# Patient Record
Sex: Female | Born: 1955 | Race: White | Hispanic: No | Marital: Single | Smoking: Former smoker
Health system: Southern US, Community
[De-identification: ages and names within clinical notes are randomized; demographics above are authoritative.]

## PROBLEM LIST (undated history)

## (undated) DIAGNOSIS — M1990 Unspecified osteoarthritis, unspecified site: Secondary | ICD-10-CM

## (undated) DIAGNOSIS — E559 Vitamin D deficiency, unspecified: Secondary | ICD-10-CM

## (undated) DIAGNOSIS — I10 Essential (primary) hypertension: Secondary | ICD-10-CM

## (undated) DIAGNOSIS — E785 Hyperlipidemia, unspecified: Secondary | ICD-10-CM

## (undated) DIAGNOSIS — R7303 Prediabetes: Secondary | ICD-10-CM

## (undated) DIAGNOSIS — K219 Gastro-esophageal reflux disease without esophagitis: Secondary | ICD-10-CM

## (undated) HISTORY — PX: APPENDECTOMY: SHX54

## (undated) HISTORY — DX: Unspecified osteoarthritis, unspecified site: M19.90

## (undated) HISTORY — DX: Hyperlipidemia, unspecified: E78.5

## (undated) HISTORY — DX: Prediabetes: R73.03

## (undated) HISTORY — DX: Essential (primary) hypertension: I10

## (undated) HISTORY — DX: Gastro-esophageal reflux disease without esophagitis: K21.9

## (undated) HISTORY — DX: Vitamin D deficiency, unspecified: E55.9

---

## 1997-08-14 ENCOUNTER — Other Ambulatory Visit: Admission: RE | Admit: 1997-08-14 | Discharge: 1997-08-14 | Payer: Self-pay | Admitting: Obstetrics and Gynecology

## 1997-09-05 ENCOUNTER — Other Ambulatory Visit: Admission: RE | Admit: 1997-09-05 | Discharge: 1997-09-05 | Payer: Self-pay | Admitting: Obstetrics and Gynecology

## 1999-02-06 ENCOUNTER — Emergency Department (HOSPITAL_COMMUNITY): Admission: EM | Admit: 1999-02-06 | Discharge: 1999-02-06 | Payer: Self-pay | Admitting: Emergency Medicine

## 1999-05-11 ENCOUNTER — Emergency Department (HOSPITAL_COMMUNITY): Admission: EM | Admit: 1999-05-11 | Discharge: 1999-05-11 | Payer: Self-pay | Admitting: *Deleted

## 2002-07-18 ENCOUNTER — Encounter: Payer: Self-pay | Admitting: Internal Medicine

## 2002-07-18 ENCOUNTER — Encounter: Admission: RE | Admit: 2002-07-18 | Discharge: 2002-07-18 | Payer: Self-pay | Admitting: Internal Medicine

## 2007-12-25 ENCOUNTER — Ambulatory Visit: Payer: Self-pay | Admitting: Pulmonary Disease

## 2007-12-25 DIAGNOSIS — J90 Pleural effusion, not elsewhere classified: Secondary | ICD-10-CM | POA: Insufficient documentation

## 2007-12-25 DIAGNOSIS — J309 Allergic rhinitis, unspecified: Secondary | ICD-10-CM | POA: Insufficient documentation

## 2008-12-10 ENCOUNTER — Other Ambulatory Visit: Admission: RE | Admit: 2008-12-10 | Discharge: 2008-12-10 | Payer: Self-pay | Admitting: Internal Medicine

## 2009-01-01 ENCOUNTER — Ambulatory Visit: Payer: Self-pay | Admitting: Gastroenterology

## 2009-02-05 LAB — HM COLONOSCOPY

## 2009-03-13 ENCOUNTER — Encounter: Admission: RE | Admit: 2009-03-13 | Discharge: 2009-03-13 | Payer: Self-pay | Admitting: Internal Medicine

## 2009-03-13 LAB — HM DEXA SCAN

## 2009-03-13 LAB — HM MAMMOGRAPHY: HM Mammogram: NORMAL

## 2010-08-18 IMAGING — CR DG CHEST 2V
2 series · 2 of 2 positions shown · non-contrast
Comparison: None

CLINICAL DATA: Cough, smoking history

CHEST - 2 VIEW

[view not recorded (1 of 2)]
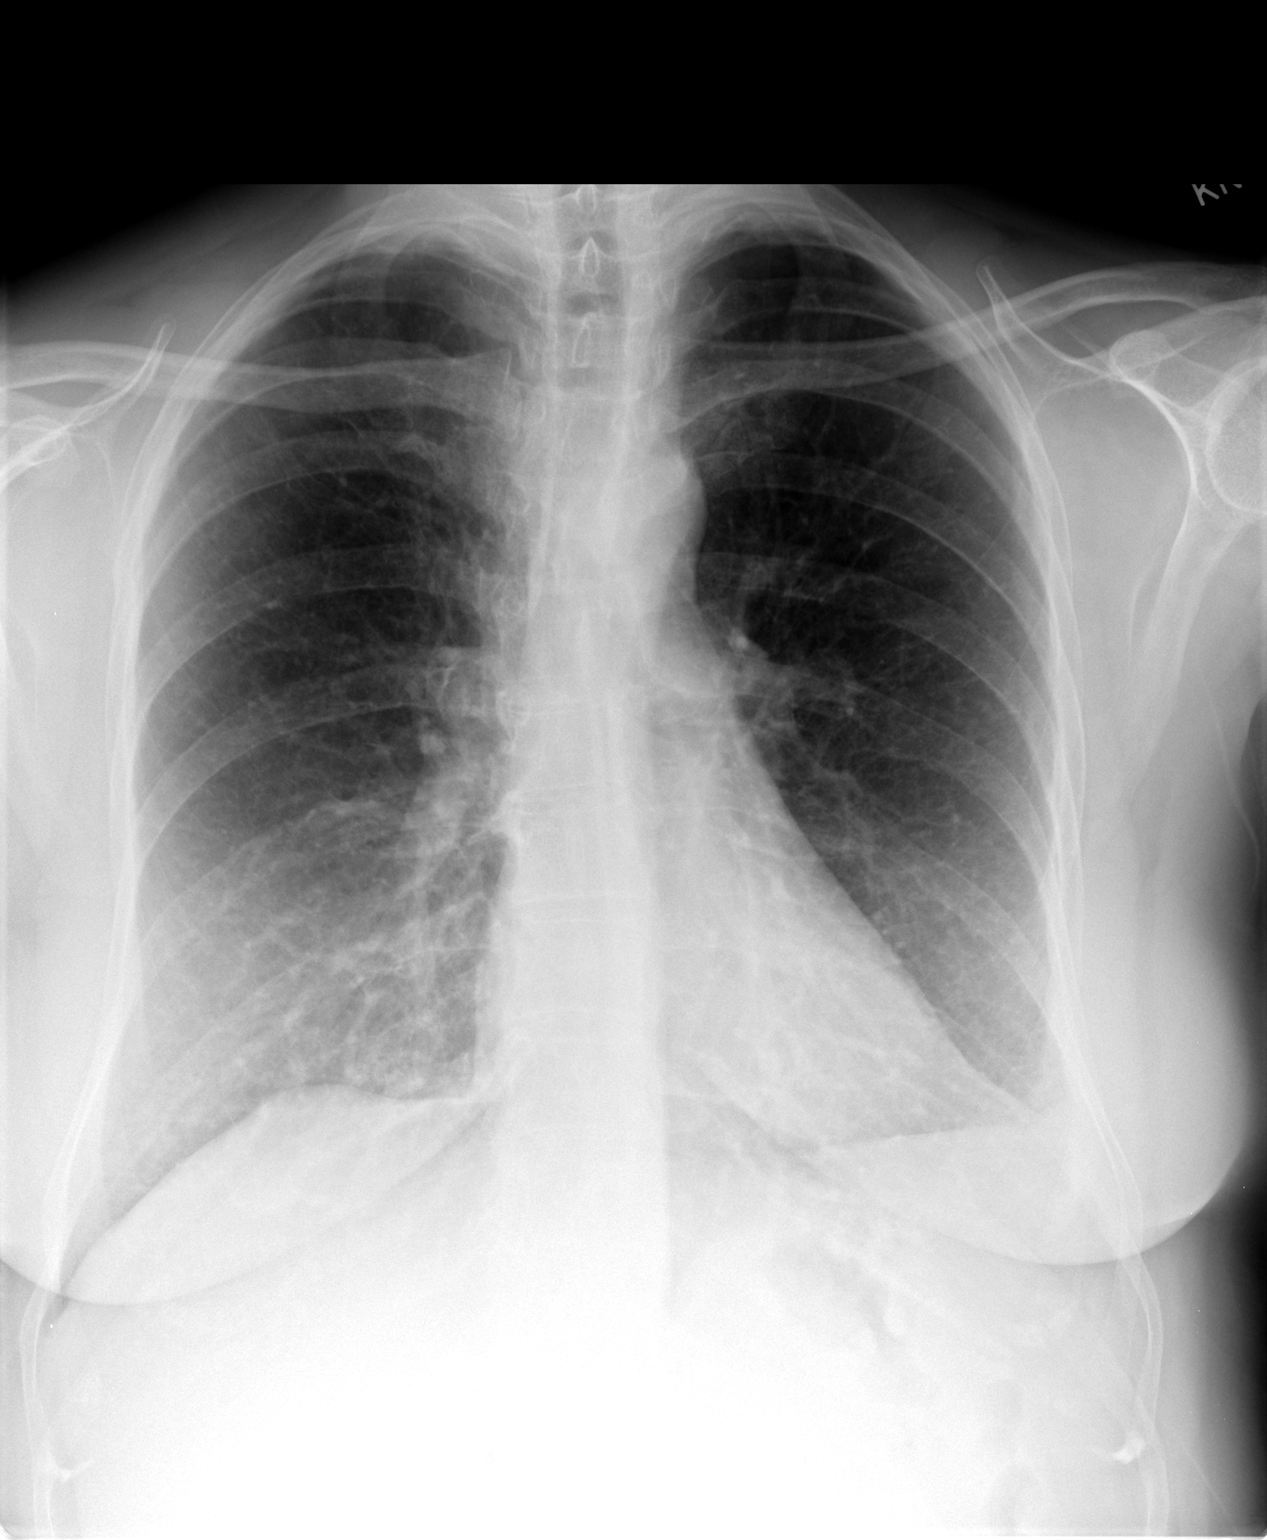

[view not recorded (2 of 2)]
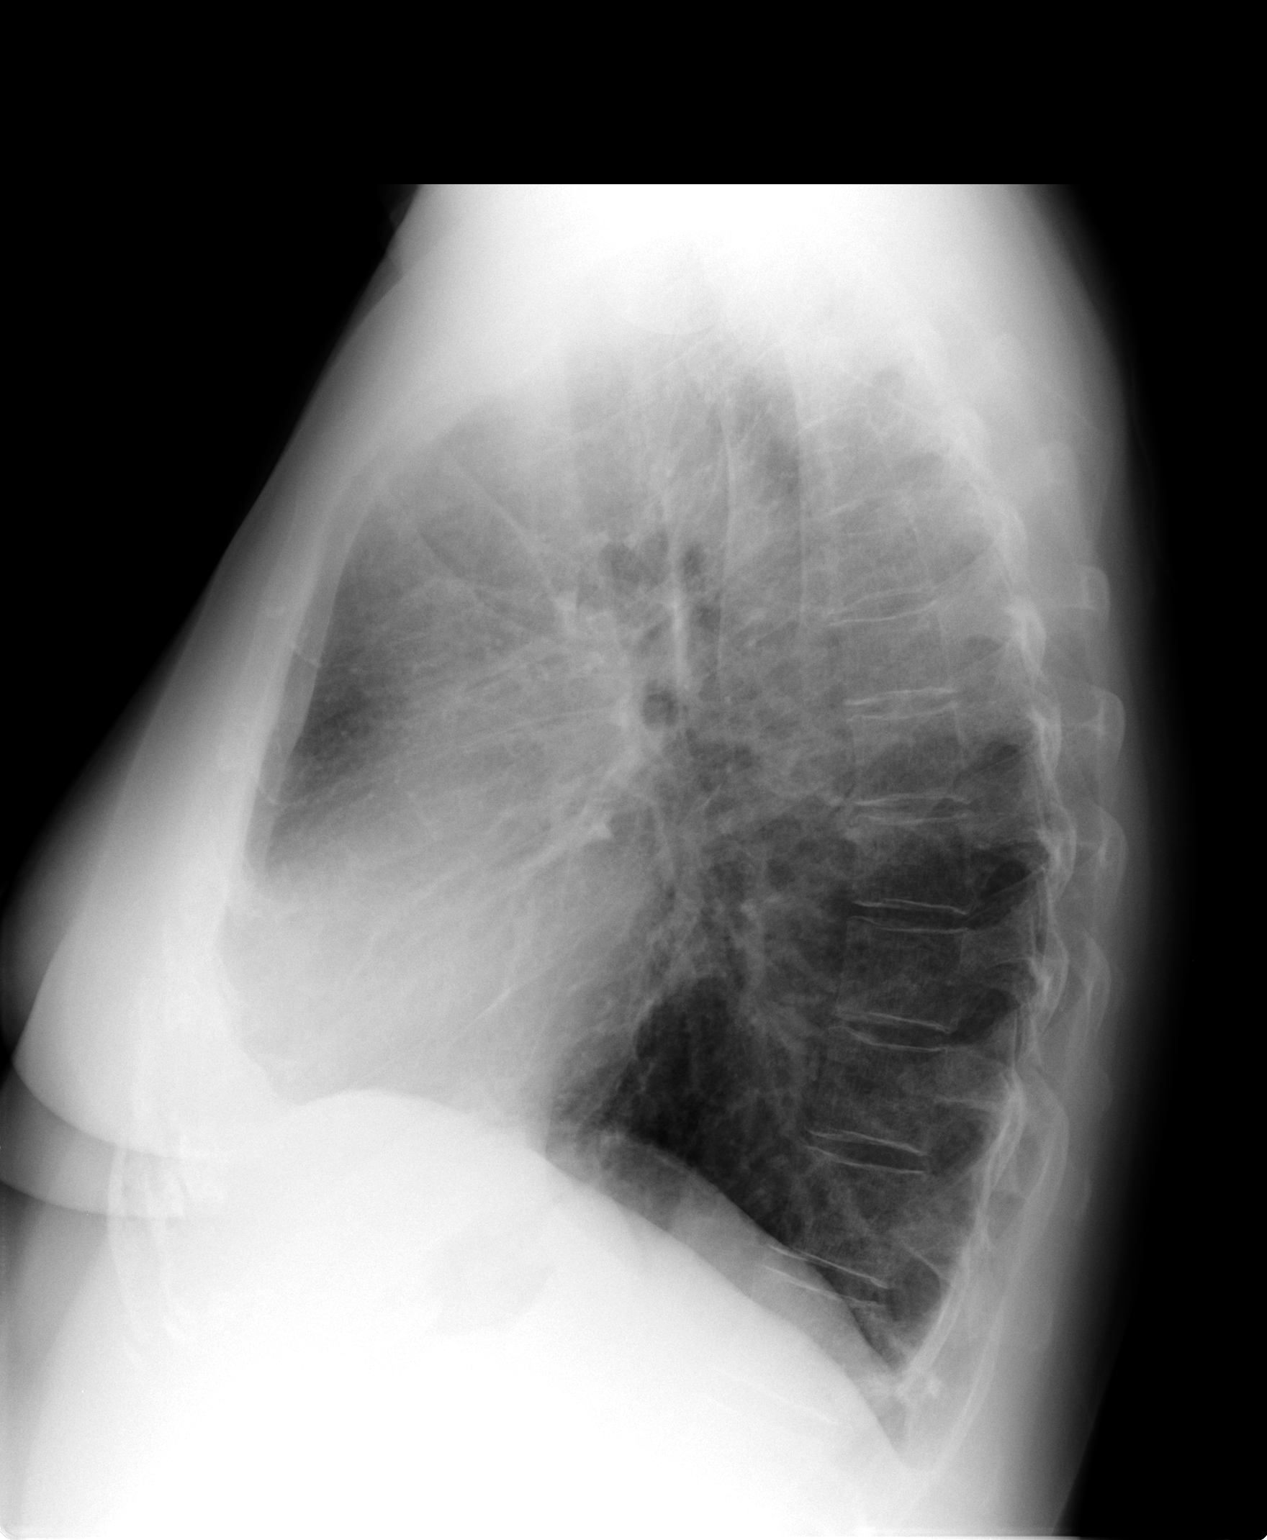

[2 of 2 positions shown; findings below may reference images not displayed]

FINDINGS: Two views of the chest show the lungs to be clear and
slightly hyperaerated.  The heart is within normal limits in size.
No bony abnormality is seen.
IMPRESSION: Slight hyperaeration.  No active lung disease.

## 2013-05-06 DIAGNOSIS — R7303 Prediabetes: Secondary | ICD-10-CM | POA: Insufficient documentation

## 2013-05-06 DIAGNOSIS — E559 Vitamin D deficiency, unspecified: Secondary | ICD-10-CM | POA: Insufficient documentation

## 2013-05-06 DIAGNOSIS — I10 Essential (primary) hypertension: Secondary | ICD-10-CM | POA: Insufficient documentation

## 2013-05-06 DIAGNOSIS — K219 Gastro-esophageal reflux disease without esophagitis: Secondary | ICD-10-CM | POA: Insufficient documentation

## 2013-05-06 DIAGNOSIS — E785 Hyperlipidemia, unspecified: Secondary | ICD-10-CM | POA: Insufficient documentation

## 2013-05-09 ENCOUNTER — Encounter: Payer: Self-pay | Admitting: Internal Medicine

## 2016-02-18 ENCOUNTER — Other Ambulatory Visit: Payer: Self-pay | Admitting: Family

## 2016-02-18 DIAGNOSIS — Z1231 Encounter for screening mammogram for malignant neoplasm of breast: Secondary | ICD-10-CM

## 2016-07-15 DIAGNOSIS — Z8719 Personal history of other diseases of the digestive system: Secondary | ICD-10-CM | POA: Insufficient documentation

## 2016-07-15 DIAGNOSIS — R5383 Other fatigue: Secondary | ICD-10-CM | POA: Insufficient documentation

## 2016-07-15 DIAGNOSIS — Z79899 Other long term (current) drug therapy: Secondary | ICD-10-CM | POA: Insufficient documentation

## 2016-07-15 DIAGNOSIS — M0579 Rheumatoid arthritis with rheumatoid factor of multiple sites without organ or systems involvement: Secondary | ICD-10-CM | POA: Insufficient documentation

## 2016-07-15 DIAGNOSIS — F5101 Primary insomnia: Secondary | ICD-10-CM | POA: Insufficient documentation

## 2016-07-15 DIAGNOSIS — R768 Other specified abnormal immunological findings in serum: Secondary | ICD-10-CM | POA: Insufficient documentation

## 2016-07-15 NOTE — Progress Notes (Signed)
Office Visit Note  Patient: Emma Warner             Date of Birth: 06-Mar-1956           MRN: [id number]             PCP: Lizbeth Bark, NP Referring: Smothers, Cathleen Corti, NP Visit Date: 07/16/2016 Occupation: @GUAROCC @    Subjective:  Pain in multiple joints   History of Present Illness: Emma Warner is a 61 y.o. female seen in consultation per request of her PCP. According to patient in July 2017 while she was walking her dog she did had a pole on her right shoulder. She was seen by orthopedic surgeons who suspected partial rotator cuff tear. She was given a cortisone injection and the symptoms gradually settle down but did not completely resolve. In October 2017 she started experiencing increased joint pain and swelling in her right wrist joint. She was given prednisone on and off for that. In November 2017 while she was driving to [place] she has increased joint pain and discomfort in her hands and her feet and had developed swelling on her bilateral lower extremities for which she had to get compression stockings. By this time she was also experiencing some neck pain. When she came back to [place] she saw her PCP and was given a prednisone Dosepak. She also had an appointment with Dr. Kellie Simmering who after evaluation started her on prednisone and gave her shingles vaccine. Methotrexate was added in February 2017 she was initially on 6 tablets per week and increase it to 8 tablets per week about 6 weeks ago. She has noticed some improvement in her symptoms. She still has discomfort in her hands and feet. She also complains of fatigue. There is a strong family history of rheumatoid arthritis. She is currently recovering from an upper respiratory tract infection  Activities of Daily Living:  Patient reports morning stiffness for 1 hour.   Patient Denies nocturnal pain.  Difficulty dressing/grooming: Denies Difficulty climbing stairs: Denies Difficulty getting out of chair:  Denies Difficulty using hands for taps, buttons, cutlery, and/or writing: Denies   Review of Systems  Constitutional: Positive for fatigue. Negative for night sweats, weight gain, weight loss and weakness.  HENT: Negative for mouth sores, trouble swallowing, trouble swallowing, mouth dryness and nose dryness.   Eyes: Negative for pain, redness, visual disturbance and dryness.  Respiratory: Negative for cough, shortness of breath and difficulty breathing.   Cardiovascular: Negative for chest pain, palpitations, hypertension, irregular heartbeat and swelling in legs/feet.  Gastrointestinal: Negative for blood in stool, constipation and diarrhea.  Endocrine: Negative for increased urination.  Genitourinary: Negative for vaginal dryness.  Musculoskeletal: Positive for arthralgias, joint pain, joint swelling and morning stiffness. Negative for myalgias, muscle weakness, muscle tenderness and myalgias.  Skin: Negative for color change, rash, hair loss, skin tightness, ulcers and sensitivity to sunlight.  Allergic/Immunologic: Negative for susceptible to infections.  Neurological: Negative for dizziness, memory loss and night sweats.  Hematological: Negative for swollen glands.  Psychiatric/Behavioral: Positive for sleep disturbance. Negative for depressed mood. The patient is not nervous/anxious.     PMFS History:  Patient Active Problem List   Diagnosis Date Noted  . Primary osteoarthritis of both hands 07/16/2016  . Rheumatoid arthritis involving multiple sites with positive rheumatoid factor (HCC) 07/15/2016  . ANA positive 07/15/2016  . History of gastroesophageal reflux (GERD) 07/15/2016  . Ds DNA antibody positive 07/15/2016  . Other fatigue 07/15/2016  .  High risk medication use 07/15/2016  . Primary insomnia 07/15/2016  . Hypertension   . Hyperlipidemia   . GERD (gastroesophageal reflux disease)   . Prediabetes   . Vitamin D deficiency   . ALLERGIC RHINITIS 12/25/2007  .  PLEURAL EFFUSION 12/25/2007    Past Medical History:  Diagnosis Date  . Arthritis   . GERD (gastroesophageal reflux disease)   . Hyperlipidemia   . Hypertension   . Prediabetes   . Vitamin D deficiency     Family History  Problem Relation Age of Onset  . Diabetes Mother   . Cancer Father     colon  . Cirrhosis Brother     alcoholic   Past Surgical History:  Procedure Laterality Date  . APPENDECTOMY     Social History   Social History Narrative  . No narrative on file     Objective: Vital Signs: BP 130/74 (BP Location: Right Arm)   Pulse 74   Resp 14   Ht 5\' 7"  (1.702 m)   Wt 170 lb (77.1 kg)   BMI 26.63 kg/m    Physical Exam  Constitutional: She is oriented to person, place, and time. She appears well-developed and well-nourished.  HENT:  Head: Normocephalic and atraumatic.  Eyes: Conjunctivae and EOM are normal.  Neck: Normal range of motion.  Cardiovascular: Normal rate, regular rhythm, normal heart sounds and intact distal pulses.   Pulmonary/Chest: Effort normal and breath sounds normal.  Abdominal: Soft. Bowel sounds are normal.  Lymphadenopathy:    She has no cervical adenopathy.  Neurological: She is alert and oriented to person, place, and time.  Skin: Skin is warm and dry. Capillary refill takes less than 2 seconds.  Psychiatric: She has a normal mood and affect. Her behavior is normal.  Nursing note and vitals reviewed.    Musculoskeletal Exam: C-spine and thoracic lumbar spine good range of motion. Shoulder joints although joints wrist joint MCPs PIPs DIPs are good range of motion with no synovitis. She is some prominence of bilateral CMC and DIP PIP joints consistent with osteoarthritis. Hip joints knee joints ankles MTPs PIPs DIPs with good range of motion with no synovitis. She is prominence of bilateral first MTP and some PIP/DIP joints in her feet consistent with osteoarthritis.  CDAI Exam: CDAI Homunculus Exam:   Joint Counts:  CDAI  Tender Joint count: 0 CDAI Swollen Joint count: 0  Global Assessments:  Patient Global Assessment: 3 Provider Global Assessment: 3  CDAI Calculated Score: 6    Investigation: Findings:  02/13/2016 lipid panel LDL 142 HDL 65 ,TSHnormal 1.610; Hep C Ab Non reactive; Uric Acid 5.1;Sed rate 30; CBC shows WBC 11.7; UA normal; positive ANA (no titer) ds DNA Ab positive 99 (otherwise ENA panel negative)  Patient got x-rays from Dr. Ines Bloomer office I do not have a date on it x-ray of the right foot shows right first MTP PIP/DIP narrowing no other MTP narrowing or erosive changes were noted. X-ray of bilateral hands from 02/23/2016 showed mild PIP/DIP narrowing no MCP changes noted erosive changes, no intercarpal joint space changes.    Imaging: No results found.  Speciality Comments: No specialty comments available.    Procedures:  No procedures performed Allergies: Patient has no known allergies.   Assessment / Plan:     Visit Diagnoses: Rheumatoid arthritis involving multiple sites with positive rheumatoid factor (HCC) -patient has been treated by Dr. Kellie Simmering and has been on methotrexate 8 tablets per week along with prednisone. She had no  synovitis on examination today. I would like to taper her prednisone. We had detailed discussion regarding that she is currently on 5 mg by mouth daily I've advised her to decrease her prednisone to 2.5 mg by mouth daily for a month and then 2.5 mg every other day and discontinue. If she has any flare of her symptoms she supposed to notify us. I discussed the option of subcutaneous methotrexate or adding Enbrel in case she has a flare. Her uncle is currently taking Enbrel and is quite pleased with that. Plan: Rheumatoid factor, Cyclic citrul peptide antibody, IgG  High risk medication use - Methotrexate 8 tabs po q wk, folic acid 1 mg po qd, prednisone5 mg po qd -I have advised to increase her dose of folic acid to 2 mg by mouth daily. I'll obtain some  baseline labs today. We will also get informed consent on methotrexate today. Plan: CBC with Differential/Platelet, COMPLETE METABOLIC PANEL WITH GFR, Quantiferon tb gold assay (blood), Serum protein electrophoresis with reflex, HIV antibody, Hepatitis B core antibody, IgM, Hepatitis B surface antigen, CBC with Differential/Platelet, COMPLETE METABOLIC PANEL WITH GFR  Primary osteoarthritis of both hands: She has mild changes which cause some discomfort.  Primary osteoarthritis of both feet: She is some discomfort in her feet as well.  ANA positive: She does not have any clinical features of lupus  Ds DNA antibody positive: She is significantly high titer of double-stranded DNA. At this time I do not see any other clinical features of lupus. - Plan: Lupus anticoagulant panel, Beta-2 glycoprotein antibodies, Cardiolipin antibodies, IgG, IgM, IgA  Other fatigue - Plan: Anti-DNA antibody, double-stranded, C3 and C4, CK, IgG, IgA, IgM  Primary insomnia [place] good sleep hygiene was discussed.  History of gastroesophageal reflux (GERD)  Vitamin D deficiency  History of hypertension  History of pleural effusion /2009 - related to pneumonia  Family history of rheumatoid arthritis - Mother, maternal uncle 2 second cousins    Orders: Orders Placed This Encounter  Procedures  . CBC with Differential/Platelet  . COMPLETE METABOLIC PANEL WITH GFR  . Rheumatoid factor  . Cyclic citrul peptide antibody, IgG  . Quantiferon tb gold assay (blood)  . Serum protein electrophoresis with reflex  . Anti-DNA antibody, double-stranded  . C3 and C4  . Lupus anticoagulant panel  . Beta-2 glycoprotein antibodies  . Cardiolipin antibodies, IgG, IgM, IgA  . CK  . IgG, IgA, IgM  . HIV antibody  . Hepatitis B core antibody, IgM  . Hepatitis B surface antigen  . CBC with Differential/Platelet  . COMPLETE METABOLIC PANEL WITH GFR   Meds ordered this encounter  Medications  . folic acid (FOLVITE) 1 MG  tablet    Sig: Take 2 tablets (2 mg total) by mouth daily.    Dispense:  180 tablet    Refill:  3  . predniSONE (DELTASONE) 5 MG tablet    Sig: Take 2.5 mg PO QD for one month, then 2.5 mg PO every other day for one month, then discontinue    Dispense:  45 tablet    Refill:  0    Face-to-face time spent with patient was 60 minutes. 50% of time was spent in counseling and coordination of care.  Follow-Up Instructions: Return for Rheumatoid arthritis.   Pollyann SavoyShaili Akon Reinoso, MD  Note - This record has been created using [employer] software.  Chart creation errors have been sought, but may not always  have been located. Such creation errors do not reflect on  the  standard of medical care. 

## 2016-07-16 ENCOUNTER — Encounter: Payer: Self-pay | Admitting: Rheumatology

## 2016-07-16 ENCOUNTER — Ambulatory Visit (INDEPENDENT_AMBULATORY_CARE_PROVIDER_SITE_OTHER): Payer: 59 | Admitting: Rheumatology

## 2016-07-16 VITALS — BP 130/74 | HR 74 | Resp 14 | Ht 67.0 in | Wt 170.0 lb

## 2016-07-16 DIAGNOSIS — F5101 Primary insomnia: Secondary | ICD-10-CM

## 2016-07-16 DIAGNOSIS — R5383 Other fatigue: Secondary | ICD-10-CM | POA: Diagnosis not present

## 2016-07-16 DIAGNOSIS — R768 Other specified abnormal immunological findings in serum: Secondary | ICD-10-CM | POA: Diagnosis not present

## 2016-07-16 DIAGNOSIS — Z8719 Personal history of other diseases of the digestive system: Secondary | ICD-10-CM | POA: Diagnosis not present

## 2016-07-16 DIAGNOSIS — Z8679 Personal history of other diseases of the circulatory system: Secondary | ICD-10-CM

## 2016-07-16 DIAGNOSIS — E559 Vitamin D deficiency, unspecified: Secondary | ICD-10-CM

## 2016-07-16 DIAGNOSIS — Z8709 Personal history of other diseases of the respiratory system: Secondary | ICD-10-CM | POA: Diagnosis not present

## 2016-07-16 DIAGNOSIS — Z79899 Other long term (current) drug therapy: Secondary | ICD-10-CM | POA: Diagnosis not present

## 2016-07-16 DIAGNOSIS — M19072 Primary osteoarthritis, left ankle and foot: Secondary | ICD-10-CM

## 2016-07-16 DIAGNOSIS — M19071 Primary osteoarthritis, right ankle and foot: Secondary | ICD-10-CM | POA: Diagnosis not present

## 2016-07-16 DIAGNOSIS — M19042 Primary osteoarthritis, left hand: Secondary | ICD-10-CM

## 2016-07-16 DIAGNOSIS — M0579 Rheumatoid arthritis with rheumatoid factor of multiple sites without organ or systems involvement: Secondary | ICD-10-CM

## 2016-07-16 DIAGNOSIS — Z8261 Family history of arthritis: Secondary | ICD-10-CM | POA: Diagnosis not present

## 2016-07-16 DIAGNOSIS — M19041 Primary osteoarthritis, right hand: Secondary | ICD-10-CM | POA: Insufficient documentation

## 2016-07-16 LAB — CBC WITH DIFFERENTIAL/PLATELET
BASOS ABS: 0 {cells}/uL (ref 0–200)
Basophils Relative: 0 %
EOS ABS: 92 {cells}/uL (ref 15–500)
Eosinophils Relative: 1 %
HEMATOCRIT: 39.3 % (ref 35.0–45.0)
HEMOGLOBIN: 13.2 g/dL (ref 11.7–15.5)
Lymphocytes Relative: 15 %
Lymphs Abs: 1380 cells/uL (ref 850–3900)
MCH: 30.1 pg (ref 27.0–33.0)
MCHC: 33.6 g/dL (ref 32.0–36.0)
MCV: 89.5 fL (ref 80.0–100.0)
MPV: 10 fL (ref 7.5–12.5)
Monocytes Absolute: 460 cells/uL (ref 200–950)
Monocytes Relative: 5 %
Neutro Abs: 7268 cells/uL (ref 1500–7800)
Neutrophils Relative %: 79 %
Platelets: 377 10*3/uL (ref 140–400)
RBC: 4.39 MIL/uL (ref 3.80–5.10)
RDW: 15.7 % — ABNORMAL HIGH (ref 11.0–15.0)
WBC: 9.2 10*3/uL (ref 3.8–10.8)

## 2016-07-16 MED ORDER — FOLIC ACID 1 MG PO TABS: 2.0000 mg | ORAL_TABLET | Freq: Every day | ORAL | 3 refills | Status: DC

## 2016-07-16 MED ORDER — PREDNISONE 5 MG PO TABS: ORAL_TABLET | ORAL | 0 refills | Status: DC

## 2016-07-16 NOTE — Patient Instructions (Addendum)
Decrease prednisone dose to 2.5 mg (1/2 of a 5 mg tablet) daily for one month, then reduce to 2.5 mg every other day for a month, then stop.   Continue methotrexate 8 tablets (20 mg) weekly  Increase folic acid dose to 2 mg (2 tablets) daily  Standing Labs We placed an order today for your standing lab work.    Please come back and get your standing labs in August and every 3 months  We have open lab Monday through Friday from 8:30-11:30 AM and 1:30-4 PM at the office of Dr. Arbutus PedShaili Morad Tal/Naitik Panwala, PA.   The office is located at [address], [address], [place], [place] [address] No appointment is necessary.   Labs are drawn by [facility].  You may receive a bill from [facility] for your lab work.    Methotrexate tablets What is this medicine? METHOTREXATE (METH oh TREX ate) is a chemotherapy drug used to treat cancer including breast cancer, leukemia, and lymphoma. This medicine can also be used to treat psoriasis and certain kinds of arthritis. This medicine may be used for other purposes; ask your health care provider or pharmacist if you have questions. COMMON BRAND NAME(S): Rheumatrex, Trexall What should I tell my health care provider before I take this medicine? They need to know if you have any of these conditions: -fluid in the stomach area or lungs -if you often drink alcohol -infection or immune system problems -kidney disease or on hemodialysis -liver disease -low blood counts, like low white cell, platelet, or red cell counts -lung disease -radiation therapy -stomach ulcers -ulcerative colitis -an unusual or allergic reaction to methotrexate, other medicines, foods, dyes, or preservatives -pregnant or trying to get pregnant -breast-feeding How should I use this medicine? Take this medicine by mouth with a glass of water. Follow the directions on the prescription label. Take your medicine at regular intervals. Do not take it more often than directed. Do not stop  taking except on your doctor's advice. Make sure you know why you are taking this medicine and how often you should take it. If this medicine is used for a condition that is not cancer, like arthritis or psoriasis, it should be taken weekly, NOT daily. Taking this medicine more often than directed can cause serious side effects, even death. Talk to your healthcare provider about safe handling and disposal of this medicine. You may need to take special precautions. Talk to your pediatrician regarding the use of this medicine in children. While this drug may be prescribed for selected conditions, precautions do apply. Overdosage: If you think you have taken too much of this medicine contact a poison control center or emergency room at once. NOTE: This medicine is only for you. Do not share this medicine with others. What if I miss a dose? If you miss a dose, talk with your doctor or health care professional. Do not take double or extra doses. What may interact with this medicine? This medicine may interact with the following medication: -acitretin -aspirin and aspirin-like medicines including salicylates -azathioprine -certain antibiotics like penicillins, tetracycline, and chloramphenicol -cyclosporine -gold -hydroxychloroquine -live virus vaccines -NSAIDs, medicines for pain and inflammation, like ibuprofen or naproxen -other cytotoxic agents -penicillamine -phenylbutazone -phenytoin -probenecid -retinoids such as isotretinoin and tretinoin -steroid medicines like prednisone or cortisone -sulfonamides like sulfasalazine and trimethoprim/sulfamethoxazole -theophylline This list may not describe all possible interactions. Give your health care provider a list of all the medicines, herbs, non-prescription drugs, or dietary supplements you use. Also tell  them if you smoke, drink alcohol, or use illegal drugs. Some items may interact with your medicine. What should I watch for while using  this medicine? Avoid alcoholic drinks. This medicine can make you more sensitive to the sun. Keep out of the sun. If you cannot avoid being in the sun, wear protective clothing and use sunscreen. Do not use sun lamps or tanning beds/booths. You may need blood work done while you are taking this medicine. Call your doctor or health care professional for advice if you get a fever, chills or sore throat, or other symptoms of a cold or flu. Do not treat yourself. This drug decreases your body's ability to fight infections. Try to avoid being around people who are sick. This medicine may increase your risk to bruise or bleed. Call your doctor or health care professional if you notice any unusual bleeding. Check with your doctor or health care professional if you get an attack of severe diarrhea, nausea and vomiting, or if you sweat a lot. The loss of too much body fluid can make it dangerous for you to take this medicine. Talk to your doctor about your risk of cancer. You may be more at risk for certain types of cancers if you take this medicine. Both men and women must use effective birth control with this medicine. Do not become pregnant while taking this medicine or until at least 1 normal menstrual cycle has occurred after stopping it. Women should inform their doctor if they wish to become pregnant or think they might be pregnant. Men should not father a child while taking this medicine and for 3 months after stopping it. There is a potential for serious side effects to an unborn child. Talk to your health care professional or pharmacist for more information. Do not breast-feed an infant while taking this medicine. What side effects may I notice from receiving this medicine? Side effects that you should report to your doctor or health care professional as soon as possible: -allergic reactions like skin rash, itching or hives, swelling of the face, lips, or tongue -breathing problems or shortness of  breath -diarrhea -dry, nonproductive cough -low blood counts - this medicine may decrease the number of white blood cells, red blood cells and platelets. You may be at increased risk for infections and bleeding. -mouth sores -redness, blistering, peeling or loosening of the skin, including inside the mouth -signs of infection - fever or chills, cough, sore throat, pain or trouble passing urine -signs and symptoms of bleeding such as bloody or black, tarry stools; red or dark-brown urine; spitting up blood or brown material that looks like coffee grounds; red spots on the skin; unusual bruising or bleeding from the eye, gums, or nose -signs and symptoms of kidney injury like trouble passing urine or change in the amount of urine -signs and symptoms of liver injury like dark yellow or brown urine; general ill feeling or flu-like symptoms; light-colored stools; loss of appetite; nausea; right upper belly pain; unusually weak or tired; yellowing of the eyes or skin Side effects that usually do not require medical attention (report to your doctor or health care professional if they continue or are bothersome): -dizziness -hair loss -tiredness -upset stomach -vomiting This list may not describe all possible side effects. Call your doctor for medical advice about side effects. You may report side effects to FDA at 1-800-FDA-1088. Where should I keep my medicine? Keep out of the reach of children. Store at room temperature between 20  and 25 degrees C (68 and 77 degrees F). Protect from light. Throw away any unused medicine after the expiration date. NOTE: This sheet is a summary. It may not cover all possible information. If you have questions about this medicine, talk to your doctor, pharmacist, or health care provider.  2018 Elsevier/Gold Standard (2014-11-04 05:39:22)    Prednisone tablets What is this medicine? PREDNISONE (PRED ni sone) is a corticosteroid. It is commonly used to treat  inflammation of the skin, joints, lungs, and other organs. Common conditions treated include asthma, allergies, and arthritis. It is also used for other conditions, such as blood disorders and diseases of the adrenal glands. This medicine may be used for other purposes; ask your health care provider or pharmacist if you have questions. COMMON BRAND NAME(S): Deltasone, Predone, Sterapred, Sterapred DS What should I tell my health care provider before I take this medicine? They need to know if you have any of these conditions: -Cushing's syndrome -diabetes -glaucoma -heart disease -high blood pressure -infection (especially a virus infection such as chickenpox, cold sores, or herpes) -kidney disease -liver disease -mental illness -myasthenia gravis -osteoporosis -seizures -stomach or intestine problems -thyroid disease -an unusual or allergic reaction to lactose, prednisone, other medicines, foods, dyes, or preservatives -pregnant or trying to get pregnant -breast-feeding How should I use this medicine? Take this medicine by mouth with a glass of water. Follow the directions on the prescription label. Take this medicine with food. If you are taking this medicine once a day, take it in the morning. Do not take more medicine than you are told to take. Do not suddenly stop taking your medicine because you may develop a severe reaction. Your doctor will tell you how much medicine to take. If your doctor wants you to stop the medicine, the dose may be slowly lowered over time to avoid any side effects. Talk to your pediatrician regarding the use of this medicine in children. Special care may be needed. Overdosage: If you think you have taken too much of this medicine contact a poison control center or emergency room at once. NOTE: This medicine is only for you. Do not share this medicine with others. What if I miss a dose? If you miss a dose, take it as soon as you can. If it is almost time for  your next dose, talk to your doctor or health care professional. You may need to miss a dose or take an extra dose. Do not take double or extra doses without advice. What may interact with this medicine? Do not take this medicine with any of the following medications: -metyrapone -mifepristone This medicine may also interact with the following medications: -aminoglutethimide -amphotericin B -aspirin and aspirin-like medicines -barbiturates -certain medicines for diabetes, like glipizide or glyburide -cholestyramine -cholinesterase inhibitors -cyclosporine -digoxin -diuretics -ephedrine -female hormones, like estrogens and birth control pills -isoniazid -ketoconazole -NSAIDS, medicines for pain and inflammation, like ibuprofen or naproxen -phenytoin -rifampin -toxoids -vaccines -warfarin This list may not describe all possible interactions. Give your health care provider a list of all the medicines, herbs, non-prescription drugs, or dietary supplements you use. Also tell them if you smoke, drink alcohol, or use illegal drugs. Some items may interact with your medicine. What should I watch for while using this medicine? Visit your doctor or health care professional for regular checks on your progress. If you are taking this medicine over a prolonged period, carry an identification card with your name and address, the type and dose of your  medicine, and your doctor's name and address. This medicine may increase your risk of getting an infection. Tell your doctor or health care professional if you are around anyone with measles or chickenpox, or if you develop sores or blisters that do not heal properly. If you are going to have surgery, tell your doctor or health care professional that you have taken this medicine within the last twelve months. Ask your doctor or health care professional about your diet. You may need to lower the amount of salt you eat. This medicine may affect blood  sugar levels. If you have diabetes, check with your doctor or health care professional before you change your diet or the dose of your diabetic medicine. What side effects may I notice from receiving this medicine? Side effects that you should report to your doctor or health care professional as soon as possible: -allergic reactions like skin rash, itching or hives, swelling of the face, lips, or tongue -changes in emotions or moods -changes in vision -depressed mood -eye pain -fever or chills, cough, sore throat, pain or difficulty passing urine -increased thirst -swelling of ankles, feet Side effects that usually do not require medical attention (report to your doctor or health care professional if they continue or are bothersome): -confusion, excitement, restlessness -headache -nausea, vomiting -skin problems, acne, thin and shiny skin -trouble sleeping -weight gain This list may not describe all possible side effects. Call your doctor for medical advice about side effects. You may report side effects to FDA at 1-800-FDA-1088. Where should I keep my medicine? Keep out of the reach of children. Store at room temperature between 15 and 30 degrees C (59 and 86 degrees F). Protect from light. Keep container tightly closed. Throw away any unused medicine after the expiration date. NOTE: This sheet is a summary. It may not cover all possible information. If you have questions about this medicine, talk to your doctor, pharmacist, or health care provider.  2018 Elsevier/Gold Standard (2010-10-15 10:57:14)

## 2016-07-16 NOTE — Progress Notes (Signed)
Pharmacy Note  Subjective: Patient presents today to the [facility] to see Dr. Corliss Skainseveshwar.  Patient is currently taking methotrexate 8 tablets weekly and folic acid 1 mg daily.  Patient seen by the pharmacist for counseling on methotrexate.    Objective: CBC, CMP ordered today TB Gold: ordered today Hepatitis panel: ordered today HIV: ordered today  Chest-xray:  12/25/2007 "IMPRESSION: Slight hyperaeration. No active lung disease."    Contraception: post-menopausal   Alcohol use: rarely  Assessment/Plan:  Patient was counseled on the purpose, proper use, and adverse effects of methotrexate including nausea, infection, and signs and symptoms of pneumonitis.  Reviewed instructions with patient to take methotrexate weekly along with folic acid 2 mg daily.  Discussed the importance of frequent monitoring of kidney and liver function and blood counts, and provided patient with standing lab instructions.  Counseled patient to avoid NSAIDs and alcohol while on methotrexate.  Provided patient with educational materials on methotrexate and answered all questions.  Advised patient to get annual influenza vaccine and to get a pneumococcal vaccine if patient has not already had one.  Patient confirms she has had a pneumonia vaccine in the past and had the shingles vaccine before she initiated methotrexate.  Patient consented to methotrexate use.  Will upload into chart.    Lilla Shookachel Cathryn Gallery, Pharm.D., BCPS Clinical Pharmacist Pager: [phone] Phone: [phone] 07/16/2016 9:52 AM

## 2016-07-17 LAB — COMPLETE METABOLIC PANEL WITH GFR
ALBUMIN: 4.5 g/dL (ref 3.6–5.1)
ALK PHOS: 68 U/L (ref 33–130)
ALT: 22 U/L (ref 6–29)
AST: 16 U/L (ref 10–35)
BILIRUBIN TOTAL: 0.3 mg/dL (ref 0.2–1.2)
BUN: 15 mg/dL (ref 7–25)
CO2: 22 mmol/L (ref 20–31)
Calcium: 9.8 mg/dL (ref 8.6–10.4)
Chloride: 103 mmol/L (ref 98–110)
Creat: 0.81 mg/dL (ref 0.50–0.99)
GFR, EST NON AFRICAN AMERICAN: 79 mL/min (ref >=60)
GLUCOSE: 107 mg/dL — AB (ref 65–99)
Potassium: 4.4 mmol/L (ref 3.5–5.3)
SODIUM: 142 mmol/L (ref 135–146)
TOTAL PROTEIN: 6.9 g/dL (ref 6.1–8.1)

## 2016-07-17 LAB — HEPATITIS B SURFACE ANTIGEN: Hepatitis B Surface Ag: NEGATIVE

## 2016-07-17 LAB — HIV ANTIBODY (ROUTINE TESTING W REFLEX): HIV: NONREACTIVE

## 2016-07-17 LAB — CK: Total CK: 106 U/L (ref 7–177)

## 2016-07-17 LAB — HEPATITIS B CORE ANTIBODY, IGM: HEP B C IGM: NONREACTIVE

## 2016-07-19 LAB — C3 AND C4
C3 COMPLEMENT: 181 mg/dL (ref 83–193)
C4 COMPLEMENT: 42 mg/dL (ref 15–57)

## 2016-07-19 LAB — BETA-2 GLYCOPROTEIN ANTIBODIES
Beta-2 Glyco I IgG: 9 SGU (ref <=20)
Beta-2-Glycoprotein I IgA: <9 SAU (ref <=20)
Beta-2-Glycoprotein I IgM: <9 SMU (ref <=20)

## 2016-07-19 LAB — IGG, IGA, IGM
IGA: 141 mg/dL (ref 81–463)
IGG (IMMUNOGLOBIN G), SERUM: 768 mg/dL (ref 694–1618)
IGM, SERUM: 83 mg/dL (ref 48–271)

## 2016-07-19 LAB — RHEUMATOID FACTOR: Rhuematoid fact SerPl-aCnc: 15 IU/mL — ABNORMAL HIGH (ref <14)

## 2016-07-19 LAB — CARDIOLIPIN ANTIBODIES, IGG, IGM, IGA
Anticardiolipin IgA: <11 [APL'U]
Anticardiolipin IgG: <14 [GPL'U]
Anticardiolipin IgM: <12 [MPL'U]

## 2016-07-19 LAB — CYCLIC CITRUL PEPTIDE ANTIBODY, IGG: Cyclic Citrullin Peptide Ab: >250 Units — ABNORMAL HIGH

## 2016-07-20 LAB — QUANTIFERON TB GOLD ASSAY (BLOOD)
Mitogen-Nil: 0.25 IU/mL
Quantiferon Nil Value: 0.04 IU/mL
Quantiferon Tb Ag Minus Nil Value: 0 IU/mL

## 2016-07-20 LAB — RFX PTT-LA W/RFX TO HEX PHASE CONF: PTT-LA Screen: 31 s (ref <=40)

## 2016-07-20 LAB — RFX DRVVT SCR W/RFLX CONF 1:1 MIX: dRVVT Screen: 29 s (ref <=45)

## 2016-07-20 LAB — LUPUS ANTICOAGULANT PANEL

## 2016-07-20 LAB — ANTI-DNA ANTIBODY, DOUBLE-STRANDED: DS DNA AB: 78 [IU]/mL — AB

## 2016-07-21 LAB — PROTEIN ELECTROPHORESIS, SERUM, WITH REFLEX
ALBUMIN ELP: 4.3 g/dL (ref 3.8–4.8)
ALPHA-1-GLOBULIN: 0.3 g/dL (ref 0.2–0.3)
Alpha-2-Globulin: 0.8 g/dL (ref 0.5–0.9)
BETA 2: 0.3 g/dL (ref 0.2–0.5)
Beta Globulin: 0.4 g/dL (ref 0.4–0.6)
Gamma Globulin: 0.7 g/dL — ABNORMAL LOW (ref 0.8–1.7)
Total Protein, Serum Electrophoresis: 6.9 g/dL (ref 6.1–8.1)

## 2016-07-21 NOTE — Progress Notes (Signed)
Will need repeat TB gold

## 2016-07-22 ENCOUNTER — Other Ambulatory Visit: Payer: Self-pay | Admitting: *Deleted

## 2016-07-22 ENCOUNTER — Other Ambulatory Visit: Payer: Self-pay

## 2016-07-22 DIAGNOSIS — Z9225 Personal history of immunosupression therapy: Secondary | ICD-10-CM

## 2016-07-27 LAB — QUANTIFERON TB GOLD ASSAY (BLOOD)
Interferon Gamma Release Assay: NEGATIVE
MITOGEN-NIL SO: 3.69 [IU]/mL
QUANTIFERON TB AG MINUS NIL: 0.01 [IU]/mL
Quantiferon Nil Value: 0.03 IU/mL

## 2016-07-27 NOTE — Progress Notes (Signed)
Tb gold

## 2016-08-19 ENCOUNTER — Ambulatory Visit: Payer: Self-pay | Admitting: Rheumatology

## 2016-08-26 ENCOUNTER — Telehealth: Payer: Self-pay

## 2016-08-26 NOTE — Telephone Encounter (Signed)
I called patient to discuss injectable methotrexate.  She reports she is currently taking methotrexate 20 mg weekly (splitting dose).  She is tapering prednisone as directed and reports her symptoms are returning.  Dr. Corliss Skainseveshwar discussed injectable methotrexate for improved efficacy at last visit but patient was not ready to switch at the time.  She reports she is ready to consider injectable methotrexate now.    Discussed options of methotrexate vial and syringe versus pre-filled pens (Otrexup or Rasuvo).  Reviewed her insurance.  I do not believe either Otrexup or Rasuvo are on patient's formulary; however, patient wants to see if her insurance will approve a prefilled pen since she is nervous about using vial and syringe.  I advised we will apply for pre-filled pen and if approved will bring her by the office to educate her on how to use it.  If denied, we will bring her to office to teach her how to use vial and syringe.    Can you submit prior authorization for Rasuvo 20 mg pens?  Patient would use weekly injections.

## 2016-08-26 NOTE — Telephone Encounter (Signed)
Patient would like a call to discuss trying to take MTX Injection.  CB# is [phone].

## 2016-08-27 ENCOUNTER — Telehealth: Payer: Self-pay

## 2016-08-27 NOTE — Telephone Encounter (Signed)
A prior authorization for Rasuvo was submitted via cover my meds Will update once we receive a response.  Keeley Sussman, [place], CPhT 2:13 PM

## 2016-08-30 MED ORDER — METHOTREXATE (PF) 20 MG/0.4ML ~~LOC~~ SOAJ: 20.0000 mg | SUBCUTANEOUS | 2 refills | Status: DC

## 2016-08-30 NOTE — Telephone Encounter (Signed)
Patient called back. She would like her Rasuvo Rx to be sent to [facility]. She plans to pick up the medication tomorrow (08/31/16) and come in for a nursing visit to have the medication administered. Can we make a nursing visit for her tomorrow evening? Thanks  [place], [place], CPhT

## 2016-08-30 NOTE — Addendum Note (Signed)
Addended by: Chesley MiresHENDERSON, Lanier Millon L on: 08/30/2016 10:51 AM   Modules accepted: Orders

## 2016-08-30 NOTE — Telephone Encounter (Signed)
Prescription was sent to [facility].  I called patient to discuss.  Discussed patient coming by clinic for education on how to use the Rasuvo pen.  She wants to administer first dose in clinic.  Patient confirms last methotrexate dose was Wednesday, 08/25/16.  Nurse visit scheduled for Wednesday, 09/01/16 at 9:00 AM for Rasuvo injection.    Lilla Shookachel Walden Statz, Pharm.D., BCPS, CPP Clinical Pharmacist Pager: [phone] Phone: [phone] 08/30/2016 10:50 AM

## 2016-08-30 NOTE — Telephone Encounter (Signed)
Received a fax from [employer] regarding a prior authorization approval for Rasuvo from 08/27/16 to 07/27/17.   Reference number:[id number]  Will send document to scan center.   Spoke to patient to update her. Gave her a co-pay coupon over the phone (BIN: [id number], ID: [id number], Group: [id number]) She will need to come by the clinic to be shown how to use the pen. Patient will call [employer] to see if she has to have her Rx filled through them. Patient voices understanding and plans to call us back to set that up.   Starlette Thurow, [place], CPhT  9:31 AM

## 2016-08-31 ENCOUNTER — Telehealth: Payer: Self-pay | Admitting: Pharmacist

## 2016-08-31 MED FILL — RASUVO 20 MG/0.4 ML AUTOINJ: 20 | 28 days supply | Qty: 2 | Fill #0

## 2016-08-31 NOTE — Telephone Encounter (Signed)
Received a call from patient who states the pharmacy was not able to fill her Rasuvo prescription because it was sent in for Otrexup, but Rasuvo was approved by her insurance.  I called the pharmacy and spoke to [place].  I had the prescription changed to Rasuvo 20 mg pens.  He confirms claim went through.  Pharmacy will have to order the medication and it will not come in until after patient comes in for her nurse visit tomorrow.  I informed her that we can still bring her in for nurse visit in the morning and provide her with Rasuvo sample pen.  She can pick up prescription after her visit. Patient voiced understanding and denies any questions.   Emma Warner, Pharm.D., BCPS, CPP Clinical Pharmacist Pager: [phone] Phone: [phone] 08/31/2016 1:49 PM

## 2016-09-01 ENCOUNTER — Ambulatory Visit (INDEPENDENT_AMBULATORY_CARE_PROVIDER_SITE_OTHER): Payer: Self-pay | Admitting: Pharmacist

## 2016-09-01 ENCOUNTER — Encounter (INDEPENDENT_AMBULATORY_CARE_PROVIDER_SITE_OTHER): Payer: Self-pay

## 2016-09-01 VITALS — BP 133/85 | HR 94

## 2016-09-01 DIAGNOSIS — Z79899 Other long term (current) drug therapy: Secondary | ICD-10-CM

## 2016-09-01 DIAGNOSIS — M0579 Rheumatoid arthritis with rheumatoid factor of multiple sites without organ or systems involvement: Secondary | ICD-10-CM

## 2016-09-01 NOTE — Progress Notes (Signed)
Pharmacy Note  Subjective:   Patient presents for education on Rasuvo injections.  Patient was previously counseled extensively on methotrexate on 07/16/16 and consented to initiation of methotrexate at that time.  She has been taking methotrexate 8 tablets by mouth weekly and folic acid 2 mg daily.  Patient presents to clinic today to receive the first dose of Rasuvo.  She confirms her last dose of methotrexate tablets was one week ago.  She is aware that Rasuvo injections will replace her methotrexate tablets.    Objective: CMP     Component Value Date/Time   NA 142 07/16/2016 0939   K 4.4 07/16/2016 0939   CL 103 07/16/2016 0939   CO2 22 07/16/2016 0939   GLUCOSE 107 (H) 07/16/2016 0939   BUN 15 07/16/2016 0939   CREATININE 0.81 07/16/2016 0939   CALCIUM 9.8 07/16/2016 0939   PROT 6.9 07/16/2016 0939   ALBUMIN 4.5 07/16/2016 0939   AST 16 07/16/2016 0939   ALT 22 07/16/2016 0939   ALKPHOS 68 07/16/2016 0939   BILITOT 0.3 07/16/2016 0939   GFRNONAA 79 07/16/2016 0939   GFRAA >89 07/16/2016 0939   CBC    Component Value Date/Time   WBC 9.2 07/16/2016 0939   RBC 4.39 07/16/2016 0939   HGB 13.2 07/16/2016 0939   HCT 39.3 07/16/2016 0939   PLT 377 07/16/2016 0939   MCV 89.5 07/16/2016 0939   MCH 30.1 07/16/2016 0939   MCHC 33.6 07/16/2016 0939   RDW 15.7 (H) 07/16/2016 0939   LYMPHSABS 1,380 07/16/2016 0939   MONOABS 460 07/16/2016 0939   EOSABS 92 07/16/2016 0939   BASOSABS 0 07/16/2016 0939   TB Gold: negative (07/22/16)  Assessment/Plan:  Patient was counseled on how to administer subcutaneous Rasuvo injection using a demonstration pen.  Patient administered the Rasuvo injection using a sample pen of Rasuvo 20 mg.  The medication was administered in the right side of her abdomen.  Lot: [id number] AA, Exp. 08/2017.  Patient tolerated injection well and confirms she feels comfortable injecting Rasuvo at home in future.  Patient will need standing lab orders in two weeks.   Provided patient with standing lab instructions and placed standing lab order.  Patient is scheduled for follow up visit on 09/17/16.    Lilla Shookachel Conroy Goracke, Pharm.D., BCPS Clinical Pharmacist Pager: [phone] Phone: [phone] 09/01/2016 9:13 AM

## 2016-09-01 NOTE — Patient Instructions (Signed)
Standing Labs We placed an order today for your standing lab work.    Please come back and get your standing labs in 2 weeks then every 3 months  We have open lab Monday through Friday from 8:30-11:30 AM and 1:30-4 PM at the office of Dr. Arbutus Ped, PA.   The office is located at [address], [place], [place] [address] No appointment is necessary.   Labs are drawn by [facility].  You may receive a bill from [facility] for your lab work. If you have any questions regarding directions or hours of operation,  please call [phone].     Methotrexate subcutaneous injection What is this medicine? METHOTREXATE (METH oh TREX ate) is a cytotoxic drug that also suppresses the immune system. It is used to treat psoriasis and rheumatoid arthritis. This medicine may be used for other purposes; ask your health care provider or pharmacist if you have questions. COMMON BRAND NAME(S): Otrexup, Rasuvo What should I tell my health care provider before I take this medicine? They need to know if you have any of these conditions: -fluid in the stomach area or lungs -if you often drink alcohol -infection or immune system problems -kidney disease -liver disease -low blood counts, like low white cell, platelet, or red cell counts -lung disease -radiation therapy -stomach ulcers -ulcerative colitis -an unusual or allergic reaction to methotrexate, other medicines, foods, dyes, or preservatives -pregnant or trying to get pregnant -breast-feeding How should I use this medicine? This medicine is for injection under the skin. You will be taught how to prepare and give this medicine. Refer to the Instructions for Use that come with your medication packaging. Use exactly as directed. Take your medicine at regular intervals. Do not take your medicine more often than directed. This medicine should be taken weekly, NOT daily. It is important that you put your used needles and  syringes in a special sharps container. Do not put them in a trash can. If you do not have a sharps container, call your pharmacist of healthcare provider to get one. Talk to your pediatrician regarding the use of this medicine in children. While this drug may be prescribed for children as young as 2 years for selected conditions, precautions do apply. Overdosage: If you think you have taken too much of this medicine contact a poison control center or emergency room at once. NOTE: This medicine is only for you. Do not share this medicine with others. What if I miss a dose? If you are not sure if this medicine was injected or if you have a hard time giving the injection, do not inject another dose. Talk with your doctor or health care professional. What may interact with this medicine? This medicine may interact with the following medications: -acitretin -aspirin or aspirin-like medicines including salicylates -azathioprine -certain antibiotics like chloramphenicol, penicillin, tetracycline -cyclosporine -gold -hydroxychloroquine -live virus vaccines -mercaptopurine -NSAIDs, medicines for pain and inflammation, like ibuprofen or naproxen -other cytotoxic agents -penicillamine -phenylbutazone -phenytoin -probenacid -retinoids such as isotretinoin and tretinoin -steroid medicines like prednisone or cortisone -sulfonamides like sulfasalazine and trimethoprim/sulfamethoxazole -theophylline This list may not describe all possible interactions. Give your health care provider a list of all the medicines, herbs, non-prescription drugs, or dietary supplements you use. Also tell them if you smoke, drink alcohol, or use illegal drugs. Some items may interact with your medicine. What should I watch for while using this medicine? Avoid alcoholic drinks. This medicine can make you more sensitive to the sun.  Keep out of the sun. If you cannot avoid being in the sun, wear protective clothing and use  sunscreen. Do not use sun lamps or tanning beds/booths. You may get drowsy or dizzy. Do not drive, use machinery, or do anything that needs mental alertness until you know how this medicine affects you. Do not stand or sit up quickly, especially if you are an older patient. This reduces the risk of dizzy or fainting spells. You may need blood work done while you are taking this medicine. Call your doctor or health care professional for advice if you get a fever, chills or sore throat, or other symptoms of a cold or flu. Do not treat yourself. This drug decreases your body's ability to fight infections. Try to avoid being around people who are sick. This medicine may increase your risk to bruise or bleed. Call your doctor or health care professional if you notice any unusual bleeding. Check with your doctor or health care professional if you get an attack of severe diarrhea, nausea and vomiting, or if you sweat a lot. The loss of too much body fluid can make it dangerous for you to take this medicine. Talk to your doctor about your risk of cancer. You may be more at risk for certain types of cancers if you take this medicine. Both men and women must use effective birth control with this medicine. Do not become pregnant while taking this medicine or until at least 1 normal menstrual cycle has occurred after stopping it. Women should inform their doctor if they wish to become pregnant or think they might be pregnant. Men should not father a child while taking this medicine and for 3 months after stopping it. There is a potential for serious side effects to an unborn child. Talk to your health care professional or pharmacist for more information. Do not breast-feed an infant while taking this medicine. What side effects may I notice from receiving this medicine? Side effects that you should report to your doctor or health care professional as soon as possible: -allergic reactions like skin rash, itching or  hives, swelling of the face, lips, or tongue -breathing problems or shortness of breath -diarrhea -dry, nonproductive cough -low blood counts - this medicine may decrease the number of white blood cells, red blood cells and platelets. You may be at increased risk of infections and bleeding -mouth sores -redness, blistering, peeling or loosening of the skin, including inside the mouth -signs of infection - fever or chills, cough, sore throat, pain or difficulty passing urine -signs and symptoms of bleeding such as bloody or black, tarry stools; red or dark-brown urine; spitting up blood or brown material that looks like coffee grounds; red spots on the skin; unusual bruising or bleeding from the eye, gums, or nose -signs and symptoms of kidney injury like trouble passing urine or change in the amount of urine -signs and symptoms of liver injury like dark yellow or brown urine; general ill feeling or flu-like symptoms; light-colored stools; loss of appetite; nausea; right upper belly pain; unusually weak or tired; yellowing of the eyes or skin Side effects that usually do not require medical attention (report to your doctor or health care professional if they continue or are bothersome): -dizziness -hair loss -headache -stomach pain -upset stomach -vomiting This list may not describe all possible side effects. Call your doctor for medical advice about side effects. You may report side effects to FDA at 1-800-FDA-1088. Where should I keep my medicine? Keep  out of the reach of children. You will be instructed on how to store this medicine. Throw away any unused medicine after the expiration date on the label. NOTE: This sheet is a summary. It may not cover all possible information. If you have questions about this medicine, talk to your doctor, pharmacist, or health care provider.  2018 Elsevier/Gold Standard (2015-04-03 11:50:46)

## 2016-09-10 ENCOUNTER — Ambulatory Visit: Payer: Self-pay | Admitting: Rheumatology

## 2016-09-13 NOTE — Progress Notes (Signed)
Office Visit Note  Patient: Emma Warner             Date of Birth: 05/10/1955           MRN: [id number]             PCP: Lizbeth Bark, NP Referring: Smothers, Cathleen Corti, NP Visit Date: 09/17/2016 Occupation: @GUAROCC @    Subjective:  Fatigue   History of Present Illness: GWENDELYN LANTING is a 61 y.o. female with history of rheumatoid arthritis and possible lupus overlap. She is on prednisone taper. She states that she has been decreased her prednisone down to 2.5 mg every other day she's been experiencing some pain and stiffness in her hands and feet. She has noticed some swelling in her feet. Her main concern is fatigue. She states she has fatigue on a regular basis.  Activities of Daily Living:  Patient reports morning stiffness for 10 minutes.   Patient Denies nocturnal pain.  Difficulty dressing/grooming: Denies Difficulty climbing stairs: Denies Difficulty getting out of chair: Denies Difficulty using hands for taps, buttons, cutlery, and/or writing: Denies   Review of Systems  Constitutional: Positive for fatigue. Negative for night sweats, weight gain, weight loss and weakness.  HENT: Negative for mouth sores, trouble swallowing, trouble swallowing, mouth dryness and nose dryness.   Eyes: Negative for pain, redness, visual disturbance and dryness.  Respiratory: Negative for cough, shortness of breath and difficulty breathing.   Cardiovascular: Negative for chest pain, palpitations, hypertension, irregular heartbeat and swelling in legs/feet.  Gastrointestinal: Negative for blood in stool, constipation and diarrhea.  Endocrine: Negative for increased urination.  Genitourinary: Negative for vaginal dryness.  Musculoskeletal: Positive for arthralgias, joint pain, joint swelling and morning stiffness. Negative for myalgias, muscle weakness, muscle tenderness and myalgias.  Skin: Negative for color change, rash, hair loss, skin tightness, ulcers and sensitivity to  sunlight.  Allergic/Immunologic: Negative for susceptible to infections.  Neurological: Negative for dizziness, memory loss and night sweats.  Hematological: Negative for swollen glands.  Psychiatric/Behavioral: Negative for depressed mood and sleep disturbance. The patient is not nervous/anxious.     PMFS History:  Patient Active Problem List   Diagnosis Date Noted  . Primary osteoarthritis of both hands 07/16/2016  . Rheumatoid arthritis involving multiple sites with positive rheumatoid factor (HCC) 07/15/2016  . ANA positive 07/15/2016  . History of gastroesophageal reflux (GERD) 07/15/2016  . Ds DNA antibody positive 07/15/2016  . Other fatigue 07/15/2016  . High risk medication use 07/15/2016  . Primary insomnia 07/15/2016  . Hypertension   . Hyperlipidemia   . GERD (gastroesophageal reflux disease)   . Prediabetes   . Vitamin D deficiency   . ALLERGIC RHINITIS 12/25/2007  . PLEURAL EFFUSION 12/25/2007    Past Medical History:  Diagnosis Date  . Arthritis   . GERD (gastroesophageal reflux disease)   . Hyperlipidemia   . Hypertension   . Prediabetes   . Vitamin D deficiency     Family History  Problem Relation Age of Onset  . Diabetes Mother   . Rheum arthritis Mother   . Cancer Father        colon  . Cirrhosis Brother        alcoholic  . Alcoholism Brother    Past Surgical History:  Procedure Laterality Date  . APPENDECTOMY     Social History   Social History Narrative  . No narrative on file     Objective: Vital Signs: BP 124/70 (BP Location: Left Arm,  Patient Position: Sitting, Cuff Size: Normal)   Pulse (!) 102   Resp 16   Ht 5\' 8"  (1.727 m)   Wt 183 lb (83 kg)   BMI 27.83 kg/m    Physical Exam  Constitutional: She is oriented to person, place, and time. She appears well-developed and well-nourished.  HENT:  Head: Normocephalic and atraumatic.  Eyes: Conjunctivae and EOM are normal.  Neck: Normal range of motion.  Cardiovascular: Normal  rate, regular rhythm, normal heart sounds and intact distal pulses.   Pulmonary/Chest: Effort normal and breath sounds normal.  Abdominal: Soft. Bowel sounds are normal.  Lymphadenopathy:    She has no cervical adenopathy.  Neurological: She is alert and oriented to person, place, and time.  Skin: Skin is warm and dry. Capillary refill takes less than 2 seconds.  Psychiatric: She has a normal mood and affect. Her behavior is normal.  Nursing note and vitals reviewed.    Musculoskeletal Exam: C-spine and thoracic lumbar spine good range of motion. Shoulder joints elbow joints wrist joints are good range of motion. No synovitis was noted over her MCP joints. She has some DIP PIP thickening in her hands consistent with osteoarthritis. Hip joints knee joints ankles MTPs PIPs with good range of motion with no synovitis.  CDAI Exam: CDAI Homunculus Exam:   Joint Counts:  CDAI Tender Joint count: 0 CDAI Swollen Joint count: 0  Global Assessments:  Patient Global Assessment: 3 Provider Global Assessment: 3  CDAI Calculated Score: 6    Investigation: No additional findings.  CBC Latest Ref Rng & Units 07/16/2016  WBC 3.8 - 10.8 K/uL 9.2  Hemoglobin 11.7 - 15.5 g/dL 69.6  Hematocrit 29.5 - 45.0 % 39.3  Platelets 140 - 400 K/uL 377    CMP Latest Ref Rng & Units 07/16/2016  Glucose 65 - 99 mg/dL 284(X)  BUN 7 - 25 mg/dL 15  Creatinine 3.24 - 4.01 mg/dL 0.27  Sodium 253 - 664 mmol/L 142  Potassium 3.5 - 5.3 mmol/L 4.4  Chloride 98 - 110 mmol/L 103  CO2 20 - 31 mmol/L 22  Calcium 8.6 - 10.4 mg/dL 9.8  Total Protein 6.1 - 8.1 g/dL 6.9  Total Bilirubin 0.2 - 1.2 mg/dL 0.3  Alkaline Phos 33 - 130 U/L 68  AST 10 - 35 U/L 16  ALT 6 - 29 U/L 22   07/16/2016 SPEP negative, immunoglobulins normal, hepatitis panel negative, HIV negative, TB gold negative, CK 106, RF 15, anti-CCP greater than 250, C3-C4 normal, dsDNA 78, anticardiolipin negative, beta 2 negative, lupus anticoagulant  negative Imaging: No results found.  Speciality Comments: No specialty comments available.    Procedures:  No procedures performed Allergies: Patient has no known allergies.   Assessment / Plan:     Visit Diagnoses: Rheumatoid arthritis involving multiple sites with positive rheumatoid factor (HCC)Patient is doing well on methotrexate. She started to swallow about 2 weeks ago. She is also on prednisone taper which she will completely come off. She has intermittent arthralgias and some joint swelling. We discussed adding Plaquenil to her methotrexate. She was in agreement. Indications side effects contraindications were discussed at length. Handout was given. She's been advised to get baseline eye examination. If call in prescription for Plaquenil 200 mg by mouth twice a day. Her labs are due next month.  ANA positive - DsDNA 78, C3-C4 normal,ACL-,b2-,LA-, consider [facility] lab in future. 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  High risk medication use - Rasuvo 20 mg subcutaneous every week, folic acid  2 mg by mouth daily, prednisone taper 2.5 mg every other day.  Primary osteoarthritis of both hands: She has some some stiffness due to osteoarthritis.  Primary insomnia: Good sleep hygiene discussed.  Other fatigue: Early morning exercise was discussed.  Vitamin D deficiency - Plan: VITAMIN D 25 Hydroxy (Vit-D Deficiency, Fractures)  History of hypertension: Blood pressure is better controlled  History of hyperlipidemia  History of prediabetes  History of gastroesophageal reflux (GERD)    Orders: Orders Placed This Encounter  Procedures  . VITAMIN D 25 Hydroxy (Vit-D Deficiency, Fractures)   Meds ordered this encounter  Medications  . hydroxychloroquine (PLAQUENIL) 200 MG tablet    Sig: Take 1 tablet (200 mg total) by mouth 2 (two) times daily.    Dispense:  60 tablet    Refill:  2    Face-to-face time spent with patient was 30 minutes. 50% of time was spent in counseling and  coordination of care.  Follow-Up Instructions: Return in about 5 months (around 02/17/2017) for Rheumatoid arthritis,+ds,+ANA.   Pollyann SavoyShaili Jiyah Torpey, MD  Note - This record has been created using [employer] software.  Chart creation errors have been sought, but may not always  have been located. Such creation errors do not reflect on  the standard of medical care.

## 2016-09-17 ENCOUNTER — Ambulatory Visit (INDEPENDENT_AMBULATORY_CARE_PROVIDER_SITE_OTHER): Payer: 59 | Admitting: Rheumatology

## 2016-09-17 ENCOUNTER — Encounter: Payer: Self-pay | Admitting: Rheumatology

## 2016-09-17 VITALS — BP 124/70 | HR 102 | Resp 16 | Ht 68.0 in | Wt 183.0 lb

## 2016-09-17 DIAGNOSIS — M0579 Rheumatoid arthritis with rheumatoid factor of multiple sites without organ or systems involvement: Secondary | ICD-10-CM

## 2016-09-17 DIAGNOSIS — Z8639 Personal history of other endocrine, nutritional and metabolic disease: Secondary | ICD-10-CM

## 2016-09-17 DIAGNOSIS — E559 Vitamin D deficiency, unspecified: Secondary | ICD-10-CM | POA: Diagnosis not present

## 2016-09-17 DIAGNOSIS — Z8719 Personal history of other diseases of the digestive system: Secondary | ICD-10-CM

## 2016-09-17 DIAGNOSIS — F5101 Primary insomnia: Secondary | ICD-10-CM

## 2016-09-17 DIAGNOSIS — Z79899 Other long term (current) drug therapy: Secondary | ICD-10-CM | POA: Diagnosis not present

## 2016-09-17 DIAGNOSIS — Z87898 Personal history of other specified conditions: Secondary | ICD-10-CM | POA: Diagnosis not present

## 2016-09-17 DIAGNOSIS — M19041 Primary osteoarthritis, right hand: Secondary | ICD-10-CM | POA: Diagnosis not present

## 2016-09-17 DIAGNOSIS — Z8679 Personal history of other diseases of the circulatory system: Secondary | ICD-10-CM | POA: Diagnosis not present

## 2016-09-17 DIAGNOSIS — R768 Other specified abnormal immunological findings in serum: Secondary | ICD-10-CM

## 2016-09-17 DIAGNOSIS — M19042 Primary osteoarthritis, left hand: Secondary | ICD-10-CM

## 2016-09-17 DIAGNOSIS — R5383 Other fatigue: Secondary | ICD-10-CM

## 2016-09-17 MED ORDER — HYDROXYCHLOROQUINE SULFATE 200 MG PO TABS: 200.0000 mg | ORAL_TABLET | Freq: Two times a day (BID) | ORAL | 2 refills | Status: DC

## 2016-09-17 NOTE — Patient Instructions (Signed)
Please discuss DEXA scan with your PCP if due   Hydroxychloroquine tablets What is this medicine? HYDROXYCHLOROQUINE (hye drox ee KLOR oh kwin) is used to treat rheumatoid arthritis and systemic lupus erythematosus. It is also used to treat malaria. This medicine may be used for other purposes; ask your health care provider or pharmacist if you have questions. COMMON BRAND NAME(S): Plaquenil, Quineprox What should I tell my health care provider before I take this medicine? They need to know if you have any of these conditions: -diabetes -eye disease, vision problems -G6PD deficiency -history of blood diseases -history of irregular heartbeat -if you often drink alcohol -kidney disease -liver disease -porphyria -psoriasis -seizures -an unusual or allergic reaction to chloroquine, hydroxychloroquine, other medicines, foods, dyes, or preservatives -pregnant or trying to get pregnant -breast-feeding How should I use this medicine? Take this medicine by mouth with a glass of water. Follow the directions on the prescription label. Avoid taking antacids within 4 hours of taking this medicine. It is best to separate these medicines by at least 4 hours. Do not cut, crush or chew this medicine. You can take it with or without food. If it upsets your stomach, take it with food. Take your medicine at regular intervals. Do not take your medicine more often than directed. Take all of your medicine as directed even if you think you are better. Do not skip doses or stop your medicine early. Talk to your pediatrician regarding the use of this medicine in children. While this drug may be prescribed for selected conditions, precautions do apply. Overdosage: If you think you have taken too much of this medicine contact a poison control center or emergency room at once. NOTE: This medicine is only for you. Do not share this medicine with others. What if I miss a dose? If you miss a dose, take it as soon as  you can. If it is almost time for your next dose, take only that dose. Do not take double or extra doses. What may interact with this medicine? Do not take this medicine with any of the following medications: -cisapride -dofetilide -dronedarone -live virus vaccines -penicillamine -pimozide -thioridazine -ziprasidone This medicine may also interact with the following medications: -ampicillin -antacids -cimetidine -cyclosporine -digoxin -medicines for diabetes, like insulin, glipizide, glyburide -medicines for seizures like carbamazepine, phenobarbital, phenytoin -mefloquine -methotrexate -other medicines that prolong the QT interval (cause an abnormal heart rhythm) -praziquantel This list may not describe all possible interactions. Give your health care provider a list of all the medicines, herbs, non-prescription drugs, or dietary supplements you use. Also tell them if you smoke, drink alcohol, or use illegal drugs. Some items may interact with your medicine. What should I watch for while using this medicine? Tell your doctor or healthcare professional if your symptoms do not start to get better or if they get worse. Avoid taking antacids within 4 hours of taking this medicine. It is best to separate these medicines by at least 4 hours. Tell your doctor or health care professional right away if you have any change in your eyesight. Your vision and blood may be tested before and during use of this medicine. This medicine can make you more sensitive to the sun. Keep out of the sun. If you cannot avoid being in the sun, wear protective clothing and use sunscreen. Do not use sun lamps or tanning beds/booths. What side effects may I notice from receiving this medicine? Side effects that you should report to your doctor or health  care professional as soon as possible: -allergic reactions like skin rash, itching or hives, swelling of the face, lips, or tongue -changes in vision -decreased  hearing or ringing of the ears -redness, blistering, peeling or loosening of the skin, including inside the mouth -seizures -sensitivity to light -signs and symptoms of a dangerous change in heartbeat or heart rhythm like chest pain; dizziness; fast or irregular heartbeat; palpitations; feeling faint or lightheaded, falls; breathing problems -signs and symptoms of liver injury like dark yellow or brown urine; general ill feeling or flu-like symptoms; light-colored stools; loss of appetite; nausea; right upper belly pain; unusually weak or tired; yellowing of the eyes or skin -signs and symptoms of low blood sugar such as feeling anxious; confusion; dizziness; increased hunger; unusually weak or tired; sweating; shakiness; cold; irritable; headache; blurred vision; fast heartbeat; loss of consciousness -uncontrollable head, mouth, neck, arm, or leg movements Side effects that usually do not require medical attention (report to your doctor or health care professional if they continue or are bothersome): -anxious -diarrhea -dizziness -hair loss -headache -irritable -loss of appetite -nausea, vomiting -stomach pain This list may not describe all possible side effects. Call your doctor for medical advice about side effects. You may report side effects to FDA at 1-800-FDA-1088. Where should I keep my medicine? Keep out of the reach of children. In children, this medicine can cause overdose with small doses. Store at room temperature between 15 and 30 degrees C (59 and 86 degrees F). Protect from moisture and light. Throw away any unused medicine after the expiration date. NOTE: This sheet is a summary. It may not cover all possible information. If you have questions about this medicine, talk to your doctor, pharmacist, or health care provider.  2018 Elsevier/Gold Standard (2015-10-15 14:16:15) Standing Labs We placed an order today for your standing lab work.    Please come back and get your  standing labs in August and every 3 months  We have open lab Monday through Friday from 8:30-11:30 AM and 1:30-4 PM at the office of Dr. Pollyann SavoyShaili Yahya Boldman.   The office is located at [address], [address], [place], [place] [address] No appointment is necessary.   Labs are drawn by [facility].  You may receive a bill from [facility] for your lab work. If you have any questions regarding directions or hours of operation,  please call [phone].

## 2016-10-01 ENCOUNTER — Telehealth: Payer: Self-pay

## 2016-10-01 NOTE — Telephone Encounter (Signed)
Emma [facility] with [facility] called concerning a Rx for Rasuvo for patient to be faxed to [phone].  Cb# is [phone].  Please advise. Thank You.

## 2016-10-01 NOTE — Telephone Encounter (Signed)
Patient is having prescription [facility]. Patient has not contacted the office requesting the [facility] to be [facility] to [facility] . Advised them would not authorize the switch unless the patient contacted the office requesting it.

## 2016-10-05 MED FILL — RASUVO 20 MG/0.4 ML AUTOINJ: 20 | 28 days supply | Qty: 2 | Fill #1

## 2016-10-12 ENCOUNTER — Telehealth: Payer: Self-pay | Admitting: Rheumatology

## 2016-10-12 NOTE — Telephone Encounter (Signed)
Patient has been scheduled for 10/14/16 at 10:15 am.

## 2016-10-12 NOTE — Telephone Encounter (Signed)
Patient calling due to Plaquenil. Patient feels like she is feeling worse since starting it. She is experiencing persistent nausea, headaches, diarrhea. Please call patient to advise.

## 2016-10-12 NOTE — Telephone Encounter (Signed)
Please try to schedule an earlier appointment for this patient she will come off Plaquenil. We may consider adding Orencia at follow-up visit as she has RA and lupus overlap.

## 2016-10-12 NOTE — Progress Notes (Signed)
Office Visit Note  Patient: Emma OremCynthia C Wendt             Date of Birth: May 28, 1955           MRN: [id number]             PCP: Lizbeth BarkSmothers, Deborah N, NP Referring: Glenetta BorgSmothers, Cathleen Cortieborah N, NP Visit Date: 10/14/2016 Occupation: @GUAROCC @    Subjective:  Fatigue.   History of Present Illness: Emma Warner is a 61 y.o. female with history of rheumatoid arthritis and possible lupus overlap. She continues to have increased fatigue. She tried Plaquenil for about 3 weeks she states first week she couldn't tolerate it fine with the second week she started experiencing nausea and diarrhea, headaches. She discontinued the medication. He is she does not have much joint discomfort. She does not have any joint swelling. She's been off prednisone since July.  Activities of Daily Living:  Patient reports morning stiffness for 0 minute.   Patient Denies nocturnal pain.  Difficulty dressing/grooming: Denies Difficulty climbing stairs: Denies Difficulty getting out of chair: Denies Difficulty using hands for taps, buttons, cutlery, and/or writing: Denies   Review of Systems  Constitutional: Positive for fatigue. Negative for night sweats, weight gain, weight loss and weakness.  HENT: Negative for mouth sores, trouble swallowing, trouble swallowing, mouth dryness and nose dryness.   Eyes: Negative for pain, redness, visual disturbance and dryness.  Respiratory: Negative for cough, shortness of breath and difficulty breathing.   Cardiovascular: Negative for chest pain, palpitations, hypertension, irregular heartbeat and swelling in legs/feet.  Gastrointestinal: Negative for blood in stool, constipation and diarrhea.  Endocrine: Negative for increased urination.  Genitourinary: Negative for vaginal dryness.  Musculoskeletal: Positive for arthralgias and joint pain. Negative for joint swelling, myalgias, muscle weakness, morning stiffness, muscle tenderness and myalgias.  Skin: Negative for color change,  rash, hair loss, skin tightness, ulcers and sensitivity to sunlight.  Allergic/Immunologic: Negative for susceptible to infections.  Neurological: Negative for dizziness, memory loss and night sweats.  Hematological: Negative for swollen glands.  Psychiatric/Behavioral: Negative for depressed mood and sleep disturbance. The patient is not nervous/anxious.     PMFS History:  Patient Active Problem List   Diagnosis Date Noted  . Primary osteoarthritis of both hands 07/16/2016  . Rheumatoid arthritis involving multiple sites with positive rheumatoid factor (HCC) 07/15/2016  . ANA positive 07/15/2016  . History of gastroesophageal reflux (GERD) 07/15/2016  . Ds DNA antibody positive 07/15/2016  . Other fatigue 07/15/2016  . High risk medication use 07/15/2016  . Primary insomnia 07/15/2016  . Hypertension   . Hyperlipidemia   . GERD (gastroesophageal reflux disease)   . Prediabetes   . Vitamin D deficiency   . ALLERGIC RHINITIS 12/25/2007  . PLEURAL EFFUSION 12/25/2007    Past Medical History:  Diagnosis Date  . Arthritis   . GERD (gastroesophageal reflux disease)   . Hyperlipidemia   . Hypertension   . Prediabetes   . Vitamin D deficiency     Family History  Problem Relation Age of Onset  . Diabetes Mother   . Rheum arthritis Mother   . Cancer Father        colon  . Cirrhosis Brother        alcoholic  . Alcoholism Brother    Past Surgical History:  Procedure Laterality Date  . APPENDECTOMY     Social History   Social History Narrative  . No narrative on file     Objective: Vital Signs: BP 126/71 (  BP Location: Left Arm, Patient Position: Sitting, Cuff Size: Normal)   Pulse (!) 102   Ht 5\' 8"  (1.727 m)   Wt 181 lb (82.1 kg)   BMI 27.52 kg/m    Physical Exam  Constitutional: She is oriented to person, place, and time. She appears well-developed and well-nourished.  HENT:  Head: Normocephalic and atraumatic.  Eyes: Conjunctivae and EOM are normal.  Neck:  Normal range of motion.  Cardiovascular: Normal rate, regular rhythm, normal heart sounds and intact distal pulses.   Pulmonary/Chest: Effort normal and breath sounds normal.  Abdominal: Soft. Bowel sounds are normal.  Lymphadenopathy:    She has no cervical adenopathy.  Neurological: She is alert and oriented to person, place, and time.  Skin: Skin is warm and dry. Capillary refill takes less than 2 seconds.  Psychiatric: She has a normal mood and affect. Her behavior is normal.  Nursing note and vitals reviewed.    Musculoskeletal Exam: C-spine and thoracic lumbar spine good range of motion. Shoulder joints although joints wrist joint MCPs PIPs DIPs with good range of motion with no synovitis. Hip joints knee joints ankles MTPs PIPs DIPs are good range of motion with no synovitis.  CDAI Exam: CDAI Homunculus Exam:   Joint Counts:  CDAI Tender Joint count: 0 CDAI Swollen Joint count: 0  Global Assessments:  Patient Global Assessment: 2 Provider Global Assessment: 2  CDAI Calculated Score: 4    Investigation: No additional findings. CBC Latest Ref Rng & Units 07/16/2016  WBC 3.8 - 10.8 K/uL 9.2  Hemoglobin 11.7 - 15.5 g/dL 16.1  Hematocrit 09.6 - 45.0 % 39.3  Platelets 140 - 400 K/uL 377   CMP     Component Value Date/Time   NA 142 07/16/2016 0939   K 4.4 07/16/2016 0939   CL 103 07/16/2016 0939   CO2 22 07/16/2016 0939   GLUCOSE 107 (H) 07/16/2016 0939   BUN 15 07/16/2016 0939   CREATININE 0.81 07/16/2016 0939   CALCIUM 9.8 07/16/2016 0939   PROT 6.9 07/16/2016 0939   ALBUMIN 4.5 07/16/2016 0939   AST 16 07/16/2016 0939   ALT 22 07/16/2016 0939   ALKPHOS 68 07/16/2016 0939   BILITOT 0.3 07/16/2016 0939   GFRNONAA 79 07/16/2016 0939   GFRAA >89 07/16/2016 0939   Imaging: No results found.  Speciality Comments: No specialty comments available.    Procedures:  No procedures performed Allergies: Patient has no known allergies.   Assessment / Plan:       Visit Diagnoses: Rheumatoid arthritis involving multiple sites with positive rheumatoid factor (HCC): She continues to have some intermittent joint puffiness I do not see any synovitis on examination today. She denies any morning stiffness. She tried Plaquenil but discontinued due to side effects of the medication. I discussed that in future we can try a lower dose maybe 1 tablet every other day and see how she tolerates that or the other option could be to add Orencia in future.  ANA positive - dsDNA 78, C3 and C4, aCL,b2, LA all negative.  High risk medication use - Methotrexate 20 mg subcutaneous every week, folic acid 2 mg by mouth daily - Plan: CBC with Differential/Platelet, COMPLETE METABOLIC PANEL WITH GFR  Primary osteoarthritis of both hands: Joint protection and muscle strengthening discussed.  Vitamin D deficiency - Plan: VITAMIN D 25 Hydroxy (Vit-D Deficiency, Fractures)  Other fatigue. Fatigue is a major concern today. I will check her vitamin D and TSH today. - Plan: TSH  Primary insomnia  Gastroesophageal reflux disease without esophagitis  Essential hypertension  Mixed hyperlipidemia  Prediabetes   Orders: Orders Placed This Encounter  Procedures  . TSH   No orders of the defined types were placed in this encounter.   Follow-Up Instructions: Return for Rheumatoid arthritis.   Pollyann SavoyShaili Vannary Greening, MD  Note - This record has been created using Animal nutritionistDragon software.  Chart creation errors have been sought, but may not always  have been located. Such creation errors do not reflect on  the standard of medical care.

## 2016-10-12 NOTE — Telephone Encounter (Signed)
I called patient to discuss Plaquenil.  She reports she has been on the medication for three weeks.  About a week after starting the medication she reports increased fatigue, occasional headache, persistent nausea, diarrhea, and aching in her bones.  She confirms she finished prednisone taper last week.  She wants to know if she should continue Plaquenil.  I advised her can do a trial off Plaquenil to see if symptoms improve.

## 2016-10-14 ENCOUNTER — Encounter: Payer: Self-pay | Admitting: Rheumatology

## 2016-10-14 ENCOUNTER — Ambulatory Visit (INDEPENDENT_AMBULATORY_CARE_PROVIDER_SITE_OTHER): Payer: 59 | Admitting: Rheumatology

## 2016-10-14 VITALS — BP 126/71 | HR 102 | Ht 68.0 in | Wt 181.0 lb

## 2016-10-14 DIAGNOSIS — M0579 Rheumatoid arthritis with rheumatoid factor of multiple sites without organ or systems involvement: Secondary | ICD-10-CM

## 2016-10-14 DIAGNOSIS — R5383 Other fatigue: Secondary | ICD-10-CM | POA: Diagnosis not present

## 2016-10-14 DIAGNOSIS — Z79899 Other long term (current) drug therapy: Secondary | ICD-10-CM | POA: Diagnosis not present

## 2016-10-14 DIAGNOSIS — R7303 Prediabetes: Secondary | ICD-10-CM | POA: Diagnosis not present

## 2016-10-14 DIAGNOSIS — M19041 Primary osteoarthritis, right hand: Secondary | ICD-10-CM | POA: Diagnosis not present

## 2016-10-14 DIAGNOSIS — K219 Gastro-esophageal reflux disease without esophagitis: Secondary | ICD-10-CM | POA: Diagnosis not present

## 2016-10-14 DIAGNOSIS — E559 Vitamin D deficiency, unspecified: Secondary | ICD-10-CM

## 2016-10-14 DIAGNOSIS — F5101 Primary insomnia: Secondary | ICD-10-CM | POA: Diagnosis not present

## 2016-10-14 DIAGNOSIS — R768 Other specified abnormal immunological findings in serum: Secondary | ICD-10-CM | POA: Diagnosis not present

## 2016-10-14 DIAGNOSIS — E782 Mixed hyperlipidemia: Secondary | ICD-10-CM

## 2016-10-14 DIAGNOSIS — M19042 Primary osteoarthritis, left hand: Secondary | ICD-10-CM

## 2016-10-14 DIAGNOSIS — I10 Essential (primary) hypertension: Secondary | ICD-10-CM | POA: Diagnosis not present

## 2016-10-14 LAB — COMPLETE METABOLIC PANEL WITH GFR
ALT: 19 U/L (ref 6–29)
AST: 15 U/L (ref 10–35)
Albumin: 4.4 g/dL (ref 3.6–5.1)
Alkaline Phosphatase: 74 U/L (ref 33–130)
BILIRUBIN TOTAL: 0.3 mg/dL (ref 0.2–1.2)
BUN: 12 mg/dL (ref 7–25)
CHLORIDE: 104 mmol/L (ref 98–110)
CO2: 22 mmol/L (ref 20–31)
Calcium: 9.8 mg/dL (ref 8.6–10.4)
Creat: 0.73 mg/dL (ref 0.50–0.99)
GFR, Est African American: >89 mL/min (ref >=60)
GLUCOSE: 95 mg/dL (ref 65–99)
Potassium: 4.4 mmol/L (ref 3.5–5.3)
SODIUM: 140 mmol/L (ref 135–146)
TOTAL PROTEIN: 6.9 g/dL (ref 6.1–8.1)

## 2016-10-14 LAB — CBC WITH DIFFERENTIAL/PLATELET
BASOS PCT: 0 %
Basophils Absolute: 0 cells/uL (ref 0–200)
EOS PCT: 1 %
Eosinophils Absolute: 72 cells/uL (ref 15–500)
HCT: 39.3 % (ref 35.0–45.0)
Hemoglobin: 12.9 g/dL (ref 11.7–15.5)
LYMPHS PCT: 26 %
Lymphs Abs: 1872 cells/uL (ref 850–3900)
MCH: 29.7 pg (ref 27.0–33.0)
MCHC: 32.8 g/dL (ref 32.0–36.0)
MCV: 90.6 fL (ref 80.0–100.0)
MONOS PCT: 9 %
MPV: 10.3 fL (ref 7.5–12.5)
Monocytes Absolute: 648 cells/uL (ref 200–950)
NEUTROS PCT: 64 %
Neutro Abs: 4608 cells/uL (ref 1500–7800)
PLATELETS: 354 10*3/uL (ref 140–400)
RBC: 4.34 MIL/uL (ref 3.80–5.10)
RDW: 14.2 % (ref 11.0–15.0)
WBC: 7.2 10*3/uL (ref 3.8–10.8)

## 2016-10-15 LAB — TSH: TSH: 0.72 mIU/L

## 2016-10-15 LAB — VITAMIN D 25 HYDROXY (VIT D DEFICIENCY, FRACTURES): VIT D 25 HYDROXY: 50 ng/mL (ref 30–100)

## 2016-10-15 NOTE — Progress Notes (Signed)
Within normal limits

## 2016-10-27 ENCOUNTER — Telehealth: Payer: Self-pay | Admitting: Radiology

## 2016-10-27 NOTE — Telephone Encounter (Signed)
[facility] labs show patient does not have lupus, sent to be scanned

## 2016-10-28 NOTE — Telephone Encounter (Signed)
I have called patient to advise.  

## 2016-11-02 MED FILL — RASUVO 20 MG/0.4 ML AUTOINJ: 20 | 28 days supply | Qty: 2 | Fill #2

## 2016-11-30 ENCOUNTER — Other Ambulatory Visit: Payer: Self-pay | Admitting: Rheumatology

## 2016-11-30 MED ORDER — METHOTREXATE (PF) 20 MG/0.4ML ~~LOC~~ SOAJ: 20.0000 mg | SUBCUTANEOUS | 2 refills | Status: DC

## 2016-11-30 MED FILL — RASUVO 20 MG/0.4 ML AUTOINJ: 20 | 28 days supply | Qty: 2 | Fill #0

## 2016-11-30 NOTE — Telephone Encounter (Signed)
Patient called requesting refill of Rasuvo to be sent to [facility] outpatient pharmacy.

## 2016-11-30 NOTE — Telephone Encounter (Signed)
Last Visit: 10/14/16 Next Visit: 03/17/17 Labs: 8/2/18WNL  Okay to refill per Dr. Corliss Skains

## 2016-12-24 ENCOUNTER — Ambulatory Visit: Payer: 59 | Admitting: Rheumatology

## 2016-12-28 ENCOUNTER — Telehealth: Payer: Self-pay

## 2016-12-28 NOTE — Telephone Encounter (Signed)
Patient called stating that she has new insurance and we will need to call her insurance to prove that patient has done step therapy and is now taking Rasuvo injections.  Her insurance is [employer], phone number is [phone].  CB# is [phone].  Please advise.  Thank you.

## 2016-12-30 ENCOUNTER — Telehealth: Payer: Self-pay

## 2016-12-30 NOTE — Telephone Encounter (Signed)
Submitted a prior authorization for Rasuvo with patients new insurance plan via cover my meds. Will update once we receive a response.   [employer] BIN: [id number] PCN: none ID: [id number] Group: [id number]  Abran DukeHopkins, Ermel Verne, CPhT 11:26 AM

## 2016-12-31 MED FILL — RASUVO 20 MG/0.4 ML AUTOINJ: 20 | 28 days supply | Qty: 2 | Fill #1

## 2016-12-31 NOTE — Telephone Encounter (Signed)
[facility] for Rasuvo 20mg /0.984ml from 12/30/2016 to 12/30/2017.   Reference number:[employer] Value-Value Plus [id number] Phone number:[phone]  Will send document to scan center.  Called patient to update. Patient will call pharmacy to refill her Rx. Patient voices understanding and denies any questions at this time.   Seneca Hoback, [place], CPhT 9:09 AM

## 2017-02-01 MED FILL — RASUVO 20 MG/0.4 ML AUTOINJ: 20 | 28 days supply | Qty: 2 | Fill #2

## 2017-02-24 ENCOUNTER — Telehealth: Payer: Self-pay

## 2017-02-24 NOTE — Telephone Encounter (Signed)
FMLA paperwork not in our office.

## 2017-02-24 NOTE — Telephone Encounter (Signed)
Patient brought FMLA papers by before Dr. Corliss Skainseveshwar went on vacation in November.  Could you check with Dr. Corliss Skainseveshwar to see if she has patient paperwork. CB# [phone].  Please advise.

## 2017-03-01 ENCOUNTER — Telehealth: Payer: Self-pay

## 2017-03-01 ENCOUNTER — Other Ambulatory Visit: Payer: Self-pay | Admitting: Rheumatology

## 2017-03-01 NOTE — Telephone Encounter (Signed)
Patient would like a call back at [phone].  Please advise.  Thank you.

## 2017-03-02 ENCOUNTER — Other Ambulatory Visit: Payer: Self-pay

## 2017-03-02 DIAGNOSIS — Z79899 Other long term (current) drug therapy: Secondary | ICD-10-CM | POA: Diagnosis not present

## 2017-03-02 LAB — COMPLETE METABOLIC PANEL WITH GFR
AG RATIO: 1.8 (calc) (ref 1.0–2.5)
ALKALINE PHOSPHATASE (APISO): 91 U/L (ref 33–130)
ALT: 20 U/L (ref 6–29)
AST: 16 U/L (ref 10–35)
Albumin: 4.3 g/dL (ref 3.6–5.1)
BUN: 13 mg/dL (ref 7–25)
CO2: 27 mmol/L (ref 20–32)
CREATININE: 0.75 mg/dL (ref 0.50–0.99)
Calcium: 9.4 mg/dL (ref 8.6–10.4)
Chloride: 105 mmol/L (ref 98–110)
GFR, Est African American: 100 mL/min/{1.73_m2} (ref >=60)
GFR, Est Non African American: 86 mL/min/{1.73_m2} (ref >=60)
GLOBULIN: 2.4 g/dL (ref 1.9–3.7)
Glucose, Bld: 111 mg/dL — ABNORMAL HIGH (ref 65–99)
POTASSIUM: 3.9 mmol/L (ref 3.5–5.3)
SODIUM: 142 mmol/L (ref 135–146)
Total Bilirubin: 0.3 mg/dL (ref 0.2–1.2)
Total Protein: 6.7 g/dL (ref 6.1–8.1)

## 2017-03-02 LAB — CBC WITH DIFFERENTIAL/PLATELET
BASOS PCT: 0.5 %
Basophils Absolute: 42 cells/uL (ref 0–200)
Eosinophils Absolute: 83 cells/uL (ref 15–500)
Eosinophils Relative: 1 %
HCT: 35.8 % (ref 35.0–45.0)
HEMOGLOBIN: 12.1 g/dL (ref 11.7–15.5)
Lymphs Abs: 1776 cells/uL (ref 850–3900)
MCH: 29.4 pg (ref 27.0–33.0)
MCHC: 33.8 g/dL (ref 32.0–36.0)
MCV: 86.9 fL (ref 80.0–100.0)
MONOS PCT: 7.2 %
MPV: 10.7 fL (ref 7.5–12.5)
NEUTROS ABS: 5802 {cells}/uL (ref 1500–7800)
Neutrophils Relative %: 69.9 %
PLATELETS: 299 10*3/uL (ref 140–400)
RBC: 4.12 10*6/uL (ref 3.80–5.10)
RDW: 13.4 % (ref 11.0–15.0)
TOTAL LYMPHOCYTE: 21.4 %
WBC mixed population: 598 cells/uL (ref 200–950)
WBC: 8.3 10*3/uL (ref 3.8–10.8)

## 2017-03-02 MED FILL — RASUVO 20 MG/0.4 ML AUTOINJ: 20 | 28 days supply | Qty: 2 | Fill #0

## 2017-03-02 NOTE — Telephone Encounter (Signed)
Paperwork sent to [employer]

## 2017-03-02 NOTE — Telephone Encounter (Signed)
I called patient. She states that due to the change in her insurance she has to find a new PCP. I agree to fill her FMLA until she finds a new PCP. She is requesting 2 days off a month. Okay to proceed with FMLA. She states that she has to take approximately 5 hours a week off for flares.

## 2017-03-02 NOTE — Telephone Encounter (Signed)
Patient came by the office and discussed having her FMLA paperwork filled out. Patient was advised that we do not have a copy of her FMLA paperwork here in our office. Patient dropped a new copy of the FMLA paperwork off. Patient is a previous Dr. Kellie Simmeringruslow patient and he was filling out her FMLA. Patient would like to know if we can take over her FMLA paperwork. Please advise.

## 2017-03-02 NOTE — Telephone Encounter (Signed)
Attempted to contact the patient and left message for patient to call the office.  

## 2017-03-02 NOTE — Telephone Encounter (Signed)
Last Visit: 10/14/16 Next Visit: 03/17/17 Labs: 10/14/16 WNL  Left message to advise patient she is due for labs.  Okay to refill 30 day supply per Dr. Corliss Skainseveshwar

## 2017-03-03 NOTE — Progress Notes (Signed)
Within normal limits

## 2017-03-03 NOTE — Progress Notes (Signed)
Office Visit Note  Patient: Emma Warner             Date of Birth: March 01, 1956           MRN: [id number]             PCP: Lizbeth Bark, NP Referring: Smothers, Cathleen Corti, NP Visit Date: 03/17/2017 Occupation: @GUAROCC @    Subjective:  Pain in both feet and left wrist    History of Present Illness: Emma Warner is a 61 y.o. female with a history of seropositive rheumatoid arthritis and osteoarthritis.  Patient states she continues to have significant fatigue, although it has improved over the past few months.  She states she continues to have constant pain in her bilateral feet.  She also notices swelling on the top of her feet.  She has pain in hands.  Her left wrist causes significant pain and swells.  Her wrist pain occasionally keeps her up at night.  She has significant stiffness in the morning as well.  She would like to discuss other medications.  She would like to discuss starting Enbrel.    Activities of Daily Living:  Patient reports morning stiffness for 1.5 hour.   Patient Reports nocturnal pain.  Difficulty dressing/grooming: Denies Difficulty climbing stairs: Denies Difficulty getting out of chair: Denies Difficulty using hands for taps, buttons, cutlery, and/or writing: Denies   Review of Systems  Constitutional: Negative for fatigue and weakness.  HENT: Positive for mouth sores. Negative for mouth dryness and nose dryness.   Eyes: Negative for redness and dryness.  Respiratory: Negative for cough, hemoptysis, shortness of breath and difficulty breathing.   Cardiovascular: Negative for chest pain, palpitations, hypertension, irregular heartbeat and swelling in legs/feet.  Gastrointestinal: Negative for blood in stool, constipation and diarrhea.  Endocrine: Negative for increased urination.  Genitourinary: Negative for painful urination.  Musculoskeletal: Positive for arthralgias, joint pain, joint swelling and morning stiffness. Negative for myalgias,  muscle weakness, muscle tenderness and myalgias.  Skin: Negative for color change, pallor, rash, hair loss, nodules/bumps, redness, skin tightness, ulcers and sensitivity to sunlight.  Neurological: Negative for dizziness, numbness and headaches.  Hematological: Negative for swollen glands.  Psychiatric/Behavioral: Negative for depressed mood and sleep disturbance. The patient is not nervous/anxious.     PMFS History:  Patient Active Problem List   Diagnosis Date Noted  . Primary osteoarthritis of both hands 07/16/2016  . Rheumatoid arthritis involving multiple sites with positive rheumatoid factor (HCC) 07/15/2016  . ANA positive 07/15/2016  . History of gastroesophageal reflux (GERD) 07/15/2016  . Ds DNA antibody positive/ [facility] labs show patient does not have lupus 10/2016 07/15/2016  . Other fatigue 07/15/2016  . High risk medication use 07/15/2016  . Primary insomnia 07/15/2016  . Hypertension   . Hyperlipidemia   . GERD (gastroesophageal reflux disease)   . Prediabetes   . Vitamin D deficiency   . ALLERGIC RHINITIS 12/25/2007  . PLEURAL EFFUSION 12/25/2007    Past Medical History:  Diagnosis Date  . Arthritis   . GERD (gastroesophageal reflux disease)   . Hyperlipidemia   . Hypertension   . Prediabetes   . Vitamin D deficiency     Family History  Problem Relation Age of Onset  . Diabetes Mother   . Rheum arthritis Mother   . Cancer Father        colon  . Cirrhosis Brother        alcoholic  . Alcoholism Brother    Past Surgical  History:  Procedure Laterality Date  . APPENDECTOMY     Social History   Social History Narrative  . Not on file     Objective: Vital Signs: BP 130/82 (BP Location: Left Arm, Patient Position: Sitting, Cuff Size: Normal)   Pulse 71   Resp 15   Ht 5\' 8"  (1.727 m)   Wt 181 lb (82.1 kg)   BMI 27.52 kg/m    Physical Exam  Constitutional: She is oriented to person, place, and time. She appears well-developed and well-nourished.    HENT:  Head: Normocephalic and atraumatic.  Eyes: Conjunctivae and EOM are normal.  Neck: Normal range of motion.  Cardiovascular: Normal rate, regular rhythm, normal heart sounds and intact distal pulses.  Pulmonary/Chest: Effort normal and breath sounds normal.  Abdominal: Soft. Bowel sounds are normal.  Lymphadenopathy:    She has no cervical adenopathy.  Neurological: She is alert and oriented to person, place, and time.  Skin: Skin is warm and dry. Capillary refill takes less than 2 seconds.  Psychiatric: She has a normal mood and affect. Her behavior is normal.  Nursing note and vitals reviewed.    Musculoskeletal Exam: C-spine, thoracic, and lumbar spine good ROM.  Shoulder joints, elbow joints, and wrist joints good ROM.  Synovitis of left wrist. MCPs, PIPs, and DIPs good ROM with no synovitis.  Hip joints, knee joints, and ankle joints good ROM with no synovitis. She has thickening of bilateral MTPs.  MTPs, DIPs, and PIPs good ROM with no synovitis.    CDAI Exam: CDAI Homunculus Exam:   Tenderness:  LUE: wrist  Swelling:  LUE: wrist  Joint Counts:  CDAI Tender Joint count: 1 CDAI Swollen Joint count: 1     Investigation: No additional findings. CBC Latest Ref Rng & Units 03/02/2017 10/14/2016 07/16/2016  WBC 3.8 - 10.8 Thousand/uL 8.3 7.2 9.2  Hemoglobin 11.7 - 15.5 g/dL 16.112.1 09.612.9 04.513.2  Hematocrit 35.0 - 45.0 % 35.8 39.3 39.3  Platelets 140 - 400 Thousand/uL 299 354 377   CMP Latest Ref Rng & Units 03/02/2017 10/14/2016 07/16/2016  Glucose 65 - 99 mg/dL 409(W111(H) 95 119(J107(H)  BUN 7 - 25 mg/dL 13 12 15   Creatinine 0.50 - 0.99 mg/dL 4.780.75 2.950.73 6.210.81  Sodium 135 - 146 mmol/L 142 140 142  Potassium 3.5 - 5.3 mmol/L 3.9 4.4 4.4  Chloride 98 - 110 mmol/L 105 104 103  CO2 20 - 32 mmol/L 27 22 22   Calcium 8.6 - 10.4 mg/dL 9.4 9.8 9.8  Total Protein 6.1 - 8.1 g/dL 6.7 6.9 6.9  Total Bilirubin 0.2 - 1.2 mg/dL 0.3 0.3 0.3  Alkaline Phos 33 - 130 U/L - 74 68  AST 10 - 35 U/L 16  15 16   ALT 6 - 29 U/L 20 19 22     Imaging: Xr Foot 2 Views Left  Result Date: 03/17/2017 1st MTP, all PIP, and all DIP joint space narrowing.  No erosive changes. No subtalar or intertarsal joint space narrowing Impression: Findings consistent with osteoarthritis.    Xr Foot 2 Views Right  Result Date: 03/17/2017 1st MTP, all PIP, and all DIP joint space narrowing.  No erosive changes. No subtalar or intertarsal joint space narrowing Impression: Findings consistent with osteoarthritis.    Xr Hand 2 View Left  Result Date: 03/17/2017 Juxta-articular osteopenia. 2nd and 3rd MCP joint space narrowing. All DIP and PIP joint space narrowing.  Intercarpal and radiocarpal joint space narrowing.  Possible erosion vs. Cyst in scaphoid. Impression: Findings consistent  with rheumatoid arthritis and osteoarthritis overlap.    Xr Hand 2 View Right  Result Date: 03/17/2017 Juxta-articular osteopenia. 2nd and 3rd MCP joint space narrowing. All DIP and PIP joint space narrowing.  Intercarpal and radiocarpal joint space narrowing.  Impression: Findings consistent with rheumatoid arthritis and osteoarthritis overlap.     Speciality Comments: No specialty comments available.    Procedures:  No procedures performed Allergies: Patient has no known allergies.   Assessment / Plan:    Visit Diagnoses: Rheumatoid arthritis involving multiple sites with positive rheumatoid factor (HCC): She has synovitis of her left wrist.  All MTPs have synovial thickening.  She continues to have constant pain in her bilateral feet and left wrist.  PLQ caused side effects so she discontinued.  She would like to start Enbrel in combination with MTX. She is going to continue on MTX subq and we are going to apply for Enbrel once weekly injection.  X-rays of her hands reveal findings consistent with rheumatoid arthritis and osteoarthritis with no erosive changes.  The x-rays of her feet reveal findings consistent with osteoarthritis.    Medication counseling:   TB Test: 07/22/16 negative Hepatitis panel: non-reactive 07/16/16 HIV: non-reactive 07/16/16 SPEP: gamma globulin 0.7 07/16/16 Immunoglobulins: WNL 07/16/16  Chest x-ray: 12/25/07 no active lung disease   Does patient have diagnosis of heart failure?  No  Counseled patient that Enbrel is a TNF blocking agent.  Reviewed Enbrel dose of 50 mg once weekly.  Counseled patient on purpose, proper use, and adverse effects of Enbrel.  Reviewed the most common adverse effects including infections, headache, and injection site reactions. Discussed that there is the possibility of an increased risk of malignancy but it is not well understood if this increased risk is due to the medication or the disease state.  Advised patient to get yearly dermatology exams due to risk of skin cancer.  Reviewed the importance of regular labs while on Enbrel therapy.  Advised patient to get standing labs one month after starting Enbrel then every 2 months.  Provided patient with standing lab orders.  Counseled patient that Enbrel should be held prior to scheduled surgery.  Counseled patient to avoid live vaccines while on Enbrel.  Advised patient to get annual influenza vaccine and the pneumococcal vaccine as needed.  Provided patient with medication education material and answered all questions.  Patient voiced understanding.  Patient consented to Enbrel.  Will upload consent into the media tab.  Reviewed storage instructions for Enbrel.  Advised initial injection must be administered in office.  Patient voiced understanding.   Pain in both hands - XR findings consistent with osteoarthritis and rheumatoid arthritis. Plan: XR Hand 2 View Left, XR Hand 2 View Right  Pain in both feet - XR findings are consistent with osteoarthritis. Plan: XR Foot 2 Views Left, XR Foot 2 Views Right  ANA positive - dsDNA 78, C3 and C4, aCL,b2, LA all negative.  High risk medication use - Methotrexate 20 mg subcutaneous every  week, folic acid 2 mg by mouth daily.  CBC and CMP were WNL on 03/02/17.  She will require repeat labs 1 month after starting Enbrel and every 3 months following.    Primary osteoarthritis of both hands: She has PIP and DIP synovial thickening.  She continues to have discomfort in her hands.  XRs of hands today are consistent with osteoarthritis and rheumatoid arthritis overlap.    Other fatigue: Chronic, improved slightly   Primary insomnia: Improved   Other medical  conditions are listed as follows:  History of gastroesophageal reflux (GERD)  Prediabetes  History of vitamin D deficiency  History of hypertension  History of hyperlipidemia     Orders: Orders Placed This Encounter  Procedures  . XR Foot 2 Views Left  . XR Foot 2 Views Right  . XR Hand 2 View Left  . XR Hand 2 View Right   No orders of the defined types were placed in this encounter.   Face-to-face time spent with patient was 30 minutes. Greater than 50% of time was spent in counseling and coordination of care.  Follow-Up Instructions: Return in about 2 months (around 05/15/2017) for Rheumatoid arthritis, Osteoarthritis.  Pollyann SavoyShaili Lylee Corrow, MD  Note - This record has been created using Animal nutritionistDragon software.  Chart creation errors have been sought, but may not always  have been located. Such creation errors do not reflect on  the standard of medical care.

## 2017-03-16 NOTE — Telephone Encounter (Signed)
FMLA paperwork taken care of by [employer]

## 2017-03-17 ENCOUNTER — Ambulatory Visit (INDEPENDENT_AMBULATORY_CARE_PROVIDER_SITE_OTHER): Payer: Self-pay

## 2017-03-17 ENCOUNTER — Telehealth: Payer: Self-pay

## 2017-03-17 ENCOUNTER — Encounter: Payer: Self-pay | Admitting: Rheumatology

## 2017-03-17 ENCOUNTER — Ambulatory Visit: Payer: 59 | Admitting: Rheumatology

## 2017-03-17 VITALS — BP 130/82 | HR 71 | Resp 15 | Ht 68.0 in | Wt 181.0 lb

## 2017-03-17 DIAGNOSIS — Z8639 Personal history of other endocrine, nutritional and metabolic disease: Secondary | ICD-10-CM

## 2017-03-17 DIAGNOSIS — M19041 Primary osteoarthritis, right hand: Secondary | ICD-10-CM

## 2017-03-17 DIAGNOSIS — M79641 Pain in right hand: Secondary | ICD-10-CM | POA: Diagnosis not present

## 2017-03-17 DIAGNOSIS — Z79899 Other long term (current) drug therapy: Secondary | ICD-10-CM | POA: Diagnosis not present

## 2017-03-17 DIAGNOSIS — M79672 Pain in left foot: Secondary | ICD-10-CM | POA: Diagnosis not present

## 2017-03-17 DIAGNOSIS — R768 Other specified abnormal immunological findings in serum: Secondary | ICD-10-CM

## 2017-03-17 DIAGNOSIS — M79642 Pain in left hand: Secondary | ICD-10-CM

## 2017-03-17 DIAGNOSIS — Z8719 Personal history of other diseases of the digestive system: Secondary | ICD-10-CM

## 2017-03-17 DIAGNOSIS — R7303 Prediabetes: Secondary | ICD-10-CM

## 2017-03-17 DIAGNOSIS — R7689 Other specified abnormal immunological findings in serum: Secondary | ICD-10-CM

## 2017-03-17 DIAGNOSIS — M0579 Rheumatoid arthritis with rheumatoid factor of multiple sites without organ or systems involvement: Secondary | ICD-10-CM | POA: Diagnosis not present

## 2017-03-17 DIAGNOSIS — M79671 Pain in right foot: Secondary | ICD-10-CM | POA: Diagnosis not present

## 2017-03-17 DIAGNOSIS — R5383 Other fatigue: Secondary | ICD-10-CM

## 2017-03-17 DIAGNOSIS — R69 Illness, unspecified: Secondary | ICD-10-CM | POA: Diagnosis not present

## 2017-03-17 DIAGNOSIS — F5101 Primary insomnia: Secondary | ICD-10-CM

## 2017-03-17 DIAGNOSIS — M19042 Primary osteoarthritis, left hand: Secondary | ICD-10-CM

## 2017-03-17 DIAGNOSIS — Z8679 Personal history of other diseases of the circulatory system: Secondary | ICD-10-CM

## 2017-03-17 NOTE — Telephone Encounter (Signed)
I was asked to complete a prior authorization request for Enbrel. Authorization was submitted to pts insurance via cover my meds. Will update once we receive a response.   Jaxyn Rout, [place], CPhT 2:03 PM

## 2017-03-17 NOTE — Patient Instructions (Addendum)
Standing Labs We placed an order today for your standing lab work.    Please come back and get your standing labs in February and every 3 months  We have open lab Monday through Friday from 8:30-11:30 AM and 1:30-4 PM at the office of Dr. Shaili Deveshwar.   The office is located at [address], [address], [place], [place] [address] No appointment is necessary.   Labs are drawn by [facility].  You may receive a bill from [facility] for your lab work. If you have any questions regarding directions or hours of operation,  please call [phone].    Etanercept injection What is this medicine? ETANERCEPT (et a NER sept) is used for the treatment of rheumatoid arthritis in adults and children. The medicine is also used to treat psoriatic arthritis, ankylosing spondylitis, and psoriasis. This medicine may be used for other purposes; ask your health care provider or pharmacist if you have questions. COMMON BRAND NAME(S): Enbrel What should I tell my health care provider before I take this medicine? They need to know if you have any of these conditions: -blood disorders -cancer -congestive heart failure -diabetes -exposure to chickenpox -immune system problems -infection -multiple sclerosis -seizure disorder -tuberculosis, a positive skin test for tuberculosis or have recently been in close contact with someone who has tuberculosis -Wegener's granulomatosis -an unusual or allergic reaction to etanercept, latex, other medicines, foods, dyes, or preservatives -pregnant or trying to get pregnant -breast-feeding How should I use this medicine? The medicine is given by injection under the skin. You will be taught how to prepare and give this medicine. Use exactly as directed. Take your medicine at regular intervals. Do not take your medicine more often than directed. It is important that you put your used needles and syringes in a special sharps container. Do not put them in a trash can. If you  do not have a sharps container, call your pharmacist or healthcare provider to get one. A special MedGuide will be given to you by the pharmacist with each prescription and refill. Be sure to read this information carefully each time. Talk to your pediatrician regarding the use of this medicine in children. While this drug may be prescribed for children as young as 4 years of age for selected conditions, precautions do apply. Overdosage: If you think you have taken too much of this medicine contact a poison control center or emergency room at once. NOTE: This medicine is only for you. Do not share this medicine with others. What if I miss a dose? If you miss a dose, contact your health care professional to find out when you should take your next dose. Do not take double or extra doses without advice. What may interact with this medicine? Do not take this medicine with any of the following medications: -anakinra This medicine may also interact with the following medications: -cyclophosphamide -sulfasalazine -vaccines This list may not describe all possible interactions. Give your health care provider a list of all the medicines, herbs, non-prescription drugs, or dietary supplements you use. Also tell them if you smoke, drink alcohol, or use illegal drugs. Some items may interact with your medicine. What should I watch for while using this medicine? Tell your doctor or healthcare professional if your symptoms do not start to get better or if they get worse. You will be tested for tuberculosis (TB) before you start this medicine. If your doctor prescribes any medicine for TB, you should start taking the TB medicine before starting this   medicine. Make sure to finish the full course of TB medicine. Call your doctor or health care professional for advice if you get a fever, chills or sore throat, or other symptoms of a cold or flu. Do not treat yourself. This drug decreases your body's ability to fight  infections. Try to avoid being around people who are sick. What side effects may I notice from receiving this medicine? Side effects that you should report to your doctor or health care professional as soon as possible: -allergic reactions like skin rash, itching or hives, swelling of the face, lips, or tongue -changes in vision -fever, chills or any other sign of infection -numbness or tingling in legs or other parts of the body -red, scaly patches or raised bumps on the skin -shortness of breath or difficulty breathing -swollen lymph nodes in the neck, underarm, or groin areas -unexplained weight loss -unusual bleeding or bruising -unusual swelling or fluid retention in the legs -unusually weak or tired Side effects that usually do not require medical attention (report to your doctor or health care professional if they continue or are bothersome): -dizziness -headache -nausea -redness, itching, or swelling at the injection site -vomiting This list may not describe all possible side effects. Call your doctor for medical advice about side effects. You may report side effects to FDA at 1-800-FDA-1088. Where should I keep my medicine? Keep out of the reach of children. Store between 2 and 8 degrees C (36 and 46 degrees F). Do not freeze or shake. Protect from light. Throw away any unused medicine after the expiration date. You will be instructed on how to store this medicine. NOTE: This sheet is a summary. It may not cover all possible information. If you have questions about this medicine, talk to your doctor, pharmacist, or health care provider.  2018 Elsevier/Gold Standard (2011-09-06 15:33:36)  

## 2017-03-18 NOTE — Telephone Encounter (Signed)
[facility] prior [facility] DEN[facility] for ENBREL. Medication is covered when member has a documented negative TB test (which can include a tuberculosis skin test (PPD), an interferon-release assay (IGRA), or a chest X-Ray) within 6 months of initiating therapy for persons who are naive to biologics and repeated yearly for members with risk factors.   Reference number: [id number] Phone number:[phone]  Will send document to scan center.  Please contact patient and schedule a TB test and submit an appeal with pts name, member ID ([id number]), group name([facility]) and comments, documents, records and other information you want them to consider by mail to:  [facility] [address] [address] [place], [place] [phone]  Or submit an expedited appeal to fax: [phone]  Abran DukeHopkins, Levia Waltermire, CPhT 8:54 AM

## 2017-03-25 ENCOUNTER — Telehealth: Payer: Self-pay | Admitting: Rheumatology

## 2017-03-25 NOTE — Telephone Encounter (Signed)
Patient left a voicemail stating that her insurance company needs additional information for her Enbrel injections to be covered. CB [phone]

## 2017-03-28 ENCOUNTER — Other Ambulatory Visit: Payer: Self-pay | Admitting: *Deleted

## 2017-03-28 DIAGNOSIS — Z9225 Personal history of immunosupression therapy: Secondary | ICD-10-CM | POA: Diagnosis not present

## 2017-03-30 ENCOUNTER — Other Ambulatory Visit: Payer: Self-pay | Admitting: Rheumatology

## 2017-03-30 LAB — QUANTIFERON-TB GOLD PLUS
Mitogen-NIL: >10 IU/mL
NIL: 0.02 [IU]/mL
QUANTIFERON-TB GOLD PLUS: NEGATIVE
TB1-NIL: 0.01 [IU]/mL
TB2-NIL: 0.02 [IU]/mL

## 2017-03-30 MED FILL — RASUVO 20 MG/0.4 ML AUTOINJ: 20 | 28 days supply | Qty: 2 | Fill #0

## 2017-03-30 NOTE — Progress Notes (Signed)
TB gold neg

## 2017-03-30 NOTE — Telephone Encounter (Signed)
Last Visit: 03/17/17 Next visit due March 2019 Labs: 03/02/17 WNL  Okay to refill per Dr. Corliss Skainseveshwar

## 2017-04-06 ENCOUNTER — Encounter: Payer: Self-pay | Admitting: *Deleted

## 2017-04-06 NOTE — Telephone Encounter (Signed)
Appeal letter written

## 2017-04-08 ENCOUNTER — Telehealth: Payer: Self-pay | Admitting: Rheumatology

## 2017-04-08 MED ORDER — ETANERCEPT 50 MG/ML ~~LOC~~ SOCT: 50.0000 mg | SUBCUTANEOUS | 0 refills | Status: DC

## 2017-04-08 NOTE — Telephone Encounter (Signed)
Prescription has been sent to the pharmacy and will schedule nurse visit once prescription has been received in office.

## 2017-04-08 NOTE — Telephone Encounter (Signed)
Emma Warner

## 2017-04-08 NOTE — Telephone Encounter (Signed)
[employer]. Spoke with Inda MerlinZykia who was able to process the prior auth over the phone with pts TBGold  Results. Enbrel has been approved from 04/08/2017 thorough 10/06/2017.  Reference number: [id number] Phone: [phone]  Called pt to update. [facility]. The first refill will come to the clinic. Once received, we will contact pt to schedule nursing visit. Gave pt information to get Enbrel copay assistance. Patient voices understanding and denies any questions at this time.  [facility] [phone]. Thanks!  Toshia Larkin, [place], CPhT 12:57 PM

## 2017-04-08 NOTE — Telephone Encounter (Signed)
Patient called stating that [employer] Pre-cert team wanted Sue Lushndrea to return their call at #[phone] regarding the Enbrel injections.  Patient did not understand why [employer] didn't call  Sue Lushndrea directly but was told that the call needed to be returned today 04/08/17.

## 2017-04-15 ENCOUNTER — Telehealth: Payer: Self-pay | Admitting: Rheumatology

## 2017-04-15 NOTE — Telephone Encounter (Signed)
Patient called checking to see if the Enbrel injection was received.  [facility] said they mailed it out last week and she just wanted to follow-up with it.

## 2017-04-15 NOTE — Telephone Encounter (Signed)
Patient advised that we have not received the Enbrel here in the office. Patient advised we have not been contacted by the pharmacy to schedule delivery. Patient is going to reach out to pharmacy to see where things stand and will contact the office if we need to do anything.

## 2017-04-21 ENCOUNTER — Telehealth: Payer: Self-pay | Admitting: Rheumatology

## 2017-04-21 NOTE — Telephone Encounter (Signed)
Returned patient's call and informed patient that the medication is in the office and she is now scheduled for a nurse visit on 05/03/2017 at 9am.

## 2017-04-21 NOTE — Telephone Encounter (Signed)
Patient left a voicemail checking to see if [facility] injection to our office.

## 2017-05-03 ENCOUNTER — Ambulatory Visit: Payer: 59 | Admitting: *Deleted

## 2017-05-03 VITALS — BP 134/77 | HR 105

## 2017-05-03 DIAGNOSIS — M0579 Rheumatoid arthritis with rheumatoid factor of multiple sites without organ or systems involvement: Secondary | ICD-10-CM

## 2017-05-03 MED ORDER — ETANERCEPT 50 MG/ML ~~LOC~~ SOCT
50.0000 mg | Freq: Once | SUBCUTANEOUS | Status: AC
  Administered 2017-05-03: 50 mg via SUBCUTANEOUS

## 2017-05-03 MED FILL — RASUVO 20 MG/0.4 ML AUTOINJ: 20 | 28 days supply | Qty: 2 | Fill #1

## 2017-05-03 NOTE — Patient Instructions (Signed)
Standing Labs We placed an order today for your standing lab work.    Please come back and get your standing labs in 1 month and every 3 months.   We have open lab Monday through Friday from 8:30-11:30 AM and 1:30-4 PM at the office of Dr. Shaili Deveshwar.   The office is located at [address], [address], [place], [place] [address] No appointment is necessary.   Labs are drawn by [facility].  You may receive a bill from [facility] for your lab work. If you have any questions regarding directions or hours of operation,  please call [phone].     

## 2017-05-03 NOTE — Progress Notes (Signed)
Patient in office for a new start to Enbrel Mini. Patient was given a demonstration to how to use device to administer the medication. Patient able to demonstrate proper technique in administration. Patient gave injection in her right thigh and tolerated injection well. Patient was monitored in office for 30 minutes for adverse reactions and no adverse reactions noted.   Administrations This Visit    Etanercept SOCT 50 mg    Admin Date 05/03/2017 Action Given Dose 50 mg Route Subcutaneous Administered By Henriette CombsHatton, Trenice Mesa L, LPN

## 2017-05-31 MED FILL — RASUVO 20 MG/0.4 ML AUTOINJ: 20 | 28 days supply | Qty: 2 | Fill #2

## 2017-06-01 ENCOUNTER — Telehealth: Payer: Self-pay | Admitting: Rheumatology

## 2017-06-01 ENCOUNTER — Other Ambulatory Visit: Payer: Self-pay

## 2017-06-01 ENCOUNTER — Other Ambulatory Visit: Payer: Self-pay | Admitting: *Deleted

## 2017-06-01 DIAGNOSIS — Z79899 Other long term (current) drug therapy: Secondary | ICD-10-CM

## 2017-06-01 NOTE — Telephone Encounter (Signed)
Patient advised she is due for labs now as she started Enbrel mini 1 month ago. Patient verbalized understanding.

## 2017-06-01 NOTE — Telephone Encounter (Signed)
Patient called requesting a return call to let her know when she is due for her labwork.   

## 2017-06-02 LAB — CBC WITH DIFFERENTIAL/PLATELET
BASOS ABS: 31 {cells}/uL (ref 0–200)
Basophils Relative: 0.4 %
EOS ABS: 131 {cells}/uL (ref 15–500)
EOS PCT: 1.7 %
HEMATOCRIT: 35.3 % (ref 35.0–45.0)
HEMOGLOBIN: 12.2 g/dL (ref 11.7–15.5)
Lymphs Abs: 2587 cells/uL (ref 850–3900)
MCH: 29.5 pg (ref 27.0–33.0)
MCHC: 34.6 g/dL (ref 32.0–36.0)
MCV: 85.5 fL (ref 80.0–100.0)
MONOS PCT: 10.7 %
MPV: 10.5 fL (ref 7.5–12.5)
NEUTROS ABS: 4127 {cells}/uL (ref 1500–7800)
NEUTROS PCT: 53.6 %
Platelets: 296 10*3/uL (ref 140–400)
RBC: 4.13 10*6/uL (ref 3.80–5.10)
RDW: 14 % (ref 11.0–15.0)
Total Lymphocyte: 33.6 %
WBC mixed population: 824 cells/uL (ref 200–950)
WBC: 7.7 10*3/uL (ref 3.8–10.8)

## 2017-06-02 LAB — COMPLETE METABOLIC PANEL WITHOUT GFR
AG Ratio: 1.8 (calc) (ref 1.0–2.5)
ALT: 29 U/L (ref 6–29)
AST: 19 U/L (ref 10–35)
Albumin: 4.4 g/dL (ref 3.6–5.1)
Alkaline phosphatase (APISO): 73 U/L (ref 33–130)
BUN: 17 mg/dL (ref 7–25)
CO2: 29 mmol/L (ref 20–32)
Calcium: 9.3 mg/dL (ref 8.6–10.4)
Chloride: 106 mmol/L (ref 98–110)
Creat: 0.95 mg/dL (ref 0.50–0.99)
GFR, Est African American: 75 mL/min/1.73m2 (ref >=60)
GFR, Est Non African American: 65 mL/min/1.73m2 (ref >=60)
Globulin: 2.4 g/dL (ref 1.9–3.7)
Glucose, Bld: 67 mg/dL (ref 65–99)
Potassium: 4.2 mmol/L (ref 3.5–5.3)
Sodium: 141 mmol/L (ref 135–146)
Total Bilirubin: 0.2 mg/dL (ref 0.2–1.2)
Total Protein: 6.8 g/dL (ref 6.1–8.1)

## 2017-06-23 ENCOUNTER — Telehealth: Payer: Self-pay

## 2017-06-23 MED ORDER — ETANERCEPT 50 MG/ML ~~LOC~~ SOCT: 50.0000 mg | SUBCUTANEOUS | 0 refills | Status: DC

## 2017-06-23 NOTE — Telephone Encounter (Signed)
Refill request received via fax.   Last visit: 03/17/2017 Next visit: message sent to front desk to schedule patient. Labs: 06/01/2017 WNL  TB Gold: 03/28/2017 Negative   Okay to refill per Dr. Corliss Skainseveshwar.

## 2017-06-24 ENCOUNTER — Telehealth: Payer: Self-pay | Admitting: Rheumatology

## 2017-06-24 NOTE — Telephone Encounter (Signed)
-----   Message from Ellen HenriMarissa C Graves, CMA sent at 06/23/2017 11:27 AM EDT ----- Please call and schedule patient a follow up appt, she is due this month. Thanks!

## 2017-06-24 NOTE — Telephone Encounter (Signed)
LMOM for patient to call and schedule follow-up appointment.   °

## 2017-06-24 NOTE — Progress Notes (Signed)
Office Visit Note  Patient: Emma Warner             Date of Birth: 1955-03-19           MRN: [id number]             PCP: Lizbeth Bark, NP Referring: Glenetta Borg Cathleen Corti, NP Visit Date: 07/07/2017 Occupation: @GUAROCC @    Subjective:  Left wrist pain   History of Present Illness: CHANIE SOUCEK is a 62 y.o. female with history of seropositive rheumatoid arthritis and osteoarthritis.  Patient was started on Enbrel on 05/03/2017.  She reports that she has no significant benefit and since starting on Enbrel.  She continues to inject Rasuvo 20 mg weekly.  She denies any joint swelling at this time.  She says she continues to have some discomfort in her left wrist.  She states that her pain in her feet has improved significantly.  She states she no longer has pain going up and down stairs.  She states that her morning stiffness has improved and is only lasting about 30 minutes now.  She reports her fatigue is also improved significantly.  She continues to have interrupted sleep at night though.  She states that she has been able to work full-time now and do activities she was unable to do before starting on Enbrel.    Activities of Daily Living:  Patient reports morning stiffness for 30 minutes.   Patient Denies nocturnal pain.  Difficulty dressing/grooming: Denies Difficulty climbing stairs: Denies Difficulty getting out of chair: Denies Difficulty using hands for taps, buttons, cutlery, and/or writing: Denies   Review of Systems  Constitutional: Positive for fatigue.  HENT: Negative for mouth sores, mouth dryness and nose dryness.   Eyes: Negative for pain, visual disturbance and dryness.  Respiratory: Negative for cough, hemoptysis, shortness of breath and difficulty breathing.   Cardiovascular: Negative for chest pain, palpitations, hypertension and swelling in legs/feet.  Gastrointestinal: Negative for blood in stool, constipation and diarrhea.  Endocrine: Negative for  increased urination.  Genitourinary: Negative for painful urination.  Musculoskeletal: Positive for arthralgias, joint pain and morning stiffness. Negative for joint swelling, myalgias, muscle weakness, muscle tenderness and myalgias.  Skin: Negative for color change, pallor, rash, hair loss, nodules/bumps, skin tightness, ulcers and sensitivity to sunlight.  Allergic/Immunologic: Negative for susceptible to infections.  Neurological: Negative for dizziness, numbness, headaches and weakness.  Hematological: Negative for swollen glands.  Psychiatric/Behavioral: Positive for sleep disturbance. Negative for depressed mood. The patient is not nervous/anxious.     PMFS History:  Patient Active Problem List   Diagnosis Date Noted  . Primary osteoarthritis of both hands 07/16/2016  . Rheumatoid arthritis involving multiple sites with positive rheumatoid factor (HCC) 07/15/2016  . ANA positive 07/15/2016  . History of gastroesophageal reflux (GERD) 07/15/2016  . Ds DNA antibody positive/ [facility] labs show patient does not have lupus 10/2016 07/15/2016  . Other fatigue 07/15/2016  . High risk medication use 07/15/2016  . Primary insomnia 07/15/2016  . Hypertension   . Hyperlipidemia   . GERD (gastroesophageal reflux disease)   . Prediabetes   . Vitamin D deficiency   . ALLERGIC RHINITIS 12/25/2007  . PLEURAL EFFUSION 12/25/2007    Past Medical History:  Diagnosis Date  . Arthritis   . GERD (gastroesophageal reflux disease)   . Hyperlipidemia   . Hypertension   . Prediabetes   . Vitamin D deficiency     Family History  Problem Relation Age of Onset  .  Diabetes Mother   . Rheum arthritis Mother   . Cancer Father        colon  . Cirrhosis Brother        alcoholic  . Alcoholism Brother    Past Surgical History:  Procedure Laterality Date  . APPENDECTOMY     Social History   Social History Narrative  . Not on file     Objective: Vital Signs: BP 131/82 (BP Location: Left  Arm, Patient Position: Sitting, Cuff Size: Normal)   Pulse 96   Resp 16   Ht 5\' 8"  (1.727 m)   Wt 178 lb (80.7 kg)   BMI 27.06 kg/m    Physical Exam  Constitutional: She is oriented to person, place, and time. She appears well-developed and well-nourished.  HENT:  Head: Normocephalic and atraumatic.  Eyes: Conjunctivae and EOM are normal.  Neck: Normal range of motion.  Cardiovascular: Normal rate, regular rhythm, normal heart sounds and intact distal pulses.  Pulmonary/Chest: Effort normal and breath sounds normal.  Abdominal: Soft. Bowel sounds are normal.  Lymphadenopathy:    She has no cervical adenopathy.  Neurological: She is alert and oriented to person, place, and time.  Skin: Skin is warm and dry. Capillary refill takes less than 2 seconds.  Psychiatric: She has a normal mood and affect. Her behavior is normal.  Nursing note and vitals reviewed.    Musculoskeletal Exam: C-spine, thoracic spine, lumbar spine good range of motion.  No midline spinal tenderness.  No SI joint tenderness.  Shoulder joints, elbow joints, wrist joints, MCPs, PIPs, DIPs good range of motion with no synovitis. Mild PIP and DIP synovial thickening consistent with osteoarthritis.  Complete fist formation bilaterally.  Hip joints, knee joints, ankle joints, MTPs, PIPs, DIPs good range of motion with no synovitis.  No warmth or effusion of bilateral knees.  No knee crepitus.  No tenderness of trochanteric bursa bilaterally.  CDAI Exam: CDAI Homunculus Exam:   Tenderness:  LUE: wrist  Swelling:  LUE: wrist  Joint Counts:  CDAI Tender Joint count: 1 CDAI Swollen Joint count: 1  Global Assessments:  Patient Global Assessment: 4 Provider Global Assessment: 4  CDAI Calculated Score: 10    Investigation: No additional findings. CBC Latest Ref Rng & Units 06/01/2017 03/02/2017 10/14/2016  WBC 3.8 - 10.8 Thousand/uL 7.7 8.3 7.2  Hemoglobin 11.7 - 15.5 g/dL 27.212.2 53.612.1 64.412.9  Hematocrit 35.0 - 45.0  % 35.3 35.8 39.3  Platelets 140 - 400 Thousand/uL 296 299 354   CMP Latest Ref Rng & Units 06/01/2017 03/02/2017 10/14/2016  Glucose 65 - 99 mg/dL 67 034(V111(H) 95  BUN 7 - 25 mg/dL 17 13 12   Creatinine 0.50 - 0.99 mg/dL 4.250.95 9.560.75 3.870.73  Sodium 135 - 146 mmol/L 141 142 140  Potassium 3.5 - 5.3 mmol/L 4.2 3.9 4.4  Chloride 98 - 110 mmol/L 106 105 104  CO2 20 - 32 mmol/L 29 27 22   Calcium 8.6 - 10.4 mg/dL 9.3 9.4 9.8  Total Protein 6.1 - 8.1 g/dL 6.8 6.7 6.9  Total Bilirubin 0.2 - 1.2 mg/dL 0.2 0.3 0.3  Alkaline Phos 33 - 130 U/L - - 74  AST 10 - 35 U/L 19 16 15   ALT 6 - 29 U/L 29 20 19     Imaging: No results found.  Speciality Comments: No specialty comments available.    Procedures:  No procedures performed Allergies: Patient has no known allergies.   Assessment / Plan:     Visit Diagnoses: Rheumatoid arthritis  involving multiple sites with positive rheumatoid factor (HCC): She has noticed significant improvement since starting on Enbrel subcutaneous injections once a week on 05/03/17.  She continues to inject Rasuvo 20 mg/0.4 ml weekly and folic acid 2 mg daily.  She has some mild discomfort and tenderness on the radial aspect of her left wrist.  Her neck pain and feet pain has resolved. She does not have any other joint pain or joint swelling at this time.  Her morning stiffness has improved significantly as well.  She has been able to return to work full time and return to her normal activities. She will continue on his current treatment regimen.  She is advised to let us know if she develops increased joint pain or joint swelling.   High risk medication use - Enbel and MTX subq. TB gold negative 03/28/17. CBC and CMP: 06/01/17 within normal limits.  She will return in June and every 3 months to monitor for drug toxicity.  Ds DNA antibody positive/ [facility] labs show patient does not have lupus 10/2016  Primary osteoarthritis of both hands: She has mild PIP and DIP synovial thickening. Joint  protection and muscle strengthening were discussed.    Other fatigue: Improved  Primary insomnia: She continues to have interrupted sleep but her fatigue has improved.  Other medical conditions are listed as follows:  History of gastroesophageal reflux (GERD)  Prediabetes  History of vitamin D deficiency  History of hypertension  History of hyperlipidemia    Orders: No orders of the defined types were placed in this encounter.  No orders of the defined types were placed in this encounter.    Follow-Up Instructions: Return in about 5 months (around 12/07/2017) for Rheumatoid arthritis, Osteoarthritis.   Gearldine Bienenstock, PA-C   I examined and evaluated the patient with Sherron Ales PA. The plan of care was discussed as noted above.  Pollyann Savoy, MD  Note - This record has been created using Animal nutritionist.  Chart creation errors have been sought, but may not always  have been located. Such creation errors do not reflect on  the standard of medical care.

## 2017-07-01 ENCOUNTER — Other Ambulatory Visit: Payer: Self-pay | Admitting: Rheumatology

## 2017-07-04 MED FILL — RASUVO 20 MG/0.4 ML AUTOINJ: 20 | 28 days supply | Qty: 2 | Fill #0

## 2017-07-04 NOTE — Telephone Encounter (Signed)
Last Visit: 03/17/17 Next visit: 07/07/17 Labs: 06/01/17 WNL   Okay to refill per Dr. Corliss Skainseveshwar

## 2017-07-07 ENCOUNTER — Other Ambulatory Visit: Payer: Self-pay | Admitting: Rheumatology

## 2017-07-07 ENCOUNTER — Ambulatory Visit: Payer: 59 | Admitting: Physician Assistant

## 2017-07-07 ENCOUNTER — Encounter: Payer: Self-pay | Admitting: Physician Assistant

## 2017-07-07 VITALS — BP 131/82 | HR 96 | Resp 16 | Ht 68.0 in | Wt 178.0 lb

## 2017-07-07 DIAGNOSIS — R7303 Prediabetes: Secondary | ICD-10-CM | POA: Diagnosis not present

## 2017-07-07 DIAGNOSIS — M19042 Primary osteoarthritis, left hand: Secondary | ICD-10-CM

## 2017-07-07 DIAGNOSIS — Z8639 Personal history of other endocrine, nutritional and metabolic disease: Secondary | ICD-10-CM | POA: Diagnosis not present

## 2017-07-07 DIAGNOSIS — Z8719 Personal history of other diseases of the digestive system: Secondary | ICD-10-CM | POA: Diagnosis not present

## 2017-07-07 DIAGNOSIS — M19041 Primary osteoarthritis, right hand: Secondary | ICD-10-CM

## 2017-07-07 DIAGNOSIS — F5101 Primary insomnia: Secondary | ICD-10-CM

## 2017-07-07 DIAGNOSIS — Z79899 Other long term (current) drug therapy: Secondary | ICD-10-CM | POA: Diagnosis not present

## 2017-07-07 DIAGNOSIS — R69 Illness, unspecified: Secondary | ICD-10-CM | POA: Diagnosis not present

## 2017-07-07 DIAGNOSIS — R5383 Other fatigue: Secondary | ICD-10-CM

## 2017-07-07 DIAGNOSIS — R768 Other specified abnormal immunological findings in serum: Secondary | ICD-10-CM

## 2017-07-07 DIAGNOSIS — R7689 Other specified abnormal immunological findings in serum: Secondary | ICD-10-CM

## 2017-07-07 DIAGNOSIS — Z8679 Personal history of other diseases of the circulatory system: Secondary | ICD-10-CM | POA: Diagnosis not present

## 2017-07-07 DIAGNOSIS — M0579 Rheumatoid arthritis with rheumatoid factor of multiple sites without organ or systems involvement: Secondary | ICD-10-CM | POA: Diagnosis not present

## 2017-07-07 NOTE — Telephone Encounter (Signed)
Last Visit: 07/07/17 Next visit: 12/07/17  Okay to refill per Dr. Corliss Skainseveshwar

## 2017-07-07 NOTE — Patient Instructions (Signed)
Standing Labs We placed an order today for your standing lab work.    Please come back and get your standing labs in June and every 3 months  We have open lab Monday through Friday from 8:30-11:30 AM and 1:30-4:00 PM  at the office of Dr. Shaili Deveshwar.   You may experience shorter wait times on Monday and Friday afternoons. The office is located at [address], [address], [place], [place] [address] No appointment is necessary.   Labs are drawn by [facility].  You may receive a bill from [facility] for your lab work. If you have any questions regarding directions or hours of operation,  please call [phone].    

## 2017-08-04 MED FILL — RASUVO 20 MG/0.4 ML AUTOINJ: 20 | 28 days supply | Qty: 2 | Fill #1

## 2017-08-23 ENCOUNTER — Telehealth: Payer: Self-pay | Admitting: Rheumatology

## 2017-08-23 NOTE — Telephone Encounter (Signed)
Patient called stating her insurance company wants her medications to [facility].  Patient states they need a new prescription for Rasuvo to be faxed to [phone].

## 2017-08-24 MED ORDER — METHOTREXATE (PF) 20 MG/0.4ML ~~LOC~~ SOAJ: SUBCUTANEOUS | 0 refills | Status: DC

## 2017-08-24 NOTE — Telephone Encounter (Signed)
Last Visit: 07/07/17 Next visit: 12/07/17 Labs: 06/01/17 WNL  Okay to refill per Dr. Corliss Skainseveshwar

## 2017-08-30 ENCOUNTER — Telehealth: Payer: Self-pay | Admitting: Rheumatology

## 2017-08-30 NOTE — Telephone Encounter (Signed)
Patient called stating she just jgot off the phone with her her insurance company [employer] who told her they will no longer cover her Meth[facility]ate [facility].  They told her [facility] and the cost is more.  Patient states she wants to discuss possibly changing medications.

## 2017-08-30 NOTE — Telephone Encounter (Addendum)
Pharmacy calling requesting okay to fill  Resuvo due to reaction with ASA.  Patient needs it shipped today. Please call to advise.

## 2017-08-30 NOTE — Telephone Encounter (Signed)
Spoke with pharmacy ad advised okay to ship Rasuvo.

## 2017-08-31 NOTE — Telephone Encounter (Signed)
Patient advised that Rasuvo has a copay assistance card. She may access it at [email or web address]. Patient will do that and provide information to pharmacy.

## 2017-09-02 ENCOUNTER — Other Ambulatory Visit: Payer: Self-pay

## 2017-09-02 DIAGNOSIS — Z79899 Other long term (current) drug therapy: Secondary | ICD-10-CM

## 2017-09-03 LAB — COMPLETE METABOLIC PANEL WITH GFR
AG RATIO: 2.1 (calc) (ref 1.0–2.5)
ALT: 19 U/L (ref 6–29)
AST: 15 U/L (ref 10–35)
Albumin: 4.5 g/dL (ref 3.6–5.1)
Alkaline phosphatase (APISO): 72 U/L (ref 33–130)
BILIRUBIN TOTAL: 0.3 mg/dL (ref 0.2–1.2)
BUN: 18 mg/dL (ref 7–25)
CO2: 25 mmol/L (ref 20–32)
CREATININE: 0.79 mg/dL (ref 0.50–0.99)
Calcium: 9.2 mg/dL (ref 8.6–10.4)
Chloride: 108 mmol/L (ref 98–110)
GFR, EST NON AFRICAN AMERICAN: 81 mL/min/{1.73_m2} (ref >=60)
GFR, Est African American: 94 mL/min/{1.73_m2} (ref >=60)
GLOBULIN: 2.1 g/dL (ref 1.9–3.7)
Glucose, Bld: 103 mg/dL — ABNORMAL HIGH (ref 65–99)
Potassium: 4.2 mmol/L (ref 3.5–5.3)
Sodium: 140 mmol/L (ref 135–146)
Total Protein: 6.6 g/dL (ref 6.1–8.1)

## 2017-09-03 LAB — CBC WITH DIFFERENTIAL/PLATELET
BASOS PCT: 0.5 %
Basophils Absolute: 37 cells/uL (ref 0–200)
EOS PCT: 2.3 %
Eosinophils Absolute: 168 cells/uL (ref 15–500)
HCT: 35.7 % (ref 35.0–45.0)
HEMOGLOBIN: 12.2 g/dL (ref 11.7–15.5)
Lymphs Abs: 2081 cells/uL (ref 850–3900)
MCH: 29.8 pg (ref 27.0–33.0)
MCHC: 34.2 g/dL (ref 32.0–36.0)
MCV: 87.3 fL (ref 80.0–100.0)
MONOS PCT: 9.6 %
MPV: 10.8 fL (ref 7.5–12.5)
NEUTROS ABS: 4314 {cells}/uL (ref 1500–7800)
Neutrophils Relative %: 59.1 %
PLATELETS: 325 10*3/uL (ref 140–400)
RBC: 4.09 10*6/uL (ref 3.80–5.10)
RDW: 13.4 % (ref 11.0–15.0)
TOTAL LYMPHOCYTE: 28.5 %
WBC mixed population: 701 cells/uL (ref 200–950)
WBC: 7.3 10*3/uL (ref 3.8–10.8)

## 2017-09-04 NOTE — Progress Notes (Signed)
WNLs

## 2017-09-26 ENCOUNTER — Other Ambulatory Visit: Payer: Self-pay | Admitting: *Deleted

## 2017-09-26 MED ORDER — ETANERCEPT 50 MG/ML ~~LOC~~ SOCT: 50.0000 mg | SUBCUTANEOUS | 0 refills | Status: DC

## 2017-09-26 NOTE — Telephone Encounter (Signed)
Refill request received via fax  Last Visit: 07/07/17 Next visit: 12/07/17 Labs: 09/02/17 WNL TB Gold: 03/28/17 Neg   Okay to refill per Dr. Corliss Skainseveshwar

## 2017-10-10 ENCOUNTER — Telehealth: Payer: Self-pay | Admitting: Pharmacy Technician

## 2017-10-10 NOTE — Telephone Encounter (Signed)
Received a Prior Authorization request from [facility] for [employer] Mini. Authorization has been submitted to patient's insurance via Cover My Meds. Will update once we receive a response.  8:33 AM Dorthula Nettlesachael N Alexzander Dolinger, CPhT

## 2017-10-11 ENCOUNTER — Other Ambulatory Visit: Payer: Self-pay | Admitting: Rheumatology

## 2017-10-11 NOTE — Telephone Encounter (Signed)
Last Visit: 07/07/17 Next visit:12/07/17  Okay to refill per Dr. Corliss Skainseveshwar

## 2017-10-12 ENCOUNTER — Telehealth: Payer: Self-pay | Admitting: Rheumatology

## 2017-10-12 NOTE — Telephone Encounter (Signed)
Called patient and scheduled patient an appointment for 10/13/2017 @ 10:20am.

## 2017-10-12 NOTE — Telephone Encounter (Signed)
Patient called stating she hasn't been feeling well for about a week.  Patient states she has had pain in her left wrist and bilateral hips.  Patient is asking if Dr. Corliss Skainseveshwar would recommend taking Prednisone.   Patient is also asking if she should take her Methotrexate and Enbrel injections on Friday 10/14/17.

## 2017-10-12 NOTE — Telephone Encounter (Signed)
Please call patient to schedule an appointment for evaluation tomorrow or Friday.

## 2017-10-13 ENCOUNTER — Ambulatory Visit: Payer: 59 | Admitting: Physician Assistant

## 2017-10-13 ENCOUNTER — Encounter: Payer: Self-pay | Admitting: Physician Assistant

## 2017-10-13 ENCOUNTER — Encounter (INDEPENDENT_AMBULATORY_CARE_PROVIDER_SITE_OTHER): Payer: Self-pay

## 2017-10-13 VITALS — BP 132/68 | HR 88 | Resp 16 | Ht 67.0 in | Wt 175.0 lb

## 2017-10-13 DIAGNOSIS — R69 Illness, unspecified: Secondary | ICD-10-CM | POA: Diagnosis not present

## 2017-10-13 DIAGNOSIS — Z8709 Personal history of other diseases of the respiratory system: Secondary | ICD-10-CM

## 2017-10-13 DIAGNOSIS — R7303 Prediabetes: Secondary | ICD-10-CM | POA: Diagnosis not present

## 2017-10-13 DIAGNOSIS — Z8639 Personal history of other endocrine, nutritional and metabolic disease: Secondary | ICD-10-CM

## 2017-10-13 DIAGNOSIS — M0579 Rheumatoid arthritis with rheumatoid factor of multiple sites without organ or systems involvement: Secondary | ICD-10-CM

## 2017-10-13 DIAGNOSIS — Z8719 Personal history of other diseases of the digestive system: Secondary | ICD-10-CM

## 2017-10-13 DIAGNOSIS — R768 Other specified abnormal immunological findings in serum: Secondary | ICD-10-CM | POA: Diagnosis not present

## 2017-10-13 DIAGNOSIS — M19041 Primary osteoarthritis, right hand: Secondary | ICD-10-CM | POA: Diagnosis not present

## 2017-10-13 DIAGNOSIS — R5383 Other fatigue: Secondary | ICD-10-CM | POA: Diagnosis not present

## 2017-10-13 DIAGNOSIS — M7061 Trochanteric bursitis, right hip: Secondary | ICD-10-CM

## 2017-10-13 DIAGNOSIS — M19071 Primary osteoarthritis, right ankle and foot: Secondary | ICD-10-CM

## 2017-10-13 DIAGNOSIS — M19042 Primary osteoarthritis, left hand: Secondary | ICD-10-CM

## 2017-10-13 DIAGNOSIS — Z79899 Other long term (current) drug therapy: Secondary | ICD-10-CM

## 2017-10-13 DIAGNOSIS — M19072 Primary osteoarthritis, left ankle and foot: Secondary | ICD-10-CM

## 2017-10-13 DIAGNOSIS — F5101 Primary insomnia: Secondary | ICD-10-CM

## 2017-10-13 DIAGNOSIS — Z8679 Personal history of other diseases of the circulatory system: Secondary | ICD-10-CM

## 2017-10-13 MED ORDER — LIDOCAINE HCL 1 % IJ SOLN
1.5000 mL | INTRAMUSCULAR | Status: AC | PRN
  Administered 2017-10-13: 1.5 mL

## 2017-10-13 MED ORDER — TRIAMCINOLONE ACETONIDE 40 MG/ML IJ SUSP
60.0000 mg | INTRAMUSCULAR | Status: AC | PRN
  Administered 2017-10-13: 60 mg via INTRA_ARTICULAR

## 2017-10-13 NOTE — Progress Notes (Signed)
Office Visit Note  Patient: Emma Warner             Date of Birth: Jun 23, 1955           MRN: [id number]             PCP: Lizbeth Bark, NP Referring: Glenetta Borg Cathleen Corti, NP Visit Date: 10/13/2017 Occupation: @GUAROCC @  Subjective:  Right trochanteric bursitis   History of Present Illness: Emma Warner is a 62 y.o. female with history of seropositive rheumatoid arthritis and osteoarthritis.  She continues to inject Enbrel subcutaneously once weekly and Rasuvo 20 mg once weekly. She has been tolerating these medications and has not missed any doses. She is due for her next injections tomorrow.  She states that several weeks ago she did not have air conditioning for 8 days which she feels like to her rheumatoid arthritis flare.  She states she noticed swelling and discomfort in bilateral feet, left wrist, and neck pain and stiffness.  She states that her fatigue is also been worsening.  She states that the pain and swelling has slowly been improving over time.  She states that she has having right trochanteric bursitis which has been keeping her up at night.    Activities of Daily Living:  Patient reports morning stiffness for 1 hour.   Patient Denies nocturnal pain.  Difficulty dressing/grooming: Denies Difficulty climbing stairs: Reports Difficulty getting out of chair: Reports Difficulty using hands for taps, buttons, cutlery, and/or writing: Reports  Review of Systems  Constitutional: Positive for fatigue.  HENT: Negative for mouth sores, mouth dryness and nose dryness.   Eyes: Negative for pain, visual disturbance and dryness.  Respiratory: Negative for cough, hemoptysis, shortness of breath and difficulty breathing.   Cardiovascular: Positive for swelling in legs/feet. Negative for chest pain, palpitations and hypertension.  Gastrointestinal: Negative for blood in stool, constipation and diarrhea.  Endocrine: Negative for increased urination.  Genitourinary:  Negative for difficulty urinating and painful urination.  Musculoskeletal: Positive for arthralgias, joint pain, joint swelling and morning stiffness. Negative for myalgias, muscle weakness, muscle tenderness and myalgias.  Skin: Negative for color change, pallor, rash, hair loss, nodules/bumps, skin tightness, ulcers and sensitivity to sunlight.  Allergic/Immunologic: Negative for susceptible to infections.  Neurological: Negative for dizziness, numbness, headaches and weakness.  Hematological: Negative for bruising/bleeding tendency and swollen glands.  Psychiatric/Behavioral: Positive for sleep disturbance. Negative for depressed mood. The patient is not nervous/anxious.     PMFS History:  Patient Active Problem List   Diagnosis Date Noted  . Primary osteoarthritis of both hands 07/16/2016  . Rheumatoid arthritis involving multiple sites with positive rheumatoid factor (HCC) 07/15/2016  . ANA positive 07/15/2016  . History of gastroesophageal reflux (GERD) 07/15/2016  . Ds DNA antibody positive/ [facility] labs show patient does not have lupus 10/2016 07/15/2016  . Other fatigue 07/15/2016  . High risk medication use 07/15/2016  . Primary insomnia 07/15/2016  . Hypertension   . Hyperlipidemia   . GERD (gastroesophageal reflux disease)   . Prediabetes   . Vitamin D deficiency   . ALLERGIC RHINITIS 12/25/2007  . PLEURAL EFFUSION 12/25/2007    Past Medical History:  Diagnosis Date  . Arthritis   . GERD (gastroesophageal reflux disease)   . Hyperlipidemia   . Hypertension   . Prediabetes   . Vitamin D deficiency     Family History  Problem Relation Age of Onset  . Diabetes Mother   . Rheum arthritis Mother   . Cancer  Father        colon  . Cirrhosis Brother        alcoholic  . Alcoholism Brother    Past Surgical History:  Procedure Laterality Date  . APPENDECTOMY     Social History   Social History Narrative  . Not on file    Objective: Vital Signs: BP 132/68 (BP  Location: Left Arm, Patient Position: Sitting, Cuff Size: Normal)   Pulse 88   Resp 16   Ht 5\' 7"  (1.702 m)   Wt 175 lb (79.4 kg)   BMI 27.41 kg/m    Physical Exam  Constitutional: She is oriented to person, place, and time. She appears well-developed and well-nourished.  HENT:  Head: Normocephalic and atraumatic.  Eyes: Conjunctivae and EOM are normal.  Neck: Normal range of motion.  Cardiovascular: Normal rate, regular rhythm, normal heart sounds and intact distal pulses.  Pulmonary/Chest: Effort normal and breath sounds normal.  Abdominal: Soft. Bowel sounds are normal.  Lymphadenopathy:    She has no cervical adenopathy.  Neurological: She is alert and oriented to person, place, and time.  Skin: Skin is warm and dry. Capillary refill takes less than 2 seconds.  Psychiatric: She has a normal mood and affect. Her behavior is normal.  Nursing note and vitals reviewed.    Musculoskeletal Exam: C-spine, thoracic spine, lumbar spine good range of motion.  No midline spinal tenderness.  No SI joint tenderness.  Shoulder joints, elbow joints, wrist joints, MCPs, PIPs, DIPs good range of motion no synovitis or tenderness.  Hip joints good range of motion with no discomfort.  She has tenderness of right trochanteric bursa on exam.  Knee joints, ankle joints, MTPs, PIPs, DIPs good range of motion with no synovitis.  No warmth or effusion of bilateral knee joints.  She has tenderness of bilateral second and third MTP joints.  CDAI Exam: CDAI Homunculus Exam:   Joint Counts:  CDAI Tender Joint count: 0 CDAI Swollen Joint count: 0  Global Assessments:  Patient Global Assessment: 7 Provider Global Assessment: 7  CDAI Calculated Score: 14   Investigation: No additional findings.  Imaging: No results found.  Recent Labs: Lab Results  Component Value Date   WBC 7.3 09/02/2017   HGB 12.2 09/02/2017   PLT 325 09/02/2017   NA 140 09/02/2017   K 4.2 09/02/2017   CL 108 09/02/2017    CO2 25 09/02/2017   GLUCOSE 103 (H) 09/02/2017   BUN 18 09/02/2017   CREATININE 0.79 09/02/2017   BILITOT 0.3 09/02/2017   ALKPHOS 74 10/14/2016   AST 15 09/02/2017   ALT 19 09/02/2017   PROT 6.6 09/02/2017   ALBUMIN 4.4 10/14/2016   CALCIUM 9.2 09/02/2017   GFRAA 94 09/02/2017   QFTBGOLDPLUS NEGATIVE 03/28/2017    Speciality Comments: No specialty comments available.  Procedures:  Large Joint Inj: R greater trochanter on 10/13/2017 11:21 AM Indications: pain Details: 27 G 1.5 in needle, lateral approach  Arthrogram: No  Medications: 1.5 mL lidocaine 1 %; 60 mg triamcinolone acetonide 40 MG/ML Aspirate: 0 mL Outcome: tolerated well, no immediate complications Procedure, treatment alternatives, risks and benefits explained, specific risks discussed. Consent was given by the patient. Immediately prior to procedure a time out was called to verify the correct patient, procedure, equipment, support staff and site/side marked as required. Patient was prepped and draped in the usual sterile fashion.     Allergies: Patient has no known allergies.   Assessment / Plan:  Visit Diagnoses: Rheumatoid arthritis involving multiple sites with positive rheumatoid factor (HCC): She has no synovitis on exam today.  She reports that her rheumatoid arthritis flare started about 2 weeks ago with pain and swelling is improved significantly.  At the time she was having pain and swelling in bilateral feet and left wrist.  She continues to have tenderness of bilateral second third MTP joints.  She continues to inject Enbrel subcutaneously once weekly and injects Rasuvo 20 mg once weekly.  She is due for her next injections tomorrow.  She was advised to stay on this current treatment regimen. She was requesting a prednisone taper today, but she has no active synovitis on exam.  She is having right trochanteric bursitis, and a cortisone injection was performed today.  She was advised to notify us if she  continues to have increased joint pain and joint swelling next week and we will send in a short taper of prednisone.    High risk medication use - Started Enbrel on 05/03/17. She injects Rasuvo 20 mg/0.4 ml weekly and takes folic acid 2 mg daily. TB gold negative 03/28/17.  CBC and CMP were within normal limits on 09/02/2017.  She will return for lab work in September and every 3 months to monitor for drug toxicity.  Ds DNA antibody positive/ [facility] labs show patient does not have lupus 10/2016  Primary osteoarthritis of both hands: She has PIP and DIP synovial thickening consistent with osteoarthritis of bilateral hands.  She is complete fist formation bilaterally.  Joint protection and muscle strengthening were discussed.  Primary osteoarthritis of both feet: She has PIP and DIP synovial thickening consistent with osteoarthritis of bilateral feet.  She has some tenderness of the MTPs on exam today.  She wears proper fitting shoes.  Trochanteric bursitis, right hip: She has tenderness of the right trochanteric bursa on exam today.  She has full hip ROM with no discomfort.  She requested a cortisone injection today in the office.  She tolerated the procedure well.  Potential side effects were discussed.  She was given a handout of exercises that she can perform at home. We discussed PT in the future if she continues to have recurrences of bursitis.  Other fatigue: She has had worsening fatigue for the past several weeks.  She has not been sleeping as well lately due to right sided trochanteric bursitis.    Other medical conditions are listed as follows:   Primary insomnia  History of gastroesophageal reflux (GERD)  Prediabetes  History of vitamin D deficiency  History of hypertension  History of hyperlipidemia  History of pleural effusion /2009      Orders: Orders Placed This Encounter  Procedures  . Large Joint Inj   No orders of the defined types were placed in this  encounter.   Face-to-face time spent with patient was 30  minutes. Greater than 50% of time was spent in counseling and coordination of care.  Follow-Up Instructions: Return in about 5 months (around 03/15/2018) for Rheumatoid arthritis, Osteoarthritis.   Gearldine Bienenstockaylor M Jayvion Stefanski, PA-C   I examined and evaluated the patient with Sherron Alesaylor Labron Bloodgood PA. The plan of care was discussed as noted above.  Pollyann SavoyShaili Deveshwar, MD  Note - This record has been created using [employer] software.  Chart creation errors have been sought, but may not always  have been located. Such creation errors do not reflect on  the standard of medical care.

## 2017-10-13 NOTE — Telephone Encounter (Signed)
Received a fax from [employer] regarding a prior authorization for Enbrel. Authorization has been APPROVED from 10/13/2017 to 04/15/2018.   Will send document to scan center.  Authorization # [id number] Phone # [phone]  4:27 PM Dorthula Nettlesachael N Manuel Lawhead, CPhT

## 2017-10-13 NOTE — Telephone Encounter (Signed)
Called [employer] to confirm clinical questions fax was received. Spoke to Rep named Mir, reviewed answer to clinical questions. He stated PA was approved for 6 months. Will update once fax confirmation is received.  VW#[id number] Phone- [phone]  11:53 AM. Dorthula Nettlesachael N Jori Thrall, CPhT

## 2017-10-13 NOTE — Patient Instructions (Signed)
Standing Labs We placed an order today for your standing lab work.    Please come back and get your standing labs in September and every 3 months   We have open lab Monday through Friday from 8:30-11:30 AM and 1:30-4:00 PM  at the office of Dr. Pollyann SavoyShaili Deveshwar.   You may experience shorter wait times on Monday and Friday afternoons. The office is located at [address], [address], [place], [place] [address] No appointment is necessary.   Labs are drawn by [facility].  You may receive a bill from [facility] for your lab work. If you have any questions regarding directions or hours of operation,  please call [phone].     Trochanteric Bursitis Trochanteric bursitis is a condition that causes hip pain. Trochanteric bursitis happens when fluid-filled sacs (bursae) in the hip get irritated. Normally these sacs absorb shock and help strong bands of tissue (tendons) in your hip glide smoothly over each other and over your hip bones. What are the causes? This condition results from increased friction between the hip bones and the tendons that go over them. This condition can happen if you:  Have weak hips.  Use your hip muscles too much (overuse).  Get hit in the hip.  What increases the risk? This condition is more likely to develop in:  Women.  Adults who are middle-aged or older.  People with arthritis or a spinal condition.  People with weak buttocks muscles (gluteal muscles).  People who have one leg that is shorter than the other.  People who participate in certain kinds of athletic activities, such as: ? Running sports, especially long-distance running. ? Contact sports, like football or martial arts. ? Sports in which falls may occur, like skiing.  What are the signs or symptoms? The main symptom of this condition is pain and tenderness over the point of your hip. The pain may be:  Sharp and intense.  Dull and achy.  Felt on the outside of your thigh.  It may  increase when you:  Lie on your side.  Walk or run.  Go up on stairs.  Sit.  Stand up after sitting.  Stand for long periods of time.  How is this diagnosed? This condition may be diagnosed based on:  Your symptoms.  Your medical history.  A physical exam.  Imaging tests, such as: ? X-rays to check your bones. ? An MRI or ultrasound to check your tendons and muscles.  During your physical exam, your health care provider will check the movement and strength of your hip. He or she may press on the point of your hip to check for pain. How is this treated? This condition may be treated by:  Resting.  Reducing your activity.  Avoiding activities that cause pain.  Using crutches, a cane, or a walker to decrease the strain on your hip.  Taking medicine to help with swelling.  Having medicine injected into the bursae to help with swelling.  Using ice, heat, and massage therapy for pain relief.  Physical therapy exercises for strength and flexibility.  Surgery (rare).  Follow these instructions at home: Activity  Rest.  Avoid activities that cause pain.  Return to your normal activities as told by your health care provider. Ask your health care provider what activities are safe for you. Managing pain, stiffness, and swelling  Take over-the-counter and prescription medicines only as told by your health care provider.  If directed, apply heat to the injured area as told by  your health care provider. ? Place a towel between your skin and the heat source. ? Leave the heat on for 20-30 minutes. ? Remove the heat if your skin turns bright red. This is especially important if you are unable to feel pain, heat, or cold. You may have a greater risk of getting burned.  If directed, apply ice to the injured area: ? Put ice in a plastic bag. ? Place a towel between your skin and the bag. ? Leave the ice on for 20 minutes, 2-3 times a day. General instructions  If  the affected leg is one that you use for driving, ask your health care provider when it is safe to drive.  Use crutches, a cane, or a walker as told by your health care provider.  If one of your legs is shorter than the other, get fitted for a shoe insert.  Lose weight if you are overweight. How is this prevented?  Wear supportive footwear that is appropriate for your sport.  If you have hip pain, start any new exercise or sport slowly.  Maintain physical fitness, including: ? Strength. ? Flexibility. Contact a health care provider if:  Your pain does not improve with 2-4 weeks. Get help right away if:  You develop severe pain.  You have a fever.  You develop increased redness over your hip.  You have a change in your bowel function or bladder function.  You cannot control the muscles in your feet. This information is not intended to replace advice given to you by your health care provider. Make sure you discuss any questions you have with your health care provider. Document Released: 04/08/2004 Document Revised: 11/05/2015 Document Reviewed: 02/14/2015 Elsevier Interactive Patient Education  2018 Else[employer].   Trochanteric Bursitis Rehab Ask your health care provider which exercises are safe for you. Do exercises exactly as told by your health care provider and adjust them as directed. It is normal to feel mild stretching, pulling, tightness, or discomfort as you do these exercises, but you should stop right away if you feel sudden pain or your pain gets worse.Do not begin these exercises until told by your health care provider. Stretching exercises These exercises warm up your muscles and joints and improve the movement and flexibility of your hip. These exercises also help to relieve pain and stiffness. Exercise A: Iliotibial band stretch  1. Lie on your side with your left / right leg in the top position. 2. Bend your left / right knee and grab your ankle. 3. Slowly  bring your knee back so your thigh is behind your body. 4. Slowly lower your knee toward the floor until you feel a gentle stretch on the outside of your left / right thigh. If you do not feel a stretch and your knee will not fall farther, place the heel of your other foot on top of your outer knee and pull your thigh down farther. 5. Hold this position for __________ seconds. 6. Slowly return to the starting position. Repeat __________ times. Complete this exercise __________ times a day. Strengthening exercises These exercises build strength and endurance in your hip and pelvis. Endurance is the ability to use your muscles for a long time, even after they get tired. Exercise B: Bridge ( hip extensors) 1. Lie on your back on a firm surface with your knees bent and your feet flat on the floor. 2. Tighten your buttocks muscles and lift your buttocks off the floor until your trunk is  level with your thighs. You should feel the muscles working in your buttocks and the back of your thighs. If this exercise is too easy, try doing it with your arms crossed over your chest. 3. Hold this position for __________ seconds. 4. Slowly return to the starting position. 5. Let your muscles relax completely between repetitions. Repeat __________ times. Complete this exercise __________ times a day. Exercise C: Squats ( knee extensors and  quadriceps) 1. Stand in front of a table, with your feet and knees pointing straight ahead. You may rest your hands on the table for balance but not for support. 2. Slowly bend your knees and lower your hips like you are going to sit in a chair. ? Keep your weight over your heels, not over your toes. ? Keep your lower legs upright so they are parallel with the table legs. ? Do not let your hips go lower than your knees. ? Do not bend lower than told by your health care provider. ? If your hip pain increases, do not bend as low. 3. Hold this position for __________  seconds. 4. Slowly push with your legs to return to standing. Do not use your hands to pull yourself to standing. Repeat __________ times. Complete this exercise __________ times a day. Exercise D: Hip hike 1. Stand sideways on a bottom step. Stand on your left / right leg with your other foot unsupported next to the step. You can hold onto the railing or wall if needed for balance. 2. Keeping your knees straight and your torso square, lift your left / right hip up toward the ceiling. 3. Hold this position for __________ seconds. 4. Slowly let your left / right hip lower toward the floor, past the starting position. Your foot should get closer to the floor. Do not lean or bend your knees. Repeat __________ times. Complete this exercise __________ times a day. Exercise E: Single leg stand 1. Stand near a counter or door frame that you can hold onto for balance as needed. It is helpful to stand in front of a mirror for this exercise so you can watch your hip. 2. Squeeze your left / right buttock muscles then lift up your other foot. Do not let your left / right hip push out to the side. 3. Hold this position for __________ seconds. Repeat __________ times. Complete this exercise __________ times a day. This information is not intended to replace advice given to you by your health care provider. Make sure you discuss any questions you have with your health care provider. Document Released: 04/08/2004 Document Revised: 11/06/2015 Document Reviewed: 02/14/2015 Elsevier Interactive Patient Education  [employer].

## 2017-10-19 ENCOUNTER — Other Ambulatory Visit: Payer: Self-pay | Admitting: *Deleted

## 2017-10-19 MED ORDER — METHOTREXATE (PF) 20 MG/0.4ML ~~LOC~~ SOAJ: SUBCUTANEOUS | 0 refills | Status: DC

## 2017-10-19 NOTE — Telephone Encounter (Signed)
Refill request received via fax  Last Visit: 10/13/17 Next Visit: 03/16/18  Labs: 09/02/17 WNL  Okay to refill per Dr. Corliss Skainseveshwar

## 2017-12-02 ENCOUNTER — Other Ambulatory Visit: Payer: Self-pay | Admitting: Physician Assistant

## 2017-12-02 DIAGNOSIS — Z79899 Other long term (current) drug therapy: Secondary | ICD-10-CM

## 2017-12-02 LAB — CBC WITH DIFFERENTIAL/PLATELET
Basophils Absolute: 57 cells/uL (ref 0–200)
Basophils Relative: 0.7 %
EOS ABS: 123 {cells}/uL (ref 15–500)
Eosinophils Relative: 1.5 %
HCT: 37 % (ref 35.0–45.0)
Hemoglobin: 12.7 g/dL (ref 11.7–15.5)
Lymphs Abs: 2239 cells/uL (ref 850–3900)
MCH: 30.4 pg (ref 27.0–33.0)
MCHC: 34.3 g/dL (ref 32.0–36.0)
MCV: 88.5 fL (ref 80.0–100.0)
MONOS PCT: 9.6 %
MPV: 10.8 fL (ref 7.5–12.5)
NEUTROS PCT: 60.9 %
Neutro Abs: 4994 cells/uL (ref 1500–7800)
PLATELETS: 337 10*3/uL (ref 140–400)
RBC: 4.18 10*6/uL (ref 3.80–5.10)
RDW: 13.6 % (ref 11.0–15.0)
Total Lymphocyte: 27.3 %
WBC: 8.2 10*3/uL (ref 3.8–10.8)
WBCMIX: 787 {cells}/uL (ref 200–950)

## 2017-12-02 LAB — COMPLETE METABOLIC PANEL WITH GFR
AG RATIO: 2.2 (calc) (ref 1.0–2.5)
ALBUMIN MSPROF: 4.6 g/dL (ref 3.6–5.1)
ALKALINE PHOSPHATASE (APISO): 85 U/L (ref 33–130)
ALT: 20 U/L (ref 6–29)
AST: 15 U/L (ref 10–35)
BUN: 15 mg/dL (ref 7–25)
CO2: 27 mmol/L (ref 20–32)
CREATININE: 0.77 mg/dL (ref 0.50–0.99)
Calcium: 9.5 mg/dL (ref 8.6–10.4)
Chloride: 104 mmol/L (ref 98–110)
GFR, EST NON AFRICAN AMERICAN: 83 mL/min/{1.73_m2} (ref >=60)
GFR, Est African American: 97 mL/min/{1.73_m2} (ref >=60)
GLOBULIN: 2.1 g/dL (ref 1.9–3.7)
Glucose, Bld: 94 mg/dL (ref 65–99)
POTASSIUM: 4.1 mmol/L (ref 3.5–5.3)
SODIUM: 140 mmol/L (ref 135–146)
Total Bilirubin: 0.3 mg/dL (ref 0.2–1.2)
Total Protein: 6.7 g/dL (ref 6.1–8.1)

## 2017-12-07 ENCOUNTER — Ambulatory Visit: Payer: 59 | Admitting: Physician Assistant

## 2017-12-14 ENCOUNTER — Other Ambulatory Visit: Payer: Self-pay | Admitting: *Deleted

## 2017-12-14 MED ORDER — ETANERCEPT 50 MG/ML ~~LOC~~ SOCT: 50.0000 mg | SUBCUTANEOUS | 0 refills | Status: DC

## 2017-12-14 NOTE — Telephone Encounter (Signed)
Refill request received via fax  Last Visit: 10/13/17 Next Visit: 03/16/18 Labs: 12/02/17 WNL  TB Gold: 03/28/17 Neg   Okay to refill per Dr. Deveshwar 

## 2018-01-04 ENCOUNTER — Telehealth: Payer: Self-pay | Admitting: Pharmacy Technician

## 2018-01-04 NOTE — Telephone Encounter (Signed)
Received a Prior Authorization request from [employer] for [employer]. Authorization has been submitted to patient's insurance via Cover My Meds. Will update once we receive a response.  3:36 PM Dorthula Nettles, CPhT

## 2018-01-05 NOTE — Telephone Encounter (Signed)
Received a fax from [place] regarding a prior authorization for Rasuvo. Authorization has been APPROVED from 01/04/18 to 01/05/19.   Will send document to scan center.  Authorization # [id number]  4:12 PM Dorthula Nettles, CPhT

## 2018-01-11 ENCOUNTER — Other Ambulatory Visit: Payer: Self-pay | Admitting: Rheumatology

## 2018-01-12 NOTE — Telephone Encounter (Signed)
Last Visit: 10/13/17 Next Visit: 03/16/18   Okay to refill per Dr. Corliss Skains

## 2018-01-19 ENCOUNTER — Other Ambulatory Visit: Payer: Self-pay | Admitting: *Deleted

## 2018-01-19 MED ORDER — METHOTREXATE (PF) 20 MG/0.4ML ~~LOC~~ SOAJ: SUBCUTANEOUS | 0 refills | Status: DC

## 2018-01-19 NOTE — Telephone Encounter (Signed)
Refill request received via fax  Last Visit: 10/13/17 Next Visit: 03/16/18 Labs: 12/02/17 WNL   Okay to refill per Dr. Corliss Skains

## 2018-03-02 NOTE — Progress Notes (Deleted)
Office Visit Note  Patient: Emma OremCynthia C Brownstein             Date of Birth: 12-13-1955           MRN: [id number]             PCP: Lizbeth BarkSmothers, Deborah N, NP Referring: Smothers, Cathleen Cortieborah N, NP Visit Date: 03/16/2018 Occupation: @GUAROCC @  Subjective:  No chief complaint on file.  Enbrel and methotrexate. Last TB gold negative on 03/28/17.  Most recent CBC/CMP within normal limits on 12/02/17 and monitor every 3 months.  Standing orders are in place.  CBC/CMP/TB gold ordered for today. Recommend flu, Pneumovax 23, Prevnar 13, and Shingrix as indicated.  History of Present Illness: Emma Warner is a 62 y.o. female ***   Activities of Daily Living:  Patient reports morning stiffness for *** {minute/hour:19697}.   Patient {ACTIONS;DENIES/REPORTS:21021675::"Denies"} nocturnal pain.  Difficulty dressing/grooming: {ACTIONS;DENIES/REPORTS:21021675::"Denies"} Difficulty climbing stairs: {ACTIONS;DENIES/REPORTS:21021675::"Denies"} Difficulty getting out of chair: {ACTIONS;DENIES/REPORTS:21021675::"Denies"} Difficulty using hands for taps, buttons, cutlery, and/or writing: {ACTIONS;DENIES/REPORTS:21021675::"Denies"}  No Rheumatology ROS completed.   PMFS History:  Patient Active Problem List   Diagnosis Date Noted  . Primary osteoarthritis of both hands 07/16/2016  . Rheumatoid arthritis involving multiple sites with positive rheumatoid factor (HCC) 07/15/2016  . ANA positive 07/15/2016  . History of gastroesophageal reflux (GERD) 07/15/2016  . Ds DNA antibody positive/ [facility] labs show patient does not have lupus 10/2016 07/15/2016  . Other fatigue 07/15/2016  . High risk medication use 07/15/2016  . Primary insomnia 07/15/2016  . Hypertension   . Hyperlipidemia   . GERD (gastroesophageal reflux disease)   . Prediabetes   . Vitamin D deficiency   . ALLERGIC RHINITIS 12/25/2007  . PLEURAL EFFUSION 12/25/2007    Past Medical History:  Diagnosis Date  . Arthritis   . GERD  (gastroesophageal reflux disease)   . Hyperlipidemia   . Hypertension   . Prediabetes   . Vitamin D deficiency     Family History  Problem Relation Age of Onset  . Diabetes Mother   . Rheum arthritis Mother   . Cancer Father        colon  . Cirrhosis Brother        alcoholic  . Alcoholism Brother    Past Surgical History:  Procedure Laterality Date  . APPENDECTOMY     Social History   Social History Narrative  . Not on file   Immunization History  Administered Date(s) Administered  . Influenza,inj,Quad PF,6+ Mos 02/13/2016, 01/04/2017  . Td 03/15/2005  . Zoster 05/04/2016   Objective: Vital Signs: There were no vitals taken for this visit.   Physical Exam   Musculoskeletal Exam: ***  CDAI Exam: CDAI Score: Not documented Patient Global Assessment: Not documented; Provider Global Assessment: Not documented Swollen: Not documented; Tender: Not documented Joint Exam   Not documented   There is currently no information documented on the homunculus. Go to the Rheumatology activity and complete the homunculus joint exam.  Investigation: No additional findings.  Imaging: No results found.  Recent Labs: Lab Results  Component Value Date   WBC 8.2 12/02/2017   HGB 12.7 12/02/2017   PLT 337 12/02/2017   NA 140 12/02/2017   K 4.1 12/02/2017   CL 104 12/02/2017   CO2 27 12/02/2017   GLUCOSE 94 12/02/2017   BUN 15 12/02/2017   CREATININE 0.77 12/02/2017   BILITOT 0.3 12/02/2017   ALKPHOS 74 10/14/2016   AST 15 12/02/2017   ALT 20 12/02/2017  PROT 6.7 12/02/2017   ALBUMIN 4.4 10/14/2016   CALCIUM 9.5 12/02/2017   GFRAA 97 12/02/2017   QFTBGOLDPLUS NEGATIVE 03/28/2017    Speciality Comments: No specialty comments available.  Procedures:  No procedures performed Allergies: Patient has no known allergies.   Assessment / Plan:     Visit Diagnoses: No diagnosis found.   Orders: No orders of the defined types were placed in this encounter.  No  orders of the defined types were placed in this encounter.   Face-to-face time spent with patient was *** minutes. Greater than 50% of time was spent in counseling and coordination of care.  Follow-Up Instructions: No follow-ups on file.   Ellen HenriMarissa C Sharol Croghan, CMA  Note - This record has been created using Animal nutritionistDragon software.  Chart creation errors have been sought, but may not always  have been located. Such creation errors do not reflect on  the standard of medical care.

## 2018-03-03 ENCOUNTER — Other Ambulatory Visit: Payer: Self-pay | Admitting: *Deleted

## 2018-03-03 MED ORDER — ETANERCEPT 50 MG/ML ~~LOC~~ SOCT: 50.0000 mg | SUBCUTANEOUS | 0 refills | Status: DC

## 2018-03-03 NOTE — Telephone Encounter (Signed)
Refill request received via fax  Last Visit: 10/13/17 Next Visit: 03/16/18 Labs: 12/02/17 WNL  TB Gold: 03/28/17 Neg   Okay to refill per Dr. Corliss Skainseveshwar

## 2018-03-16 ENCOUNTER — Ambulatory Visit: Payer: 59 | Admitting: Physician Assistant

## 2018-03-17 NOTE — Progress Notes (Signed)
Enbrel and methotrexate.  Last TB gold negative on 03/28/2017.  Order for TB gold for today.  Most recent CBC/CMP within normal limits on 12/02/2017.  Due for CBC/CMP today today and will monitor every 3 months.  Standing orders are in place. Recommend annual influenza, Pneumovax 23, Prevnar 13, and Shingrix as indicated.  Recommend yearly skin exam.

## 2018-03-17 NOTE — Progress Notes (Signed)
Office Visit Note  Patient: Emma Warner             Date of Birth: [birth year]           MRN: [id number]             PCP: Lizbeth BarkSmothers, Deborah N, NP Referring: Glenetta BorgSmothers, Cathleen Cortieborah N, NP Visit Date: 03/21/2018 Occupation: @GUAROCC @  Subjective:  Fatigue   History of Present Illness: Emma Warner is a 63 y.o. female with history of seropositive rheumatoid arthritis and osteoarthritis.  She is on Enbrel 50 mg sq injections once weekly and Rasuvo 20 mg sq once weekly and folic acid 2 mg po daily. She has not missed any doses recently.  She is tolerating her medications well. She denies any recent flares.  She denies any joint pain or joint swelling at this time.  She has occasional left wrist pain and right knee pain.  She has reports since Saturday she has increased joint aching and fatigue but no joint swelling or fevers. She has not had any recent infections.  She had the influenza vaccination.   Activities of Daily Living:  Patient reports morning stiffness for 1 hour.   Patient Denies nocturnal pain.  Difficulty dressing/grooming: Denies Difficulty climbing stairs: Denies Difficulty getting out of chair: Denies Difficulty using hands for taps, buttons, cutlery, and/or writing: Denies  Review of Systems  Constitutional: Positive for fatigue.  HENT: Negative for mouth sores, mouth dryness and nose dryness.   Eyes: Negative for pain, visual disturbance and dryness.  Respiratory: Negative for cough, hemoptysis, shortness of breath and difficulty breathing.   Cardiovascular: Negative for chest pain, palpitations, hypertension and swelling in legs/feet.  Gastrointestinal: Negative for blood in stool, constipation and diarrhea.  Endocrine: Negative for increased urination.  Genitourinary: Negative for painful urination.  Musculoskeletal: Positive for arthralgias, joint pain and morning stiffness. Negative for joint swelling, myalgias, muscle weakness, muscle tenderness and myalgias.    Skin: Positive for hair loss. Negative for color change, pallor, rash, nodules/bumps, skin tightness, ulcers and sensitivity to sunlight.  Allergic/Immunologic: Negative for susceptible to infections.  Neurological: Negative for dizziness, numbness, headaches and weakness.  Hematological: Negative for swollen glands.  Psychiatric/Behavioral: Negative for depressed mood and sleep disturbance. The patient is not nervous/anxious.     PMFS History:  Patient Active Problem List   Diagnosis Date Noted  . Primary osteoarthritis of both hands 07/16/2016  . Rheumatoid arthritis involving multiple sites with positive rheumatoid factor (HCC) 07/15/2016  . ANA positive 07/15/2016  . History of gastroesophageal reflux (GERD) 07/15/2016  . Ds DNA antibody positive/ [facility] labs show patient does not have lupus 10/2016 07/15/2016  . Other fatigue 07/15/2016  . High risk medication use 07/15/2016  . Primary insomnia 07/15/2016  . Hypertension   . Hyperlipidemia   . GERD (gastroesophageal reflux disease)   . Prediabetes   . Vitamin D deficiency   . ALLERGIC RHINITIS 12/25/2007  . PLEURAL EFFUSION 12/25/2007    Past Medical History:  Diagnosis Date  . Arthritis   . GERD (gastroesophageal reflux disease)   . Hyperlipidemia   . Hypertension   . Prediabetes   . Vitamin D deficiency     Family History  Problem Relation Age of Onset  . Diabetes Mother   . Rheum arthritis Mother   . Cancer Father        colon  . Cirrhosis Brother        alcoholic  . Alcoholism Brother    Past Surgical  History:  Procedure Laterality Date  . APPENDECTOMY     Social History   Social History Narrative  . Not on file    Objective: Vital Signs: BP 133/81 (BP Location: Left Arm, Patient Position: Sitting, Cuff Size: Small)   Pulse 94   Resp 13   Ht 5\' 8"  (1.727 m)   Wt 177 lb 9.6 oz (80.6 kg)   BMI 27.00 kg/m    Physical Exam Vitals signs and nursing note reviewed.  Constitutional:      Appearance:  She is well-developed.  HENT:     Head: Normocephalic and atraumatic.  Eyes:     Conjunctiva/sclera: Conjunctivae normal.  Neck:     Musculoskeletal: Normal range of motion.  Cardiovascular:     Rate and Rhythm: Normal rate and regular rhythm.     Heart sounds: Normal heart sounds.  Pulmonary:     Effort: Pulmonary effort is normal.     Breath sounds: Normal breath sounds.  Abdominal:     General: Bowel sounds are normal.     Palpations: Abdomen is soft.  Lymphadenopathy:     Cervical: No cervical adenopathy.  Skin:    General: Skin is warm and dry.     Capillary Refill: Capillary refill takes less than 2 seconds.  Neurological:     Mental Status: She is alert and oriented to person, place, and time.  Psychiatric:        Behavior: Behavior normal.      Musculoskeletal Exam: C-spine, thoracic spine, and lumbar spine good ROM.  No midline spinal tenderness.  No SI joint tenderness.  Shoulder joints, elbow joints, wrist joints, MCPs, PIPs, and DIPs good ROM with no synovitis.  Complete fist formation bilaterally.  Hip joints, knee joints, ankle joints, MTPs, PIPs, and DIPs good ROM with no synovitis.  No warmth or effusion of knee joints.  No tenderness or swelling of ankle joints.  No tenderness over trochanteric bursa bilaterally.    CDAI Exam: CDAI Score: 1  Patient Global Assessment: 5 (mm); Provider Global Assessment: 5 (mm) Swollen: 0 ; Tender: 0  Joint Exam   Not documented   There is currently no information documented on the homunculus. Go to the Rheumatology activity and complete the homunculus joint exam.  Investigation: No additional findings.  Imaging: No results found.  Recent Labs: Lab Results  Component Value Date   WBC 8.2 12/02/2017   HGB 12.7 12/02/2017   PLT 337 12/02/2017   NA 140 12/02/2017   K 4.1 12/02/2017   CL 104 12/02/2017   CO2 27 12/02/2017   GLUCOSE 94 12/02/2017   BUN 15 12/02/2017   CREATININE 0.77 12/02/2017   BILITOT 0.3  12/02/2017   ALKPHOS 74 10/14/2016   AST 15 12/02/2017   ALT 20 12/02/2017   PROT 6.7 12/02/2017   ALBUMIN 4.4 10/14/2016   CALCIUM 9.5 12/02/2017   GFRAA 97 12/02/2017   QFTBGOLDPLUS NEGATIVE 03/28/2017    Speciality Comments: No specialty comments available.  Procedures:  No procedures performed Allergies: Patient has no known allergies.   Assessment / Plan:     Visit Diagnoses: Rheumatoid arthritis involving multiple sites with positive rheumatoid factor (HCC): She has no synovitis on exam.  She is clinically doing well on combination of Enbrel 50 mg sq injections once weekly and Rasuvo 20 mg sq once weekly.  She has not missed any doses recently.  She is tolerating these medications well.  She does not need any refills.  She has not  had any recent rheumatoid arthritis flares.  She has no joint pain or joint swelling at this time.  She has occasional left wrist pain and right knee pain.  She has no warmth or effusion of knee joints.  She will continue on this current treatment regimen.  She was advised to notify us if she develops increased joint pain or joint swelling.  She will follow up in 5 months.   High risk medication use -  Started Enbrel on 05/03/17. She injects Rasuvo 20 mg/0.4 ml weekly and takes folic acid 2 mg daily. TB gold negative 03/28/17. CBC, CMP, and TB gold will be drawn today. - Plan: CBC with Differential/Platelet, COMPLETE METABOLIC PANEL WITH GFR, QuantiFERON-TB Gold Plus  Primary osteoarthritis of both hands: She has no synovitis on exam.  She has complete fist formation.  Joint protection and muscle strengthening were discussed.   Primary osteoarthritis of both feet: She has no discomfort in her feet at this time.  She wears proper fitting shoes.   Other fatigue: She has been having worsening fatigue since Saturday.  She has not had any fevers or recent infections.   Primary insomnia  Other medical conditions are listed as follows:   History of  gastroesophageal reflux (GERD)  Prediabetes  History of vitamin D deficiency  History of hypertension  History of hyperlipidemia  History of pleural effusion /2009  Trochanteric bursitis, right hip: She has no tenderness on exam.   Orders: Orders Placed This Encounter  Procedures  . CBC with Differential/Platelet  . COMPLETE METABOLIC PANEL WITH GFR  . QuantiFERON-TB Gold Plus   No orders of the defined types were placed in this encounter.     Follow-Up Instructions: Return for Rheumatoid arthritis, Osteoarthritis.   Gearldine Bienenstock, PA-C   I examined and evaluated the patient with Sherron Ales PA.  She had no synovitis on examination.  We will continue current treatment.  The plan of care was discussed as noted above.  Pollyann Savoy, MD  Note - This record has been created using Animal nutritionist.  Chart creation errors have been sought, but may not always  have been located. Such creation errors do not reflect on  the standard of medical care.

## 2018-03-21 ENCOUNTER — Encounter: Payer: Self-pay | Admitting: Rheumatology

## 2018-03-21 ENCOUNTER — Ambulatory Visit: Payer: 59 | Admitting: Rheumatology

## 2018-03-21 VITALS — BP 133/81 | HR 94 | Resp 13 | Ht 68.0 in | Wt 177.6 lb

## 2018-03-21 DIAGNOSIS — Z8679 Personal history of other diseases of the circulatory system: Secondary | ICD-10-CM | POA: Diagnosis not present

## 2018-03-21 DIAGNOSIS — R7303 Prediabetes: Secondary | ICD-10-CM | POA: Diagnosis not present

## 2018-03-21 DIAGNOSIS — R5383 Other fatigue: Secondary | ICD-10-CM | POA: Diagnosis not present

## 2018-03-21 DIAGNOSIS — M7061 Trochanteric bursitis, right hip: Secondary | ICD-10-CM

## 2018-03-21 DIAGNOSIS — Z8719 Personal history of other diseases of the digestive system: Secondary | ICD-10-CM

## 2018-03-21 DIAGNOSIS — M19072 Primary osteoarthritis, left ankle and foot: Secondary | ICD-10-CM

## 2018-03-21 DIAGNOSIS — M19041 Primary osteoarthritis, right hand: Secondary | ICD-10-CM | POA: Diagnosis not present

## 2018-03-21 DIAGNOSIS — Z79899 Other long term (current) drug therapy: Secondary | ICD-10-CM

## 2018-03-21 DIAGNOSIS — Z8639 Personal history of other endocrine, nutritional and metabolic disease: Secondary | ICD-10-CM

## 2018-03-21 DIAGNOSIS — Z8709 Personal history of other diseases of the respiratory system: Secondary | ICD-10-CM

## 2018-03-21 DIAGNOSIS — F5101 Primary insomnia: Secondary | ICD-10-CM

## 2018-03-21 DIAGNOSIS — M0579 Rheumatoid arthritis with rheumatoid factor of multiple sites without organ or systems involvement: Secondary | ICD-10-CM

## 2018-03-21 DIAGNOSIS — R69 Illness, unspecified: Secondary | ICD-10-CM | POA: Diagnosis not present

## 2018-03-21 DIAGNOSIS — M19042 Primary osteoarthritis, left hand: Secondary | ICD-10-CM

## 2018-03-21 DIAGNOSIS — M19071 Primary osteoarthritis, right ankle and foot: Secondary | ICD-10-CM

## 2018-03-22 NOTE — Progress Notes (Signed)
CBC and CMP are WNL

## 2018-03-23 LAB — CBC WITH DIFFERENTIAL/PLATELET
Absolute Monocytes: 600 cells/uL (ref 200–950)
Basophils Absolute: 40 cells/uL (ref 0–200)
Basophils Relative: 0.5 %
EOS PCT: 1.5 %
Eosinophils Absolute: 120 cells/uL (ref 15–500)
HCT: 39 % (ref 35.0–45.0)
HEMOGLOBIN: 13.2 g/dL (ref 11.7–15.5)
Lymphs Abs: 2104 cells/uL (ref 850–3900)
MCH: 29.7 pg (ref 27.0–33.0)
MCHC: 33.8 g/dL (ref 32.0–36.0)
MCV: 87.6 fL (ref 80.0–100.0)
MONOS PCT: 7.5 %
MPV: 10.7 fL (ref 7.5–12.5)
Neutro Abs: 5136 cells/uL (ref 1500–7800)
Neutrophils Relative %: 64.2 %
PLATELETS: 364 10*3/uL (ref 140–400)
RBC: 4.45 10*6/uL (ref 3.80–5.10)
RDW: 13.7 % (ref 11.0–15.0)
TOTAL LYMPHOCYTE: 26.3 %
WBC: 8 10*3/uL (ref 3.8–10.8)

## 2018-03-23 LAB — COMPLETE METABOLIC PANEL WITH GFR
AG RATIO: 1.9 (calc) (ref 1.0–2.5)
ALKALINE PHOSPHATASE (APISO): 77 U/L (ref 33–130)
ALT: 23 U/L (ref 6–29)
AST: 17 U/L (ref 10–35)
Albumin: 4.6 g/dL (ref 3.6–5.1)
BILIRUBIN TOTAL: 0.4 mg/dL (ref 0.2–1.2)
BUN: 15 mg/dL (ref 7–25)
CHLORIDE: 104 mmol/L (ref 98–110)
CO2: 25 mmol/L (ref 20–32)
Calcium: 9.3 mg/dL (ref 8.6–10.4)
Creat: 0.75 mg/dL (ref 0.50–0.99)
GFR, EST AFRICAN AMERICAN: 99 mL/min/{1.73_m2} (ref >=60)
GFR, Est Non African American: 85 mL/min/{1.73_m2} (ref >=60)
Globulin: 2.4 g/dL (calc) (ref 1.9–3.7)
Glucose, Bld: 98 mg/dL (ref 65–99)
Potassium: 4.2 mmol/L (ref 3.5–5.3)
Sodium: 141 mmol/L (ref 135–146)
TOTAL PROTEIN: 7 g/dL (ref 6.1–8.1)

## 2018-03-23 LAB — QUANTIFERON-TB GOLD PLUS
Mitogen-NIL: 4.5 IU/mL
NIL: 0.02 IU/mL
QuantiFERON-TB Gold Plus: NEGATIVE
TB1-NIL: <0 IU/mL
TB2-NIL: <0 IU/mL

## 2018-03-23 NOTE — Progress Notes (Signed)
TB gold negative

## 2018-04-03 ENCOUNTER — Other Ambulatory Visit: Payer: Self-pay | Admitting: *Deleted

## 2018-04-03 MED ORDER — METHOTREXATE (PF) 20 MG/0.4ML ~~LOC~~ SOAJ: SUBCUTANEOUS | 0 refills | Status: DC

## 2018-04-03 NOTE — Telephone Encounter (Signed)
Refill request received via fax  Last Visit: 03/21/18 Next Visit: 08/29/18 Labs: 03/21/18 WNL  Okay to refill per Dr. Corliss Skains

## 2018-04-25 NOTE — Telephone Encounter (Signed)
[facility] for ENBREL Mini. Authorization has been APPROVED from 04/25/2018 to 04/26/2019.   Will send document to scan center.  Authorization # [id number]

## 2018-04-25 NOTE — Telephone Encounter (Signed)
Received a Prior Authorization request from [employer] for ENBREL. Authorization has been submitted to patient's insurance via Cover My Meds. Will update once we receive a response.

## 2018-04-27 ENCOUNTER — Other Ambulatory Visit: Payer: Self-pay | Admitting: Rheumatology

## 2018-04-27 NOTE — Telephone Encounter (Signed)
Last Visit: 03/21/18 Next Visit: 08/29/18  Okay to refill per Dr. Corliss Skains

## 2018-06-21 ENCOUNTER — Other Ambulatory Visit: Payer: Self-pay | Admitting: *Deleted

## 2018-06-21 MED ORDER — ETANERCEPT 50 MG/ML ~~LOC~~ SOCT: 50.0000 mg | SUBCUTANEOUS | 0 refills | Status: DC

## 2018-06-21 NOTE — Telephone Encounter (Signed)
Refill request received via fax  Last Visit: 03/21/18 Next Visit: 08/29/18 Labs: 03/21/18 WNL TB Gold: 03/21/18 Neg  Patient advised she is due to update next month as she is staying at home and not leaving.   Okay to refill per Dr. Corliss Skains

## 2018-06-26 ENCOUNTER — Telehealth: Payer: Self-pay

## 2018-06-26 MED ORDER — METHOTREXATE (PF) 20 MG/0.4ML ~~LOC~~ SOAJ: SUBCUTANEOUS | 0 refills | Status: DC

## 2018-06-26 NOTE — Telephone Encounter (Signed)
Received refill request via fax from [facility].   Last Visit: 03/21/2018 Next Visit: 08/29/2018 Labs: 03/21/2018 WNL   Attempted to contact patient and left message on machine to advise patient she is due to update labs.   Okay to refill per Dr. Corliss Skains.

## 2018-08-15 NOTE — Progress Notes (Deleted)
Office Visit Note  Patient: Emma Warner             Date of Birth: 09-03-1955           MRN: [id number]             PCP: Lizbeth Bark, NP Referring: Smothers, Cathleen Corti, NP Visit Date: 08/29/2018 Occupation: @GUAROCC @  Subjective:  No chief complaint on file.   Enbrel mini 50 mg every 7 days, Rasuvo 20 mg every 7 days, and folic acid 1 mg 2 tablets daily.  Most recent TB gold negative on 03/21/2018 and will monitor yearly.  Most recent CBC/CMP within normal limits on 03/21/2018.  She is overdue for labs.  CBC/CMP ordered for today and will monitor every 3 months.  Standing orders are in place.  She received the flu vaccine in October and previously Zostavax.  Recommend Prevnar 13, Pneumovax 23, and Shingrix vaccines as indicated.  Recommend yearly skin exams.  History of Present Illness: Emma Warner is a 63 y.o. female ***   Activities of Daily Living:  Patient reports morning stiffness for *** {minute/hour:19697}.   Patient {ACTIONS;DENIES/REPORTS:21021675::"Denies"} nocturnal pain.  Difficulty dressing/grooming: {ACTIONS;DENIES/REPORTS:21021675::"Denies"} Difficulty climbing stairs: {ACTIONS;DENIES/REPORTS:21021675::"Denies"} Difficulty getting out of chair: {ACTIONS;DENIES/REPORTS:21021675::"Denies"} Difficulty using hands for taps, buttons, cutlery, and/or writing: {ACTIONS;DENIES/REPORTS:21021675::"Denies"}  No Rheumatology ROS completed.   PMFS History:  Patient Active Problem List   Diagnosis Date Noted  . Primary osteoarthritis of both hands 07/16/2016  . Rheumatoid arthritis involving multiple sites with positive rheumatoid factor (HCC) 07/15/2016  . ANA positive 07/15/2016  . History of gastroesophageal reflux (GERD) 07/15/2016  . Ds DNA antibody positive/ [facility] labs show patient does not have lupus 10/2016 07/15/2016  . Other fatigue 07/15/2016  . High risk medication use 07/15/2016  . Primary insomnia 07/15/2016  . Hypertension   . Hyperlipidemia   .  GERD (gastroesophageal reflux disease)   . Prediabetes   . Vitamin D deficiency   . ALLERGIC RHINITIS 12/25/2007  . PLEURAL EFFUSION 12/25/2007    Past Medical History:  Diagnosis Date  . Arthritis   . GERD (gastroesophageal reflux disease)   . Hyperlipidemia   . Hypertension   . Prediabetes   . Vitamin D deficiency     Family History  Problem Relation Age of Onset  . Diabetes Mother   . Rheum arthritis Mother   . Cancer Father        colon  . Cirrhosis Brother        alcoholic  . Alcoholism Brother    Past Surgical History:  Procedure Laterality Date  . APPENDECTOMY     Social History   Social History Narrative  . Not on file   Immunization History  Administered Date(s) Administered  . Influenza,inj,Quad PF,6+ Mos 02/13/2016, 01/04/2017  . Td 03/15/2005  . Zoster 05/04/2016     Objective: Vital Signs: There were no vitals taken for this visit.   Physical Exam   Musculoskeletal Exam: ***  CDAI Exam: CDAI Score: Not documented Patient Global Assessment: Not documented; Provider Global Assessment: Not documented Swollen: Not documented; Tender: Not documented Joint Exam   Not documented   There is currently no information documented on the homunculus. Go to the Rheumatology activity and complete the homunculus joint exam.  Investigation: No additional findings.  Imaging: No results found.  Recent Labs: Lab Results  Component Value Date   WBC 8.0 03/21/2018   HGB 13.2 03/21/2018   PLT 364 03/21/2018   NA 141 03/21/2018  K 4.2 03/21/2018   CL 104 03/21/2018   CO2 25 03/21/2018   GLUCOSE 98 03/21/2018   BUN 15 03/21/2018   CREATININE 0.75 03/21/2018   BILITOT 0.4 03/21/2018   ALKPHOS 74 10/14/2016   AST 17 03/21/2018   ALT 23 03/21/2018   PROT 7.0 03/21/2018   ALBUMIN 4.4 10/14/2016   CALCIUM 9.3 03/21/2018   GFRAA 99 03/21/2018   QFTBGOLDPLUS NEGATIVE 03/21/2018    Speciality Comments: No specialty comments available.  Procedures:   No procedures performed Allergies: Patient has no known allergies.   Assessment / Plan:     Visit Diagnoses: No diagnosis found.   Orders: No orders of the defined types were placed in this encounter.  No orders of the defined types were placed in this encounter.   Face-to-face time spent with patient was *** minutes. Greater than 50% of time was spent in counseling and coordination of care.  Follow-Up Instructions: No follow-ups on file.   Ellen HenriMarissa C Shiasia Porro, CMA  Note - This record has been created using Animal nutritionistDragon software.  Chart creation errors have been sought, but may not always  have been located. Such creation errors do not reflect on  the standard of medical care.

## 2018-08-17 ENCOUNTER — Other Ambulatory Visit: Payer: Self-pay | Admitting: *Deleted

## 2018-08-17 MED ORDER — ETANERCEPT 50 MG/ML ~~LOC~~ SOCT: 50.0000 mg | SUBCUTANEOUS | 0 refills | Status: DC

## 2018-08-17 NOTE — Telephone Encounter (Signed)
Refill request received via fax  Last Visit:03/21/18 Next Visit:08/29/18 Labs:03/21/18 WNL TB Gold: 03/21/18 Neg  Left message to advise patient she is due to update labs.  Okay to refill 30 day supply Enbrel Mini?

## 2018-08-17 NOTE — Telephone Encounter (Signed)
ok 

## 2018-08-29 ENCOUNTER — Ambulatory Visit: Payer: Self-pay | Admitting: Rheumatology

## 2018-08-29 NOTE — Progress Notes (Deleted)
Office Visit Note  Patient: Emma Warner             Date of Birth: 12-Sep-1955           MRN: [id number]             PCP: Junie Panning, NP Referring: Smothers, Andree Elk, NP Visit Date: 08/30/2018 Occupation: @GUAROCC @  Subjective:  No chief complaint on file.   History of Present Illness: Emma Warner is a 63 y.o. female ***   Activities of Daily Living:  Patient reports morning stiffness for *** {minute/hour:19697}.   Patient {ACTIONS;DENIES/REPORTS:21021675::"Denies"} nocturnal pain.  Difficulty dressing/grooming: {ACTIONS;DENIES/REPORTS:21021675::"Denies"} Difficulty climbing stairs: {ACTIONS;DENIES/REPORTS:21021675::"Denies"} Difficulty getting out of chair: {ACTIONS;DENIES/REPORTS:21021675::"Denies"} Difficulty using hands for taps, buttons, cutlery, and/or writing: {ACTIONS;DENIES/REPORTS:21021675::"Denies"}  No Rheumatology ROS completed.   PMFS History:  Patient Active Problem List   Diagnosis Date Noted  . Primary osteoarthritis of both hands 07/16/2016  . Rheumatoid arthritis involving multiple sites with positive rheumatoid factor ([place]) 07/15/2016  . ANA positive 07/15/2016  . History of gastroesophageal reflux (GERD) 07/15/2016  . Ds DNA antibody positive/ [facility] labs show patient does not have lupus 10/2016 07/15/2016  . Other fatigue 07/15/2016  . High risk medication use 07/15/2016  . Primary insomnia 07/15/2016  . Hypertension   . Hyperlipidemia   . GERD (gastroesophageal reflux disease)   . Prediabetes   . Vitamin D deficiency   . ALLERGIC RHINITIS 12/25/2007  . PLEURAL EFFUSION 12/25/2007    Past Medical History:  Diagnosis Date  . Arthritis   . GERD (gastroesophageal reflux disease)   . Hyperlipidemia   . Hypertension   . Prediabetes   . Vitamin D deficiency     Family History  Problem Relation Age of Onset  . Diabetes Mother   . Rheum arthritis Mother   . Cancer Father        colon  . Cirrhosis Brother        alcoholic  .  Alcoholism Brother    Past Surgical History:  Procedure Laterality Date  . APPENDECTOMY     Social History   Social History Narrative  . Not on file   Immunization History  Administered Date(s) Administered  . Influenza,inj,Quad PF,6+ Mos 02/13/2016, 01/04/2017, 12/13/2017  . Td 03/15/2005  . Zoster 05/04/2016     Objective: Vital Signs: There were no vitals taken for this visit.   Physical Exam   Musculoskeletal Exam: ***  CDAI Exam: CDAI Score: - Patient Global: -; Provider Global: - Swollen: -; Tender: - Joint Exam   No joint exam has been documented for this visit   There is currently no information documented on the homunculus. Go to the Rheumatology activity and complete the homunculus joint exam.  Investigation: No additional findings.  Imaging: No results found.  Recent Labs: Lab Results  Component Value Date   WBC 8.0 03/21/2018   HGB 13.2 03/21/2018   PLT 364 03/21/2018   NA 141 03/21/2018   K 4.2 03/21/2018   CL 104 03/21/2018   CO2 25 03/21/2018   GLUCOSE 98 03/21/2018   BUN 15 03/21/2018   CREATININE 0.75 03/21/2018   BILITOT 0.4 03/21/2018   ALKPHOS 74 10/14/2016   AST 17 03/21/2018   ALT 23 03/21/2018   PROT 7.0 03/21/2018   ALBUMIN 4.4 10/14/2016   CALCIUM 9.3 03/21/2018   GFRAA 99 03/21/2018   QFTBGOLDPLUS NEGATIVE 03/21/2018    Speciality Comments: No specialty comments available.  Procedures:  No procedures performed Allergies: Patient  has no known allergies.   Assessment / Plan:     Visit Diagnoses: No diagnosis found.   Orders: No orders of the defined types were placed in this encounter.  No orders of the defined types were placed in this encounter.   Face-to-face time spent with patient was *** minutes. Greater than 50% of time was spent in counseling and coordination of care.  Follow-Up Instructions: No follow-ups on file.   Gearldine Bienenstockaylor M Zamarah Ullmer, PA-C  Note - This record has been created using Dragon software.   Chart creation errors have been sought, but may not always  have been located. Such creation errors do not reflect on  the standard of medical care.

## 2018-08-30 ENCOUNTER — Ambulatory Visit: Payer: Self-pay | Admitting: Rheumatology

## 2018-08-30 NOTE — Progress Notes (Signed)
Office Visit Note  Patient: Emma OremCynthia C Lerman             Date of Birth: 09/22/1955           MRN: [id number]             PCP: Lizbeth BarkSmothers, Deborah N, NP Referring: Glenetta BorgSmothers, Cathleen Cortieborah N, NP Visit Date: 09/04/2018 Occupation: @GUAROCC @  Subjective:  Medication monitoring    History of Present Illness: Emma OremCynthia C Tignor is a 63 y.o. female with history of seropositive rheumatoid arthritis and osteoarthritis.  She is on Enbrel 50 mg sq injections every week, Rasuvo 20 mg sq every 7 days, and folic acid 2 mg po daily.  She has not missed any doses of her Savella or Enbrel recently.  She denies any recent rheumatoid arthritis flares.  She continues to have left [facility] joint pain and occasional pain and stiffness in bilateral feet.  She denies any joint swelling at this time.  She states that she has been sleeping well overall.  She states that she has been working from home and feels her fatigue has been improving.  She denies any new concerns.    Activities of Daily Living:  Patient reports morning stiffness for 1 hour.   Patient Reports nocturnal pain.  Difficulty dressing/grooming: Denies Difficulty climbing stairs: Denies Difficulty getting out of chair: Denies Difficulty using hands for taps, buttons, cutlery, and/or writing: Denies  Review of Systems  Constitutional: Positive for fatigue.  HENT: Negative for mouth sores, mouth dryness and nose dryness.   Eyes: Negative for pain, itching, visual disturbance and dryness.  Respiratory: Negative for cough, hemoptysis, shortness of breath, wheezing and difficulty breathing.   Cardiovascular: Negative for chest pain, palpitations, hypertension and swelling in legs/feet.  Gastrointestinal: Negative for blood in stool, constipation and diarrhea.  Endocrine: Negative for increased urination.  Genitourinary: Negative for painful urination and pelvic pain.  Musculoskeletal: Positive for arthralgias, joint pain and morning stiffness. Negative for  joint swelling, myalgias, muscle weakness, muscle tenderness and myalgias.  Skin: Negative for color change, pallor, rash, hair loss, nodules/bumps, redness, skin tightness, ulcers and sensitivity to sunlight.  Allergic/Immunologic: Negative for susceptible to infections.  Neurological: Negative for dizziness, numbness, headaches, memory loss and weakness.  Hematological: Negative for swollen glands.  Psychiatric/Behavioral: Negative for depressed mood, confusion and sleep disturbance. The patient is not nervous/anxious.     PMFS History:  Patient Active Problem List   Diagnosis Date Noted  . Primary osteoarthritis of both hands 07/16/2016  . Rheumatoid arthritis involving multiple sites with positive rheumatoid factor (HCC) 07/15/2016  . ANA positive 07/15/2016  . History of gastroesophageal reflux (GERD) 07/15/2016  . Ds DNA antibody positive/ [facility] labs show patient does not have lupus 10/2016 07/15/2016  . Other fatigue 07/15/2016  . High risk medication use 07/15/2016  . Primary insomnia 07/15/2016  . Hypertension   . Hyperlipidemia   . GERD (gastroesophageal reflux disease)   . Prediabetes   . Vitamin D deficiency   . ALLERGIC RHINITIS 12/25/2007  . PLEURAL EFFUSION 12/25/2007    Past Medical History:  Diagnosis Date  . Arthritis   . GERD (gastroesophageal reflux disease)   . Hyperlipidemia   . Hypertension   . Prediabetes   . Vitamin D deficiency     Family History  Problem Relation Age of Onset  . Diabetes Mother   . Rheum arthritis Mother   . Cancer Father        colon  . Cirrhosis Brother  alcoholic  . Alcoholism Brother    Past Surgical History:  Procedure Laterality Date  . APPENDECTOMY     Social History   Social History Narrative  . Not on file   Immunization History  Administered Date(s) Administered  . Influenza,inj,Quad PF,6+ Mos 02/13/2016, 01/04/2017, 12/13/2017  . Td 03/15/2005  . Zoster 05/04/2016     Objective: Vital Signs: BP  132/85 (BP Location: Left Arm, Patient Position: Sitting, Cuff Size: Normal)   Pulse (!) 110   Resp 14   Ht 5' 7.5" (1.715 m)   Wt 182 lb (82.6 kg)   BMI 28.08 kg/m    Physical Exam Vitals signs and nursing note reviewed.  Constitutional:      Appearance: She is well-developed.  HENT:     Head: Normocephalic and atraumatic.  Eyes:     Conjunctiva/sclera: Conjunctivae normal.  Neck:     Musculoskeletal: Normal range of motion.  Cardiovascular:     Rate and Rhythm: Normal rate and regular rhythm.     Heart sounds: Normal heart sounds.  Pulmonary:     Effort: Pulmonary effort is normal.     Breath sounds: Normal breath sounds.  Abdominal:     General: Bowel sounds are normal.     Palpations: Abdomen is soft.  Lymphadenopathy:     Cervical: No cervical adenopathy.  Skin:    General: Skin is warm and dry.     Capillary Refill: Capillary refill takes less than 2 seconds.  Neurological:     Mental Status: She is alert and oriented to person, place, and time.  Psychiatric:        Behavior: Behavior normal.      Musculoskeletal Exam: C-spine, thoracic spine, lumbar spine good range of motion.  No midline spinal tenderness.  No SI joint tenderness.  Shoulders, elbows, wrist joints, MCPs, PIPs, DIPs good range of motion with no synovitis.  She has bilateral CMC joint synovial thickening.  Mild PIP and DIP synovial thickening.  Complete fist formation bilaterally.  Hip joints, knee joints, ankle joints, MTPs, PIPs and DIPs good range of motion no synovitis.  No warmth or effusion of bilateral knee joints.  No tenderness or swelling of ankle joints.  No tenderness over trochanteric bursa bilaterally  CDAI Exam: CDAI Score: 0.6  Patient Global: 3 mm; Provider Global: 3 mm Swollen: 0 ; Tender: 0  Joint Exam   No joint exam has been documented for this visit   There is currently no information documented on the homunculus. Go to the Rheumatology activity and complete the homunculus  joint exam.  Investigation: No additional findings.  Imaging: No results found.  Recent Labs: Lab Results  Component Value Date   WBC 8.0 03/21/2018   HGB 13.2 03/21/2018   PLT 364 03/21/2018   NA 141 03/21/2018   K 4.2 03/21/2018   CL 104 03/21/2018   CO2 25 03/21/2018   GLUCOSE 98 03/21/2018   BUN 15 03/21/2018   CREATININE 0.75 03/21/2018   BILITOT 0.4 03/21/2018   ALKPHOS 74 10/14/2016   AST 17 03/21/2018   ALT 23 03/21/2018   PROT 7.0 03/21/2018   ALBUMIN 4.4 10/14/2016   CALCIUM 9.3 03/21/2018   GFRAA 99 03/21/2018   QFTBGOLDPLUS NEGATIVE 03/21/2018    Speciality Comments: No specialty comments available.  Procedures:  No procedures performed Allergies: Patient has no known allergies.   Assessment / Plan:     Visit Diagnoses: Rheumatoid arthritis involving multiple sites with positive rheumatoid factor (HCC) -She  has no synovitis on exam.  She has not had any recent rheumatoid arthritis flares.  She is clinically doing well on Enbrel 50 mg subcutaneous injections every 7 days, Rasuvo 20 mg subcutaneous injections every 7 days, and folic acid 2 mg by mouth daily.  She has not missed any doses of Enbrel or Rasuvo recently.  She continues to have intermittent pain and stiffness in both feet but denies any joint swelling recently.  She has chronic left CMC joint pain.  She will continue on Enbrel 50 mg subcutaneous injections once weekly, Rasuvo 20 mg injections once weekly, and folic acid 2 mg by mouth daily.  Refill folic acid was sent to the pharmacy today.  She was advised to notify us if she develops increased joint pain or joint swelling.  She will follow up in 5 months.   High risk medication use - Enbrel mini 50 mg every 7 days, Rasuvo 20 mg every 7 days, and folic acid 1 mg 2 tablets daily. She stated on Enbrel on 05/03/17. Most recent TB gold negative on 03/21/2018 and will monitor yearly.  Most recent CBC/CMP within normal limits on 03/21/2018.  She is overdue for  labs.  CBC/CMP ordered for today and will monitor every 3 months.  Standing orders are in place.  She received the flu vaccine in October and previously Zostavax.  - Plan: CBC with Differential/Platelet, COMPLETE METABOLIC PANEL WITH GFR  Primary osteoarthritis of both hands: She has bilateral CMC joint synovial thickening.  She has tenderness of the left CMC joint on exam.  She has complete fist formation bilaterally.  She has mild PIP and DIP synovial thickening consistent with osteoarthritis of bilateral hands.  She has no synovitis on exam.  Joint protection and muscle strengthening were discussed  Primary osteoarthritis of both feet: She has intermittent pain and stiffness in both feet.  No joint swelling.  She has no tenderness on exam.  She synovial thickening of bilateral first MTP joints.  We discussed importance of wearing proper fitting shoes.  Trochanteric bursitis, right hip - Resolved   Other fatigue - Chronic but stable.   Primary insomnia -She has been getting more rest at night since she has started working from home.   Other medical conditions are listed as follows:  History of gastroesophageal reflux (GERD)   Prediabetes   History of vitamin D deficiency   History of hypertension   History of hyperlipidemia  History of pleural effusion /2009   Orders: Orders Placed This Encounter  Procedures  . CBC with Differential/Platelet  . COMPLETE METABOLIC PANEL WITH GFR   Meds ordered this encounter  Medications  . folic acid (FOLVITE) 1 MG tablet    Sig: TAKE 2 TABLETS(2 MG) BY MOUTH DAILY    Dispense:  180 tablet    Refill:  2    Face-to-face time spent with patient was 30 minutes. Greater than 50% of time was spent in counseling and coordination of care.  Follow-Up Instructions: Return in 5 months (on 02/04/2019) for Rheumatoid arthritis, Osteoarthritis.   Ofilia Neas, PA-C   I examined and evaluated the patient with Hazel Sams PA.  Patient had no  synovitis on my examination today.  Although she continues to have some arthralgias.  We will continue current treatment plan.  The plan of care was discussed as noted above.  Bo Merino, MD Note - This record has been created using Editor, commissioning.  Chart creation errors have been sought, but may not always  have been located. Such creation errors do not reflect on  the standard of medical care.

## 2018-09-04 ENCOUNTER — Ambulatory Visit: Payer: 59 | Admitting: Rheumatology

## 2018-09-04 ENCOUNTER — Other Ambulatory Visit: Payer: Self-pay

## 2018-09-04 ENCOUNTER — Encounter: Payer: Self-pay | Admitting: Rheumatology

## 2018-09-04 VITALS — BP 132/85 | HR 110 | Resp 14 | Ht 67.5 in | Wt 182.0 lb

## 2018-09-04 DIAGNOSIS — R7303 Prediabetes: Secondary | ICD-10-CM | POA: Diagnosis not present

## 2018-09-04 DIAGNOSIS — M0579 Rheumatoid arthritis with rheumatoid factor of multiple sites without organ or systems involvement: Secondary | ICD-10-CM | POA: Diagnosis not present

## 2018-09-04 DIAGNOSIS — Z79899 Other long term (current) drug therapy: Secondary | ICD-10-CM | POA: Diagnosis not present

## 2018-09-04 DIAGNOSIS — M7061 Trochanteric bursitis, right hip: Secondary | ICD-10-CM

## 2018-09-04 DIAGNOSIS — Z8719 Personal history of other diseases of the digestive system: Secondary | ICD-10-CM

## 2018-09-04 DIAGNOSIS — M19072 Primary osteoarthritis, left ankle and foot: Secondary | ICD-10-CM

## 2018-09-04 DIAGNOSIS — Z8709 Personal history of other diseases of the respiratory system: Secondary | ICD-10-CM

## 2018-09-04 DIAGNOSIS — M19071 Primary osteoarthritis, right ankle and foot: Secondary | ICD-10-CM | POA: Diagnosis not present

## 2018-09-04 DIAGNOSIS — Z8639 Personal history of other endocrine, nutritional and metabolic disease: Secondary | ICD-10-CM

## 2018-09-04 DIAGNOSIS — R5383 Other fatigue: Secondary | ICD-10-CM

## 2018-09-04 DIAGNOSIS — Z8679 Personal history of other diseases of the circulatory system: Secondary | ICD-10-CM

## 2018-09-04 DIAGNOSIS — M19042 Primary osteoarthritis, left hand: Secondary | ICD-10-CM

## 2018-09-04 DIAGNOSIS — M19041 Primary osteoarthritis, right hand: Secondary | ICD-10-CM | POA: Diagnosis not present

## 2018-09-04 DIAGNOSIS — R69 Illness, unspecified: Secondary | ICD-10-CM | POA: Diagnosis not present

## 2018-09-04 DIAGNOSIS — F5101 Primary insomnia: Secondary | ICD-10-CM

## 2018-09-04 MED ORDER — FOLIC ACID 1 MG PO TABS: ORAL_TABLET | ORAL | 2 refills | Status: DC

## 2018-09-05 LAB — COMPLETE METABOLIC PANEL WITH GFR
AG Ratio: 2.1 (calc) (ref 1.0–2.5)
ALT: 31 U/L — ABNORMAL HIGH (ref 6–29)
AST: 23 U/L (ref 10–35)
Albumin: 4.7 g/dL (ref 3.6–5.1)
Alkaline phosphatase (APISO): 83 U/L (ref 37–153)
BUN: 15 mg/dL (ref 7–25)
CO2: 25 mmol/L (ref 20–32)
Calcium: 10.4 mg/dL (ref 8.6–10.4)
Chloride: 104 mmol/L (ref 98–110)
Creat: 0.84 mg/dL (ref 0.50–0.99)
GFR, Est African American: 86 mL/min/{1.73_m2} (ref >=60)
GFR, Est Non African American: 74 mL/min/{1.73_m2} (ref >=60)
Globulin: 2.2 g/dL (calc) (ref 1.9–3.7)
Glucose, Bld: 107 mg/dL — ABNORMAL HIGH (ref 65–99)
Potassium: 4.4 mmol/L (ref 3.5–5.3)
Sodium: 140 mmol/L (ref 135–146)
Total Bilirubin: 0.5 mg/dL (ref 0.2–1.2)
Total Protein: 6.9 g/dL (ref 6.1–8.1)

## 2018-09-05 LAB — CBC WITH DIFFERENTIAL/PLATELET
Absolute Monocytes: 569 cells/uL (ref 200–950)
Basophils Absolute: 22 cells/uL (ref 0–200)
Basophils Relative: 0.3 %
Eosinophils Absolute: 79 cells/uL (ref 15–500)
Eosinophils Relative: 1.1 %
HCT: 37.9 % (ref 35.0–45.0)
Hemoglobin: 12.8 g/dL (ref 11.7–15.5)
Lymphs Abs: 1555 cells/uL (ref 850–3900)
MCH: 30.3 pg (ref 27.0–33.0)
MCHC: 33.8 g/dL (ref 32.0–36.0)
MCV: 89.8 fL (ref 80.0–100.0)
MPV: 10.7 fL (ref 7.5–12.5)
Monocytes Relative: 7.9 %
Neutro Abs: 4975 cells/uL (ref 1500–7800)
Neutrophils Relative %: 69.1 %
Platelets: 317 10*3/uL (ref 140–400)
RBC: 4.22 10*6/uL (ref 3.80–5.10)
RDW: 13.6 % (ref 11.0–15.0)
Total Lymphocyte: 21.6 %
WBC: 7.2 10*3/uL (ref 3.8–10.8)

## 2018-09-05 NOTE — Progress Notes (Signed)
ALT is borderline elevated-31. Please advise patient to avoid NSAIDs, tylenol, and alcohol.  We will continue to monitor.  Rest of CMP WNL.  CBC WNL.

## 2018-09-06 ENCOUNTER — Other Ambulatory Visit: Payer: Self-pay | Admitting: *Deleted

## 2018-09-06 MED ORDER — ENBREL MINI 50 MG/ML ~~LOC~~ SOCT: 50.0000 mg | SUBCUTANEOUS | 0 refills | Status: DC

## 2018-09-06 NOTE — Telephone Encounter (Signed)
Refill Request received via fax  Last Visit: 09/04/18 Next Visit: 01/30/19 Labs: 09/04/18 ALT is borderline elevated-31.Rest of CMP WNL. CBC WNL. TB Gold: 03/21/18 Neg   Okay to refill per Dr. Estanislado Pandy

## 2018-10-05 ENCOUNTER — Telehealth: Payer: Self-pay | Admitting: Rheumatology

## 2018-10-05 MED ORDER — RASUVO 20 MG/0.4ML ~~LOC~~ SOAJ: SUBCUTANEOUS | 0 refills | Status: DC

## 2018-10-05 NOTE — Telephone Encounter (Signed)
[employer] requesting a refill on Resuvo for patient.Fax# [phone]

## 2018-10-05 NOTE — Telephone Encounter (Signed)
Last Visit: 09/04/18 Next Visit: 01/30/19 Labs: 09/04/18 ALT is borderline elevated-31.Rest of CMP WNL. CBC WNL.  Okay to refill per Dr. Estanislado Pandy

## 2018-12-13 ENCOUNTER — Other Ambulatory Visit: Payer: Self-pay | Admitting: Rheumatology

## 2018-12-13 NOTE — Telephone Encounter (Signed)
Last Visit: 09/04/18 Next Visit: 01/30/19 Labs: 09/04/18 ALT is borderline elevated-31.Rest of CMP WNL. CBC WNL. TB Gold: 03/21/18 Neg   Attempted to contact the patient and left message for patient to advise she id due to update labs  Okay to refill 30 day supply per Dr. Estanislado Pandy

## 2018-12-22 DIAGNOSIS — Z23 Encounter for immunization: Secondary | ICD-10-CM | POA: Diagnosis not present

## 2019-01-16 NOTE — Progress Notes (Addendum)
Office Visit Note  Patient: Emma Warner             Date of Birth: 1955-11-27           MRN: [id number]             PCP: Lizbeth BarkSmothers, Deborah N, NP Referring: Glenetta BorgSmothers, Cathleen Cortieborah N, NP Visit Date: 01/30/2019 Occupation: @GUAROCC @  Subjective:  Medication monitoring     History of Present Illness: Emma OremCynthia C Warner is a 63 y.o. female with history of seropositive rheumatoid arthritis and osteoarthritis.  She is on Enbrel 50 mg sq injections every week, Rasuvo 20 mg sq once weekly, and folic acid 2 mg po daily.  She denies any recent flares. She denies any joint pain or joint swelling currently.  She has occasional discomfort in the left wrist joint but denies any joint swelling or tenderness at this time.  She does not want a cortisone injection at this time.     Activities of Daily Living:  Patient reports morning stiffness for 5 minutes.   Patient Denies nocturnal pain.  Difficulty dressing/grooming: Denies Difficulty climbing stairs: Denies Difficulty getting out of chair: Denies Difficulty using hands for taps, buttons, cutlery, and/or writing: Denies  Review of Systems  Constitutional: Positive for fatigue.  HENT: Negative for mouth sores, mouth dryness and nose dryness.   Eyes: Negative for pain, itching, visual disturbance and dryness.  Respiratory: Negative for cough, hemoptysis, shortness of breath, wheezing and difficulty breathing.   Cardiovascular: Negative for chest pain, palpitations, hypertension and swelling in legs/feet.  Gastrointestinal: Negative for blood in stool, constipation and diarrhea.  Endocrine: Negative for increased urination.  Genitourinary: Negative for difficulty urinating and painful urination.  Musculoskeletal: Positive for morning stiffness. Negative for arthralgias, joint pain, joint swelling, myalgias, muscle weakness, muscle tenderness and myalgias.  Skin: Negative for color change, pallor, rash, hair loss, nodules/bumps, skin tightness, ulcers  and sensitivity to sunlight.  Allergic/Immunologic: Negative for susceptible to infections.  Neurological: Negative for dizziness, light-headedness, numbness, headaches, memory loss and weakness.  Hematological: Negative for bruising/bleeding tendency and swollen glands.  Psychiatric/Behavioral: Negative for depressed mood, confusion and sleep disturbance. The patient is not nervous/anxious.     PMFS History:  Patient Active Problem List   Diagnosis Date Noted   Primary osteoarthritis of both hands 07/16/2016   Rheumatoid arthritis involving multiple sites with positive rheumatoid factor (HCC) 07/15/2016   ANA positive 07/15/2016   History of gastroesophageal reflux (GERD) 07/15/2016   Ds DNA antibody positive/ [facility] labs show patient does not have lupus 10/2016 07/15/2016   Other fatigue 07/15/2016   High risk medication use 07/15/2016   Primary insomnia 07/15/2016   Hypertension    Hyperlipidemia    GERD (gastroesophageal reflux disease)    Prediabetes    Vitamin D deficiency    ALLERGIC RHINITIS 12/25/2007   PLEURAL EFFUSION 12/25/2007    Past Medical History:  Diagnosis Date   Arthritis    GERD (gastroesophageal reflux disease)    Hyperlipidemia    Hypertension    Prediabetes    Vitamin D deficiency     Family History  Problem Relation Age of Onset   Diabetes Mother    Rheum arthritis Mother    Cancer Father        colon   Cirrhosis Brother        alcoholic   Alcoholism Brother    Past Surgical History:  Procedure Laterality Date   APPENDECTOMY     Social History  Social History Narrative   Not on file   Immunization History  Administered Date(s) Administered   Influenza,inj,Quad PF,6+ Mos 02/13/2016, 01/04/2017, 12/13/2017   Influenza-Unspecified 12/22/2018   Td 03/15/2005   Zoster 05/04/2016     Objective: Vital Signs: BP (!) 152/78 (BP Location: Left Arm, Patient Position: Sitting, Cuff Size: Normal)    Pulse (!) 54     Resp 13    Ht 5' 7.5" (1.715 m)    Wt 186 lb (84.4 kg)    BMI 28.70 kg/m    Physical Exam Vitals signs and nursing note reviewed.  Constitutional:      Appearance: She is well-developed.  HENT:     Head: Normocephalic and atraumatic.  Eyes:     Conjunctiva/sclera: Conjunctivae normal.  Neck:     Musculoskeletal: Normal range of motion.  Cardiovascular:     Rate and Rhythm: Normal rate and regular rhythm.     Heart sounds: Normal heart sounds.  Pulmonary:     Effort: Pulmonary effort is normal.     Breath sounds: Normal breath sounds.  Abdominal:     General: Bowel sounds are normal.     Palpations: Abdomen is soft.  Lymphadenopathy:     Cervical: No cervical adenopathy.  Skin:    General: Skin is warm and dry.     Capillary Refill: Capillary refill takes less than 2 seconds.  Neurological:     Mental Status: She is alert and oriented to person, place, and time.  Psychiatric:        Behavior: Behavior normal.      Musculoskeletal Exam: C-spine, thoracic spine, lumbar spine good range of motion.  No midline spinal tenderness.  No SI joint tenderness.  Shoulder joints, elbows, wrist joints, MCPs, PIPs, DIPs good range of motion with no synovitis.  She has complete fist formation bilaterally.  PIP and DIP synovial thickening consistent with osteoarthritis of both hands.  Mild synovial thickening of the left wrist but no tenderness or inflammation was noted.  Hip joints, knee joints, ankle joints, MTPs, PIPs, DIPs good range of motion no synovitis.  No warmth or effusion of bilateral knee joints.  No tenderness or swelling of ankle joints.  No tenderness of MTP joints.  CDAI Exam: CDAI Score: 0.6  Patient Global: 3 mm; Provider Global: 3 mm Swollen: 0 ; Tender: 0  Joint Exam   No joint exam has been documented for this visit   There is currently no information documented on the homunculus. Go to the Rheumatology activity and complete the homunculus joint  exam.  Investigation: No additional findings.  Imaging: No results found.  Recent Labs: Lab Results  Component Value Date   WBC 9.5 01/26/2019   HGB 13.0 01/26/2019   PLT 297 01/26/2019   NA 140 01/26/2019   K 3.9 01/26/2019   CL 103 01/26/2019   CO2 27 01/26/2019   GLUCOSE 91 01/26/2019   BUN 13 01/26/2019   CREATININE 0.71 01/26/2019   BILITOT 0.3 01/26/2019   ALKPHOS 74 10/14/2016   AST 20 01/26/2019   ALT 25 01/26/2019   PROT 7.3 01/26/2019   ALBUMIN 4.4 10/14/2016   CALCIUM 9.7 01/26/2019   GFRAA 105 01/26/2019   QFTBGOLDPLUS NEGATIVE 01/26/2019    Speciality Comments: No specialty comments available.  Procedures:  No procedures performed Allergies: Patient has no known allergies.   Assessment / Plan:     Visit Diagnoses: Rheumatoid arthritis involving multiple sites with positive rheumatoid factor (HCC): She has synovitis on  exam.  She has not had any recent rheumatoid arthritis flares.  She is clinically doing well on Enbrel 50 mg subcutaneous injections every 7 days, Rasuvo 20 mg subcutaneous injections every 7 days, and folic acid 2 mg by mouth daily.  She has not missed any doses of these medications recently.  She has no joint pain or joint swelling at this time.  She has not noticed any increased morning stiffness.  She will continue on the current treatment regimen.  Refills of these medications will be sent to the pharmacy today.  She was advised to notify us if she develops increased joint pain or joint swelling.  She will follow-up in the office in 5 months.  High risk medication use - Enbrel Mini 50 mg/ml every 7 days, Rasuvo 20 mg every 7 days, and folic acid 1 mg 2 tablets daily.  Last TB gold negative 01/26/2019 and will monitor yearly.  Most recent CBC/CMP within normal limits on 01/26/2019 and will monitor every 3 months. She stated on Enbrel on 05/03/17.  Primary osteoarthritis of both hands: She has PIP and DIP synovial thickening consistent with  osteoarthritis of both hands.  No tenderness or inflammation. Complete fist formation bilaterally.  Joint protection and muscle strengthening were discussed.   Primary osteoarthritis of both feet: She has no discomfort or inflammation in her feet at this time.  No tenderness of ankle joints or MTP joints.   Trochanteric bursitis, right hip: Resolved   Other fatigue: She experiences intermittent fatigue.  We discussed the importance of staying active and exercising on a regular basis.   Primary insomnia: She has been sleeping ok at night.   Ds DNA antibody positive/ [facility] labs show patient does not have lupus 10/2016: She has no clinical features of systemic lupus at this time.   Other medical conditions are listed as follows:   History of gastroesophageal reflux (GERD)  Prediabetes  History of vitamin D deficiency  History of hypertension  History of hyperlipidemia  History of pleural effusion /2009  Orders: No orders of the defined types were placed in this encounter.  No orders of the defined types were placed in this encounter.     Follow-Up Instructions: Return in about 5 months (around 06/30/2019) for Rheumatoid arthritis, Osteoarthritis.   Ofilia Neas, PA-C   I examined and evaluated the patient with Hazel Sams PA.  Patient had no synovitis on examination today.  She has been doing well on Enbrel and methotrexate combination.  We will continue to monitor.  The plan of care was discussed as noted above.  Bo Merino, MD  Note - This record has been created using Editor, commissioning.  Chart creation errors have been sought, but may not always  have been located. Such creation errors do not reflect on  the standard of medical care.

## 2019-01-26 ENCOUNTER — Other Ambulatory Visit: Payer: Self-pay

## 2019-01-26 DIAGNOSIS — Z111 Encounter for screening for respiratory tuberculosis: Secondary | ICD-10-CM

## 2019-01-26 DIAGNOSIS — Z79899 Other long term (current) drug therapy: Secondary | ICD-10-CM

## 2019-01-28 LAB — CBC WITH DIFFERENTIAL/PLATELET
Absolute Monocytes: 865 cells/uL (ref 200–950)
Basophils Absolute: 48 cells/uL (ref 0–200)
Basophils Relative: 0.5 %
Eosinophils Absolute: 171 cells/uL (ref 15–500)
Eosinophils Relative: 1.8 %
HCT: 38.2 % (ref 35.0–45.0)
Hemoglobin: 13 g/dL (ref 11.7–15.5)
Lymphs Abs: 2090 cells/uL (ref 850–3900)
MCH: 29.6 pg (ref 27.0–33.0)
MCHC: 34 g/dL (ref 32.0–36.0)
MCV: 87 fL (ref 80.0–100.0)
MPV: 12.2 fL (ref 7.5–12.5)
Monocytes Relative: 9.1 %
Neutro Abs: 6327 cells/uL (ref 1500–7800)
Neutrophils Relative %: 66.6 %
Platelets: 297 10*3/uL (ref 140–400)
RBC: 4.39 10*6/uL (ref 3.80–5.10)
RDW: 12.8 % (ref 11.0–15.0)
Total Lymphocyte: 22 %
WBC: 9.5 10*3/uL (ref 3.8–10.8)

## 2019-01-28 LAB — COMPLETE METABOLIC PANEL WITH GFR
AG Ratio: 1.7 (calc) (ref 1.0–2.5)
ALT: 25 U/L (ref 6–29)
AST: 20 U/L (ref 10–35)
Albumin: 4.6 g/dL (ref 3.6–5.1)
Alkaline phosphatase (APISO): 84 U/L (ref 37–153)
BUN: 13 mg/dL (ref 7–25)
CO2: 27 mmol/L (ref 20–32)
Calcium: 9.7 mg/dL (ref 8.6–10.4)
Chloride: 103 mmol/L (ref 98–110)
Creat: 0.71 mg/dL (ref 0.50–0.99)
GFR, Est African American: 105 mL/min/{1.73_m2} (ref >=60)
GFR, Est Non African American: 91 mL/min/{1.73_m2} (ref >=60)
Globulin: 2.7 g/dL (calc) (ref 1.9–3.7)
Glucose, Bld: 91 mg/dL (ref 65–99)
Potassium: 3.9 mmol/L (ref 3.5–5.3)
Sodium: 140 mmol/L (ref 135–146)
Total Bilirubin: 0.3 mg/dL (ref 0.2–1.2)
Total Protein: 7.3 g/dL (ref 6.1–8.1)

## 2019-01-28 LAB — QUANTIFERON-TB GOLD PLUS
Mitogen-NIL: >10 IU/mL
NIL: 0.02 IU/mL
QuantiFERON-TB Gold Plus: NEGATIVE
TB1-NIL: 0 IU/mL
TB2-NIL: 0 IU/mL

## 2019-01-29 NOTE — Progress Notes (Signed)
WNLs

## 2019-01-30 ENCOUNTER — Other Ambulatory Visit: Payer: Self-pay

## 2019-01-30 ENCOUNTER — Ambulatory Visit: Payer: 59 | Admitting: Rheumatology

## 2019-01-30 ENCOUNTER — Encounter: Payer: Self-pay | Admitting: Rheumatology

## 2019-01-30 VITALS — BP 152/78 | HR 54 | Resp 13 | Ht 67.5 in | Wt 186.0 lb

## 2019-01-30 DIAGNOSIS — M7061 Trochanteric bursitis, right hip: Secondary | ICD-10-CM

## 2019-01-30 DIAGNOSIS — M19071 Primary osteoarthritis, right ankle and foot: Secondary | ICD-10-CM | POA: Diagnosis not present

## 2019-01-30 DIAGNOSIS — R768 Other specified abnormal immunological findings in serum: Secondary | ICD-10-CM | POA: Diagnosis not present

## 2019-01-30 DIAGNOSIS — M0579 Rheumatoid arthritis with rheumatoid factor of multiple sites without organ or systems involvement: Secondary | ICD-10-CM | POA: Diagnosis not present

## 2019-01-30 DIAGNOSIS — Z8709 Personal history of other diseases of the respiratory system: Secondary | ICD-10-CM

## 2019-01-30 DIAGNOSIS — F5101 Primary insomnia: Secondary | ICD-10-CM

## 2019-01-30 DIAGNOSIS — R5383 Other fatigue: Secondary | ICD-10-CM | POA: Diagnosis not present

## 2019-01-30 DIAGNOSIS — R7303 Prediabetes: Secondary | ICD-10-CM | POA: Diagnosis not present

## 2019-01-30 DIAGNOSIS — M19072 Primary osteoarthritis, left ankle and foot: Secondary | ICD-10-CM

## 2019-01-30 DIAGNOSIS — R69 Illness, unspecified: Secondary | ICD-10-CM | POA: Diagnosis not present

## 2019-01-30 DIAGNOSIS — Z8719 Personal history of other diseases of the digestive system: Secondary | ICD-10-CM | POA: Diagnosis not present

## 2019-01-30 DIAGNOSIS — M19041 Primary osteoarthritis, right hand: Secondary | ICD-10-CM

## 2019-01-30 DIAGNOSIS — Z79899 Other long term (current) drug therapy: Secondary | ICD-10-CM

## 2019-01-30 DIAGNOSIS — Z8639 Personal history of other endocrine, nutritional and metabolic disease: Secondary | ICD-10-CM

## 2019-01-30 DIAGNOSIS — M19042 Primary osteoarthritis, left hand: Secondary | ICD-10-CM

## 2019-01-30 DIAGNOSIS — Z8679 Personal history of other diseases of the circulatory system: Secondary | ICD-10-CM

## 2019-01-30 MED ORDER — RASUVO 20 MG/0.4ML ~~LOC~~ SOAJ: SUBCUTANEOUS | 0 refills | Status: DC

## 2019-01-30 MED ORDER — ENBREL MINI 50 MG/ML ~~LOC~~ SOCT: 50.0000 mg | SUBCUTANEOUS | 0 refills | Status: DC

## 2019-01-30 MED ORDER — FOLIC ACID 1 MG PO TABS: ORAL_TABLET | ORAL | 2 refills | Status: DC

## 2019-01-30 NOTE — Patient Instructions (Signed)
Standing Labs We placed an order today for your standing lab work.    Please come back and get your standing labs in February and every 3 months  We have open lab daily Monday through Thursday from 8:30-12:30 PM and 1:30-4:30 PM and Friday from 8:30-12:30 PM and 1:30-4:00 PM at the office of Dr. Shaili Deveshwar.   You may experience shorter wait times on Monday and Friday afternoons. The office is located at [address], [address], [place], [place] [address] No appointment is necessary.   Labs are drawn by [facility].  You may receive a bill from [facility] for your lab work.  If you wish to have your labs drawn at another location, please call the office 24 hours in advance to send orders.  If you have any questions regarding directions or hours of operation,  please call [phone].   Just as a reminder please drink plenty of water prior to coming for your lab work. Thanks!  

## 2019-02-01 ENCOUNTER — Telehealth: Payer: Self-pay | Admitting: *Deleted

## 2019-02-01 ENCOUNTER — Telehealth: Payer: Self-pay | Admitting: Rheumatology

## 2019-02-01 NOTE — Telephone Encounter (Signed)
[facility] has contacted the office stating patient's Rasuvo needs a prior authorization. Thanks!

## 2019-02-01 NOTE — Telephone Encounter (Signed)
Received fax from [place] with additional clinical questions for prior authorization request for Rasuvo.  Form completed and faxed to [phone].  We will update when we receive a response.   Mariella Saa, PharmD, [place], [place] Clinical Specialty Pharmacist [phone]  02/01/2019 11:53 AM

## 2019-02-01 NOTE — Telephone Encounter (Signed)
Patient left a message at 4:12pm requesting someone to call [facility] with a PA for her Resuvo. Patient unable to have medication delivered until PA has been obtained. Please call [facility] at [phone]

## 2019-02-02 NOTE — Telephone Encounter (Signed)
PA was submitted to insurance yesterday. It is currently pending. Will update patient once response is received.

## 2019-02-06 ENCOUNTER — Telehealth: Payer: Self-pay | Admitting: Rheumatology

## 2019-02-06 NOTE — Telephone Encounter (Signed)
Patient called checking on her prescription of Methotrexate.  Patient states the office has been working on getting a prior authorization and she is due to take her injection on Friday, 12/27.  Patient requested a return call.

## 2019-02-06 NOTE — Telephone Encounter (Signed)
[facility], Rasuvo prior Josem Kaufmann is still pending with their clinician. Rep advised they need chart notes to go with PA request. Can you please fax patient's last OV note to [phone] and write urgent and the PA# on the coversheet?  PA# [id number]  [facility] Phone# [phone]  Called patient and advised.  Thanks!  Beatriz Chancellor, CPhT

## 2019-02-06 NOTE — Telephone Encounter (Signed)
attempted to contact patient and left message on machine to advise patient she can come pick up a sample of rasuvo.   Rachael- please advise patient of PA status. Thanks!

## 2019-02-06 NOTE — Telephone Encounter (Signed)
Medication Samples have been provided to the patient.  Drug name: Rasuvo     Strength: 20mg      Qty: 2 LOT: [id number]  Exp.Date: 08/2019  Dosing instructions: Inject 20mg  into the skin once weekly.   The patient has been instructed regarding the correct time, dose, and frequency of taking this medication, including desired effects and most common side effects.   Earnestine Mealing 4:54 PM 02/06/2019

## 2019-02-06 NOTE — Telephone Encounter (Signed)
Last OV note has been faxed to number provided.

## 2019-02-14 NOTE — Telephone Encounter (Signed)
Received a fax regarding Prior Authorization from [employer] for [place]. Authorization has been DENIED because additional requesting information was not submitted before determination. Plan requires patient to have tried/failed oral methotrexate.  Will submit Appeal for patient.  Will send document to scan center.  Phone#[phone]

## 2019-02-16 ENCOUNTER — Encounter: Payer: Self-pay | Admitting: Pharmacist

## 2019-02-16 NOTE — Telephone Encounter (Signed)
Appeal letter faxed to [facility] and will update when we hear a response.  Fax # [phone]   Mariella Saa, PharmD, [place], CPP Clinical Specialty Pharmacist [phone]  02/16/2019 11:59 AM

## 2019-02-16 NOTE — Progress Notes (Signed)
Appeal Letter for Rasuvo faxed to [phone].

## 2019-02-22 ENCOUNTER — Telehealth: Payer: Self-pay | Admitting: Rheumatology

## 2019-02-22 NOTE — Telephone Encounter (Signed)
Spoke with Mariella Saa, PharmD and she has sent an appeal letter for Rasuvo. Insurance company has confirmed appeal has been received but no update. Amber will update with response.   Advised patient and she verbalized understanding. Advised patient she can pick up 2 samples. Patient will stop by tomorrow to pick up.

## 2019-02-22 NOTE — Telephone Encounter (Signed)
Patient states Resuvo injections have still been declined by insurance. Patient wants to know if she can just get the pills again? Please call to advise.

## 2019-02-22 NOTE — Telephone Encounter (Signed)
Called [employer] to check status of Rasuvo appeal. Rep Nell advised denial was overturned on 02/21/2019. And request was sent from [place] to [facility] to enter authorization. Rep advised to follow up with [facility] on Monday to see if their end has been updated and patient will be able to fill medication.  [employer] Phone# [phone] [facility]#[phone]  Case# [id number]  11:43 AM Beatriz Chancellor, CPhT

## 2019-02-26 NOTE — Telephone Encounter (Signed)
Received notification from [facility] regarding a prior authorization for RASUVO. Authorization has been APPROVED from 02/21/19 to 02/21/20.   Will send document to scan center.  Authorization # [id number] Phone # [phone]  Called patient and advised.

## 2019-03-28 ENCOUNTER — Other Ambulatory Visit: Payer: Self-pay | Admitting: Physician Assistant

## 2019-04-16 ENCOUNTER — Telehealth: Payer: Self-pay | Admitting: Pharmacy Technician

## 2019-04-16 NOTE — Telephone Encounter (Signed)
Submitted a Prior Authorization request to [employer] for ENBREL via Cover My Meds. Will update once we receive a response.

## 2019-04-19 ENCOUNTER — Other Ambulatory Visit: Payer: Self-pay | Admitting: Physician Assistant

## 2019-04-19 ENCOUNTER — Telehealth: Payer: Self-pay | Admitting: Pharmacist

## 2019-04-19 NOTE — Telephone Encounter (Addendum)
Received a refill request from [facility] for Enbrel [place] via the phone.  Gave verbal order for Enbrel [place] refill to pharmacist Shawn.  Verlin Fester, PharmD, [place], CPP Clinical Specialty Pharmacist [phone]  04/19/2019 8:40 AM

## 2019-04-19 NOTE — Telephone Encounter (Signed)
Last Visit: 01/30/2019  Next Visit: 06/26/2019 Labs: 01/26/2019 WNL  TB Gold: 01/26/2019 negative   Okay to refill per Dr. Corliss Skains.

## 2019-04-30 ENCOUNTER — Other Ambulatory Visit: Payer: Self-pay | Admitting: Physician Assistant

## 2019-04-30 NOTE — Telephone Encounter (Signed)
Last Visit: 01/30/2019 Next Visit: 06/26/2019 Labs: 01/26/2019 WNL   Advised patient she is due to update labs, patient will update as soon as possible.   Okay to refill per Dr. Corliss Skains.

## 2019-05-02 NOTE — Telephone Encounter (Signed)
Received notification from [facility] regarding a prior authorization for ENBREL. Authorization has been APPROVED from 04/18/19 to 04/17/20.   Will send document to scan center.  Authorization # [id number] Phone # [phone]

## 2019-05-17 ENCOUNTER — Telehealth: Payer: Self-pay | Admitting: Rheumatology

## 2019-05-17 NOTE — Telephone Encounter (Signed)
Patient advised per Dr. Corliss Skains she may have her labs drawn the same day as having Covid vaccine.

## 2019-05-17 NOTE — Telephone Encounter (Signed)
Patient called stating she was planning to come in the office for labwork on Monday, 05/21/19 and is also scheduled for her COVID vaccine that same day at 11:00 am.  Patient is requesting a return call to let her know if there is any medical reason she cannot do both on the same day.

## 2019-05-21 ENCOUNTER — Ambulatory Visit: Payer: 59 | Attending: Internal Medicine

## 2019-05-21 DIAGNOSIS — Z23 Encounter for immunization: Secondary | ICD-10-CM | POA: Insufficient documentation

## 2019-05-21 NOTE — Progress Notes (Signed)
   Covid-19 Vaccination Clinic  Name:  TEMECA SOMMA    MRN: [id number] DOB: 10-14-1955  05/21/2019  Ms. Bornemann was observed post Covid-19 immunization for 15 minutes without incident. She was provided with Vaccine Information Sheet and instruction to access the V-Safe system.   Ms. Denslow was instructed to call 911 with any severe reactions post vaccine: Marland Kitchen Difficulty breathing  . Swelling of face and throat  . A fast heartbeat  . A bad rash all over body  . Dizziness and weakness   Immunizations Administered    Name Date Dose VIS Date Route   Pfizer COVID-19 Vaccine 05/21/2019 10:50 AM 0.3 mL 02/23/2019 Intramuscular   Manufacturer: [employer], [employer]   Lot: [id number]   NDC: [address]-4

## 2019-05-30 ENCOUNTER — Other Ambulatory Visit: Payer: Self-pay

## 2019-05-30 DIAGNOSIS — Z79899 Other long term (current) drug therapy: Secondary | ICD-10-CM

## 2019-05-31 LAB — COMPLETE METABOLIC PANEL WITH GFR
AG Ratio: 1.7 (calc) (ref 1.0–2.5)
ALT: 24 U/L (ref 6–29)
AST: 20 U/L (ref 10–35)
Albumin: 4.3 g/dL (ref 3.6–5.1)
Alkaline phosphatase (APISO): 84 U/L (ref 37–153)
BUN: 11 mg/dL (ref 7–25)
CO2: 28 mmol/L (ref 20–32)
Calcium: 9.8 mg/dL (ref 8.6–10.4)
Chloride: 104 mmol/L (ref 98–110)
Creat: 0.79 mg/dL (ref 0.50–0.99)
GFR, Est African American: 92 mL/min/{1.73_m2} (ref >=60)
GFR, Est Non African American: 80 mL/min/{1.73_m2} (ref >=60)
Globulin: 2.6 g/dL (calc) (ref 1.9–3.7)
Glucose, Bld: 96 mg/dL (ref 65–99)
Potassium: 4 mmol/L (ref 3.5–5.3)
Sodium: 140 mmol/L (ref 135–146)
Total Bilirubin: 0.3 mg/dL (ref 0.2–1.2)
Total Protein: 6.9 g/dL (ref 6.1–8.1)

## 2019-05-31 LAB — CBC WITH DIFFERENTIAL/PLATELET
Absolute Monocytes: 646 cells/uL (ref 200–950)
Basophils Absolute: 43 cells/uL (ref 0–200)
Basophils Relative: 0.5 %
Eosinophils Absolute: 111 cells/uL (ref 15–500)
Eosinophils Relative: 1.3 %
HCT: 39.2 % (ref 35.0–45.0)
Hemoglobin: 13 g/dL (ref 11.7–15.5)
Lymphs Abs: 2312 cells/uL (ref 850–3900)
MCH: 29.5 pg (ref 27.0–33.0)
MCHC: 33.2 g/dL (ref 32.0–36.0)
MCV: 88.9 fL (ref 80.0–100.0)
MPV: 11.1 fL (ref 7.5–12.5)
Monocytes Relative: 7.6 %
Neutro Abs: 5389 cells/uL (ref 1500–7800)
Neutrophils Relative %: 63.4 %
Platelets: 301 10*3/uL (ref 140–400)
RBC: 4.41 10*6/uL (ref 3.80–5.10)
RDW: 13.6 % (ref 11.0–15.0)
Total Lymphocyte: 27.2 %
WBC: 8.5 10*3/uL (ref 3.8–10.8)

## 2019-05-31 NOTE — Progress Notes (Signed)
CBC and CMP normal

## 2019-06-20 ENCOUNTER — Ambulatory Visit: Payer: 59 | Attending: Internal Medicine

## 2019-06-20 DIAGNOSIS — Z23 Encounter for immunization: Secondary | ICD-10-CM

## 2019-06-20 NOTE — Progress Notes (Signed)
   Covid-19 Vaccination Clinic  Name:  Emma Warner    MRN: [id number] DOB: 09/13/1955  06/20/2019  [name] was observed post Covid-19 immunization for 15 minutes without incident. She was provided with Vaccine Information Sheet and instruction to access the V-Safe system.   [name] was instructed to call 911 with any severe reactions post vaccine: Marland Kitchen Difficulty breathing  . Swelling of face and throat  . A fast heartbeat  . A bad rash all over body  . Dizziness and weakness   Immunizations Administered    Name Date Dose VIS Date Route   Pfizer COVID-19 Vaccine 06/20/2019 11:06 AM 0.3 mL 02/23/2019 Intramuscular   Manufacturer: [employer], [employer]   Lot: [id number]   NDC: [address]-2

## 2019-06-20 NOTE — Progress Notes (Signed)
Office Visit Note  Patient: Emma Warner             Date of Birth: 1956/02/25           MRN: [id number]             PCP: Lizbeth Bark, NP Referring: Glenetta Borg Cathleen Corti, NP Visit Date: 06/26/2019 Occupation: @GUAROCC @  Subjective:  Medication monitoring   History of Present Illness: Emma Warner is a 64 y.o. female with history of seropositive rheumatoid arthritis and osteoarthritis.  She is on Enbrel 50 mg sq injections once weekly, Rasuvo 20 mg sq injections once weekly, and folic acid 2 mg po daily.  She denies any recent rheumatoid arthritis flares.  She denies any joint pain or joint swelling at this time.  She denies any difficulty with ADLs.  She has not had any morning stiffness.  She states that she continues to have chronic fatigue but does not feel that is a side effect of Rasuvo.  Patient reports she has had both COVID-19 vaccinations.  She has not had any recent infections.       Activities of Daily Living:  Patient reports morning stiffness for 0 minutes.   Patient Denies nocturnal pain.  Difficulty dressing/grooming: Denies Difficulty climbing stairs: Denies Difficulty getting out of chair: Denies Difficulty using hands for taps, buttons, cutlery, and/or writing: Denies  Review of Systems  Constitutional: Positive for fatigue.  HENT: Negative for mouth sores, mouth dryness and nose dryness.   Eyes: Negative for pain, itching, visual disturbance and dryness.  Respiratory: Negative for cough, hemoptysis, shortness of breath and difficulty breathing.   Cardiovascular: Negative for chest pain, palpitations, hypertension and swelling in legs/feet.  Gastrointestinal: Negative for blood in stool, constipation and diarrhea.  Endocrine: Negative for increased urination.  Genitourinary: Negative for difficulty urinating and painful urination.  Musculoskeletal: Positive for arthralgias and joint pain. Negative for joint swelling, myalgias, muscle weakness,  morning stiffness, muscle tenderness and myalgias.  Skin: Positive for hair loss. Negative for color change, pallor, rash, nodules/bumps, redness, skin tightness, ulcers and sensitivity to sunlight.  Allergic/Immunologic: Negative for susceptible to infections.  Neurological: Negative for dizziness, numbness, headaches, memory loss and weakness.  Hematological: Negative for bruising/bleeding tendency and swollen glands.  Psychiatric/Behavioral: Negative for depressed mood, confusion and sleep disturbance. The patient is not nervous/anxious.     PMFS History:  Patient Active Problem List   Diagnosis Date Noted  . Primary osteoarthritis of both hands 07/16/2016  . Rheumatoid arthritis involving multiple sites with positive rheumatoid factor (HCC) 07/15/2016  . ANA positive 07/15/2016  . History of gastroesophageal reflux (GERD) 07/15/2016  . Ds DNA antibody positive/ [facility] labs show patient does not have lupus 10/2016 07/15/2016  . Other fatigue 07/15/2016  . High risk medication use 07/15/2016  . Primary insomnia 07/15/2016  . Hypertension   . Hyperlipidemia   . GERD (gastroesophageal reflux disease)   . Prediabetes   . Vitamin D deficiency   . ALLERGIC RHINITIS 12/25/2007  . PLEURAL EFFUSION 12/25/2007    Past Medical History:  Diagnosis Date  . Arthritis   . GERD (gastroesophageal reflux disease)   . Hyperlipidemia   . Hypertension   . Prediabetes   . Vitamin D deficiency     Family History  Problem Relation Age of Onset  . Diabetes Mother   . Rheum arthritis Mother   . Cancer Father        colon  . Cirrhosis Brother  alcoholic  . Alcoholism Brother    Past Surgical History:  Procedure Laterality Date  . APPENDECTOMY     Social History   Social History Narrative  . Not on file   Immunization History  Administered Date(s) Administered  . Influenza,inj,Quad PF,6+ Mos 02/13/2016, 01/04/2017, 12/13/2017  . Influenza-Unspecified 12/22/2018  . PFIZER  SARS-COV-2 Vaccination 05/21/2019, 06/20/2019  . Td 03/15/2005  . Zoster 05/04/2016     Objective: Vital Signs: BP 135/70 (BP Location: Left Arm, Patient Position: Sitting, Cuff Size: Normal)   Pulse (!) 59   Resp 14   Ht 5' 7.5" (1.715 m)   Wt 184 lb 9.6 oz (83.7 kg)   BMI 28.49 kg/m    Physical Exam Vitals and nursing note reviewed.  Constitutional:      Appearance: She is well-developed.  HENT:     Head: Normocephalic and atraumatic.  Eyes:     Conjunctiva/sclera: Conjunctivae normal.  Pulmonary:     Effort: Pulmonary effort is normal.  Abdominal:     General: Bowel sounds are normal.     Palpations: Abdomen is soft.  Musculoskeletal:     Cervical back: Normal range of motion.  Lymphadenopathy:     Cervical: No cervical adenopathy.  Skin:    General: Skin is warm and dry.     Capillary Refill: Capillary refill takes less than 2 seconds.  Neurological:     Mental Status: She is alert and oriented to person, place, and time.  Psychiatric:        Behavior: Behavior normal.      Musculoskeletal Exam: C-spine, thoracic spine, lumbar spine good range of motion.  No midline spinal tenderness.  No SI joint tenderness.  Shoulder joints, with joints, strength, MCPs, PIPs, DIPs good range of motion no synovitis.  She has complete fist formation bilaterally.  Hip joints have good range of motion with no discomfort.  Knee joints have good range of motion with no warmth or effusion.  Ankle joints have good range of motion with no tenderness or inflammation.  No tenderness of MTP or PIP joints.  CDAI Exam: CDAI Score: 0.4  Patient Global: 2 mm; Provider Global: 2 mm Swollen: 0 ; Tender: 0  Joint Exam 06/26/2019   No joint exam has been documented for this visit   There is currently no information documented on the homunculus. Go to the Rheumatology activity and complete the homunculus joint exam.  Investigation: No additional findings.  Imaging: No results  found.  Recent Labs: Lab Results  Component Value Date   WBC 8.5 05/30/2019   HGB 13.0 05/30/2019   PLT 301 05/30/2019   NA 140 05/30/2019   K 4.0 05/30/2019   CL 104 05/30/2019   CO2 28 05/30/2019   GLUCOSE 96 05/30/2019   BUN 11 05/30/2019   CREATININE 0.79 05/30/2019   BILITOT 0.3 05/30/2019   ALKPHOS 74 10/14/2016   AST 20 05/30/2019   ALT 24 05/30/2019   PROT 6.9 05/30/2019   ALBUMIN 4.4 10/14/2016   CALCIUM 9.8 05/30/2019   GFRAA 92 05/30/2019   QFTBGOLDPLUS NEGATIVE 01/26/2019    Speciality Comments: No specialty comments available.  Procedures:  No procedures performed Allergies: Patient has no known allergies.   Assessment / Plan:     Visit Diagnoses: Rheumatoid arthritis involving multiple sites with positive rheumatoid factor ([place]): She has no synovitis on exam today.  She has not had any recent rheumatoid arthritis flares.  She is clinically doing well on Enbrel 50 mg subcutaneous  injections every week, Rasuvo 20 mg subcu injections every week, and folic acid 2 mg by mouth daily.  She has not had any morning stiffness or nocturnal pain.  She has no difficulty with ADLs.  She is not experiencing any joint pain or inflammation at this time.  She will continue on Enbrel and Rasuvo as prescribed.  She does not need any refills at this time.  She was advised to notify us if she develops increased joint pain or joint swelling.  She will follow-up in the office in 5 months.  High risk medication use - Enbrel Mini 50 mg/ml every 7 days, Rasuvo 20 mg every 7 days, and folic acid 1 mg 2 tablets daily.  Last TB gold negative 01/26/2019.  CBC and CMP are within normal limits on 05/30/2019.  She will return for lab work in June and every 3 months.  Standing orders for CBC and CMP are in place.  She has received both COVID-19 vaccinations.  She has not had any recent infections.  Primary osteoarthritis of both hands: She has no tenderness or inflammation at this time.  She has  complete fist formation bilaterally.  Joint protection and muscle strengthening were discussed.  Primary osteoarthritis of both feet: She is not having any discomfort in her feet at this time.  She has no tenderness or inflammation at this time.  She wears proper fitting shoes.  Trochanteric bursitis, right hip: Resolved.  She has no tenderness to palpation on exam.  Other fatigue: She continues to have chronic fatigue.  Her level of fatigue has been stable recently.  We discussed the importance of regular exercise and good sleep hygiene.  Primary insomnia: She has been sleeping well at night overall.  Ds DNA antibody positive/ [facility] labs show patient does not have lupus 10/2016: She has no clinical features of systemic lupus at this time.   History of osteoporosis -DEXA 03/13/2009 AP lumbar spine T score -3.1 with BMD of 0.707.  According to the patient she was previously taking Fosamax but discontinued due to side effects.  A future order for DEXA was placed today.  Plan: DG BONE DENSITY (DXA)  History of vitamin D deficiency - Future order for DEXA. Plan: DG BONE DENSITY (DXA)  Other medical conditions are listed as follows:  History of gastroesophageal reflux (GERD)  History of hyperlipidemia  History of hypertension  History of pleural effusion /2009  Prediabetes  Orders: Orders Placed This Encounter  Procedures  . DG BONE DENSITY (DXA)   No orders of the defined types were placed in this encounter.     Follow-Up Instructions: Return in about 5 months (around 11/26/2019) for Rheumatoid arthritis, Osteoarthritis.   Sherron Ales, PA-C  I examined and evaluated the patient with Sherron Ales PA.  Patient had no synovitis on examination.  Her symptoms are quite well controlled on current regimen.  We will schedule DEXA scan based on her age.  The plan of care was discussed as noted above.  Pollyann Savoy, MD  Note - This record has been created using Animal nutritionist.   Chart creation errors have been sought, but may not always  have been located. Such creation errors do not reflect on  the standard of medical care.

## 2019-06-26 ENCOUNTER — Ambulatory Visit: Payer: 59 | Admitting: Rheumatology

## 2019-06-26 ENCOUNTER — Encounter: Payer: Self-pay | Admitting: Rheumatology

## 2019-06-26 ENCOUNTER — Other Ambulatory Visit: Payer: Self-pay

## 2019-06-26 VITALS — BP 135/70 | HR 59 | Resp 14 | Ht 67.5 in | Wt 184.6 lb

## 2019-06-26 DIAGNOSIS — R7303 Prediabetes: Secondary | ICD-10-CM

## 2019-06-26 DIAGNOSIS — M19042 Primary osteoarthritis, left hand: Secondary | ICD-10-CM

## 2019-06-26 DIAGNOSIS — Z79899 Other long term (current) drug therapy: Secondary | ICD-10-CM

## 2019-06-26 DIAGNOSIS — R5383 Other fatigue: Secondary | ICD-10-CM | POA: Diagnosis not present

## 2019-06-26 DIAGNOSIS — M7061 Trochanteric bursitis, right hip: Secondary | ICD-10-CM | POA: Diagnosis not present

## 2019-06-26 DIAGNOSIS — M0579 Rheumatoid arthritis with rheumatoid factor of multiple sites without organ or systems involvement: Secondary | ICD-10-CM

## 2019-06-26 DIAGNOSIS — R768 Other specified abnormal immunological findings in serum: Secondary | ICD-10-CM | POA: Diagnosis not present

## 2019-06-26 DIAGNOSIS — Z8709 Personal history of other diseases of the respiratory system: Secondary | ICD-10-CM

## 2019-06-26 DIAGNOSIS — Z8739 Personal history of other diseases of the musculoskeletal system and connective tissue: Secondary | ICD-10-CM

## 2019-06-26 DIAGNOSIS — M19071 Primary osteoarthritis, right ankle and foot: Secondary | ICD-10-CM | POA: Diagnosis not present

## 2019-06-26 DIAGNOSIS — Z8719 Personal history of other diseases of the digestive system: Secondary | ICD-10-CM | POA: Diagnosis not present

## 2019-06-26 DIAGNOSIS — M19041 Primary osteoarthritis, right hand: Secondary | ICD-10-CM

## 2019-06-26 DIAGNOSIS — M19072 Primary osteoarthritis, left ankle and foot: Secondary | ICD-10-CM

## 2019-06-26 DIAGNOSIS — R69 Illness, unspecified: Secondary | ICD-10-CM | POA: Diagnosis not present

## 2019-06-26 DIAGNOSIS — Z8679 Personal history of other diseases of the circulatory system: Secondary | ICD-10-CM

## 2019-06-26 DIAGNOSIS — F5101 Primary insomnia: Secondary | ICD-10-CM

## 2019-06-26 DIAGNOSIS — Z8639 Personal history of other endocrine, nutritional and metabolic disease: Secondary | ICD-10-CM

## 2019-06-26 NOTE — Patient Instructions (Signed)
Standing Labs We placed an order today for your standing lab work.    Please come back and get your standing labs in June and every 3 months.   We have open lab daily Monday through Thursday from 8:30-12:30 PM and 1:30-4:30 PM and Friday from 8:30-12:30 PM and 1:30-4:00 PM at the office of Dr. Shaili Deveshwar.   You may experience shorter wait times on Monday and Friday afternoons. The office is located at [address], [address], [place], [place] [address] No appointment is necessary.   Labs are drawn by [facility].  You may receive a bill from [facility] for your lab work.  If you wish to have your labs drawn at another location, please call the office 24 hours in advance to send orders.  If you have any questions regarding directions or hours of operation,  please call [phone].   Just as a reminder please drink plenty of water prior to coming for your lab work. Thanks!  

## 2019-09-18 ENCOUNTER — Other Ambulatory Visit: Payer: Self-pay | Admitting: Rheumatology

## 2019-09-18 NOTE — Telephone Encounter (Signed)
Last Visit: 06/26/2019 Next Visit: 11/27/2019 Labs: 05/30/2019 CBC and CMP normal. TB Gold: 01/26/2019 Neg   Current Dose per office note on 06/26/2019: Enbrel Mini 50 mg/ml every 7 days DX: Rheumatoid arthritis   Attempted to contact the patient and left message to advise patient she is due for labs.   Okay to refill Enbrel Mini?

## 2019-11-13 NOTE — Progress Notes (Deleted)
Office Visit Note  Patient: Emma Warner             Date of Birth: [birth year]           MRN: [id number]             PCP: Lizbeth Bark, NP Referring: Smothers, Cathleen Corti, NP Visit Date: 11/27/2019 Occupation: @GUAROCC @  Subjective:  No chief complaint on file.   History of Present Illness: Emma Warner is a 64 y.o. female ***   Activities of Daily Living:  Patient reports morning stiffness for *** {minute/hour:19697}.   Patient {ACTIONS;DENIES/REPORTS:21021675::"Denies"} nocturnal pain.  Difficulty dressing/grooming: {ACTIONS;DENIES/REPORTS:21021675::"Denies"} Difficulty climbing stairs: {ACTIONS;DENIES/REPORTS:21021675::"Denies"} Difficulty getting out of chair: {ACTIONS;DENIES/REPORTS:21021675::"Denies"} Difficulty using hands for taps, buttons, cutlery, and/or writing: {ACTIONS;DENIES/REPORTS:21021675::"Denies"}  No Rheumatology ROS completed.   PMFS History:  Patient Active Problem List   Diagnosis Date Noted  . Primary osteoarthritis of both hands 07/16/2016  . Rheumatoid arthritis involving multiple sites with positive rheumatoid factor (HCC) 07/15/2016  . ANA positive 07/15/2016  . History of gastroesophageal reflux (GERD) 07/15/2016  . Ds DNA antibody positive/ [facility] labs show patient does not have lupus 10/2016 07/15/2016  . Other fatigue 07/15/2016  . High risk medication use 07/15/2016  . Primary insomnia 07/15/2016  . Hypertension   . Hyperlipidemia   . GERD (gastroesophageal reflux disease)   . Prediabetes   . Vitamin D deficiency   . ALLERGIC RHINITIS 12/25/2007  . PLEURAL EFFUSION 12/25/2007    Past Medical History:  Diagnosis Date  . Arthritis   . GERD (gastroesophageal reflux disease)   . Hyperlipidemia   . Hypertension   . Prediabetes   . Vitamin D deficiency     Family History  Problem Relation Age of Onset  . Diabetes Mother   . Rheum arthritis Mother   . Cancer Father        colon  . Cirrhosis Brother        alcoholic  .  Alcoholism Brother    Past Surgical History:  Procedure Laterality Date  . APPENDECTOMY     Social History   Social History Narrative  . Not on file   Immunization History  Administered Date(s) Administered  . Influenza,inj,Quad PF,6+ Mos 02/13/2016, 01/04/2017, 12/13/2017  . Influenza-Unspecified 12/22/2018  . PFIZER SARS-COV-2 Vaccination 05/21/2019, 06/20/2019  . Td 03/15/2005  . Zoster 05/04/2016     Objective: Vital Signs: There were no vitals taken for this visit.   Physical Exam   Musculoskeletal Exam: ***  CDAI Exam: CDAI Score: -- Patient Global: --; Provider Global: -- Swollen: --; Tender: -- Joint Exam 11/27/2019   No joint exam has been documented for this visit   There is currently no information documented on the homunculus. Go to the Rheumatology activity and complete the homunculus joint exam.  Investigation: No additional findings.  Imaging: No results found.  Recent Labs: Lab Results  Component Value Date   WBC 8.5 05/30/2019   HGB 13.0 05/30/2019   PLT 301 05/30/2019   NA 140 05/30/2019   K 4.0 05/30/2019   CL 104 05/30/2019   CO2 28 05/30/2019   GLUCOSE 96 05/30/2019   BUN 11 05/30/2019   CREATININE 0.79 05/30/2019   BILITOT 0.3 05/30/2019   ALKPHOS 74 10/14/2016   AST 20 05/30/2019   ALT 24 05/30/2019   PROT 6.9 05/30/2019   ALBUMIN 4.4 10/14/2016   CALCIUM 9.8 05/30/2019   GFRAA 92 05/30/2019   QFTBGOLDPLUS NEGATIVE 01/26/2019    Speciality Comments:  No specialty comments available.  Procedures:  No procedures performed Allergies: Patient has no known allergies.   Assessment / Plan:     Visit Diagnoses: No diagnosis found.  Orders: No orders of the defined types were placed in this encounter.  No orders of the defined types were placed in this encounter.   Face-to-face time spent with patient was *** minutes. Greater than 50% of time was spent in counseling and coordination of care.  Follow-Up Instructions: No  follow-ups on file.   Ellen Henri, CMA  Note - This record has been created using Animal nutritionist.  Chart creation errors have been sought, but may not always  have been located. Such creation errors do not reflect on  the standard of medical care.

## 2019-11-23 ENCOUNTER — Other Ambulatory Visit: Payer: Self-pay | Admitting: Rheumatology

## 2019-11-27 ENCOUNTER — Ambulatory Visit: Payer: 59 | Admitting: Rheumatology

## 2019-12-06 ENCOUNTER — Other Ambulatory Visit: Payer: Self-pay | Admitting: Rheumatology

## 2019-12-06 NOTE — Telephone Encounter (Signed)
Last Visit: 06/26/2019 Next Visit: 12/20/2019 Labs: 05/30/2019 CBC and CMP normal. TB Gold: 01/26/2019 negative   Current Dose per office note on 06/26/2019: Enbrel Mini 50 mg/ml every 7 days,  [id number] arthritis involving multiple sites with positive rheumatoid factor  Attempted to contact patient and left message on machine to advise patient she is overdue for labs and should update them asap. Provided patient with lab hours.

## 2019-12-06 NOTE — Progress Notes (Signed)
Office Visit Note  Patient: Emma Warner             Date of Birth: [birth year]           MRN: [id number]             PCP: Lizbeth Bark, NP Referring: Glenetta Borg Cathleen Corti, NP Visit Date: 12/20/2019 Occupation: @GUAROCC @  Subjective:  Medication management   History of Present Illness: ANABEL LYKINS is a 64 y.o. female with history of rheumatoid arthritis and osteoarthritis.  She states she continues to have some stiffness in her joints but no joint swelling.  She has been tolerating her medications well.  Her right trochanteric bursitis is better.  She has been vaccinated against COVID-19.  Activities of Daily Living:  Patient reports morning stiffness for 30 minutes.   Patient Denies nocturnal pain.  Difficulty dressing/grooming: Denies Difficulty climbing stairs: Denies Difficulty getting out of chair: Denies Difficulty using hands for taps, buttons, cutlery, and/or writing: Denies  Review of Systems  Constitutional: Positive for fatigue.  HENT: Negative for mouth dryness.   Eyes: Negative for dryness.  Respiratory: Negative for shortness of breath.   Cardiovascular: Negative for swelling in legs/feet.  Gastrointestinal: Negative for constipation.  Endocrine: Negative for excessive thirst.  Genitourinary: Negative for difficulty urinating.  Musculoskeletal: Positive for arthralgias, joint pain and morning stiffness.  Skin: Negative for rash.  Allergic/Immunologic: Negative for susceptible to infections.  Neurological: Negative for numbness.  Hematological: Positive for bruising/bleeding tendency.  Psychiatric/Behavioral: Positive for sleep disturbance.    PMFS History:  Patient Active Problem List   Diagnosis Date Noted  . Primary osteoarthritis of both hands 07/16/2016  . Rheumatoid arthritis involving multiple sites with positive rheumatoid factor (HCC) 07/15/2016  . ANA positive 07/15/2016  . History of gastroesophageal reflux (GERD) 07/15/2016  . Ds  DNA antibody positive/ [facility] labs show patient does not have lupus 10/2016 07/15/2016  . Other fatigue 07/15/2016  . High risk medication use 07/15/2016  . Primary insomnia 07/15/2016  . Hypertension   . Hyperlipidemia   . GERD (gastroesophageal reflux disease)   . Prediabetes   . Vitamin D deficiency   . ALLERGIC RHINITIS 12/25/2007  . PLEURAL EFFUSION 12/25/2007    Past Medical History:  Diagnosis Date  . Arthritis   . GERD (gastroesophageal reflux disease)   . Hyperlipidemia   . Hypertension   . Prediabetes   . Vitamin D deficiency     Family History  Problem Relation Age of Onset  . Diabetes Mother   . Rheum arthritis Mother   . Cancer Father        colon  . Cirrhosis Brother        alcoholic  . Alcoholism Brother    Past Surgical History:  Procedure Laterality Date  . APPENDECTOMY     Social History   Social History Narrative  . Not on file   Immunization History  Administered Date(s) Administered  . Influenza,inj,Quad PF,6+ Mos 02/13/2016, 01/04/2017, 12/13/2017  . Influenza-Unspecified 12/22/2018  . PFIZER SARS-COV-2 Vaccination 05/21/2019, 06/20/2019  . Td 03/15/2005  . Zoster 05/04/2016     Objective: Vital Signs: BP (!) 148/74 (BP Location: Left Arm, Patient Position: Sitting, Cuff Size: Normal)   Pulse 93   Resp 16   Ht 5' 7.5" (1.715 m)   Wt 186 lb 6.4 oz (84.6 kg)   BMI 28.76 kg/m    Physical Exam Vitals and nursing note reviewed.  Constitutional:      Appearance:  She is well-developed.  HENT:     Head: Normocephalic and atraumatic.  Eyes:     Conjunctiva/sclera: Conjunctivae normal.  Cardiovascular:     Rate and Rhythm: Normal rate and regular rhythm.     Heart sounds: Normal heart sounds.  Pulmonary:     Effort: Pulmonary effort is normal.     Breath sounds: Normal breath sounds.  Abdominal:     General: Bowel sounds are normal.     Palpations: Abdomen is soft.  Musculoskeletal:     Cervical back: Normal range of motion.    Lymphadenopathy:     Cervical: No cervical adenopathy.  Skin:    General: Skin is warm and dry.     Capillary Refill: Capillary refill takes less than 2 seconds.  Neurological:     Mental Status: She is alert and oriented to person, place, and time.  Psychiatric:        Behavior: Behavior normal.      Musculoskeletal Exam: C-spine, thoracic and lumbar spine were in good range of motion.  Shoulder joints, elbow joints, wrist joints, MCPs and PIPs with good range of motion.  She has some thickening of her left wrist joint.  No synovitis was noted.  PIP and DIP thickening was noted.  Hip joints, knee joints, ankles, MTPs and PIPs with good range of motion with no synovitis.  CDAI Exam: CDAI Score: 0.4  Patient Global: 2 mm; Provider Global: 2 mm Swollen: 0 ; Tender: 0  Joint Exam 12/20/2019   No joint exam has been documented for this visit   There is currently no information documented on the homunculus. Go to the Rheumatology activity and complete the homunculus joint exam.  Investigation: No additional findings.  Imaging: No results found.  Recent Labs: Lab Results  Component Value Date   WBC 8.8 12/14/2019   HGB 13.2 12/14/2019   PLT 323 12/14/2019   NA 141 12/14/2019   K 4.3 12/14/2019   CL 104 12/14/2019   CO2 26 12/14/2019   GLUCOSE 104 (H) 12/14/2019   BUN 13 12/14/2019   CREATININE 0.80 12/14/2019   BILITOT 0.3 12/14/2019   ALKPHOS 74 10/14/2016   AST 17 12/14/2019   ALT 24 12/14/2019   PROT 6.8 12/14/2019   ALBUMIN 4.4 10/14/2016   CALCIUM 9.6 12/14/2019   GFRAA 90 12/14/2019   QFTBGOLDPLUS NEGATIVE 01/26/2019    Speciality Comments: No specialty comments available.  Procedures:  No procedures performed Allergies: Patient has no known allergies.   Assessment / Plan:     Visit Diagnoses: Rheumatoid arthritis involving multiple sites with positive rheumatoid factor (HCC) -she had no synovitis on my examination today.  She has some stiffness.  She  has been tolerating medications well.  Plan: XR Foot 2 Views Right, XR Foot 2 Views Left, XR Hand 2 View Right, XR Hand 2 View Left.  The x-rays were consistent with rheumatoid arthritis and osteoarthritis overlap.  No radiographic progression was noted when compared to the x-rays of 2019.  X-ray findings were discussed with the patient.  High risk medication use - Enbrel Mini 50 mg/ml every 7 days, Rasuvo 20 mg every 7 days, and folic acid 1 mg 2 tablets daily.  Her labs are stable.  We will continue to monitor labs every 3 months.  She will need TB Gold next month.  Primary osteoarthritis of both hands -she has rheumatoid arthritis and osteoarthritis overlap with no synovitis.  Plan: XR Hand 2 View Right, XR Hand 2 View  Left  Primary osteoarthritis of both feet-she is currently not having much discomfort.  Trochanteric bursitis, right hip-she is off-and-on discomfort.  Stretching exercises were emphasized.  Other fatigue-improved.  Primary insomnia-good sleep hygiene was discussed.  Ds DNA antibody positive/ [facility] labs show patient does not have lupus 10/2016  History of osteoporosis - DEXA 03/13/2009 AP lumbar spine T score -3.1 with BMD of 0.707.  According to the patient she was previously taking Fosamax but discontinued due to side effects.  Patient states she will get DEXA once the pandemic subsides.  History of vitamin D deficiency  History of gastroesophageal reflux (GERD)  History of hyperlipidemia  History of hypertension  History of pleural effusion /2009  Prediabetes  Educated about COVID-19 virus infection-handouts was placed in the AVS per ACR guidelines.  Orders: Orders Placed This Encounter  Procedures  . XR Foot 2 Views Right  . XR Foot 2 Views Left  . XR Hand 2 View Right  . XR Hand 2 View Left   No orders of the defined types were placed in this encounter.     Follow-Up Instructions: Return in about 5 months (around 05/19/2020) for Rheumatoid arthritis,  Osteoarthritis.   Pollyann Savoy, MD  Note - This record has been created using Animal nutritionist.  Chart creation errors have been sought, but may not always  have been located. Such creation errors do not reflect on  the standard of medical care.

## 2019-12-06 NOTE — Telephone Encounter (Signed)
Last appointment was April 2021.  I do not see any labs since March 2021.

## 2019-12-14 ENCOUNTER — Other Ambulatory Visit: Payer: Self-pay

## 2019-12-14 DIAGNOSIS — Z79899 Other long term (current) drug therapy: Secondary | ICD-10-CM | POA: Diagnosis not present

## 2019-12-15 LAB — COMPLETE METABOLIC PANEL WITH GFR
AG Ratio: 2 (calc) (ref 1.0–2.5)
ALT: 24 U/L (ref 6–29)
AST: 17 U/L (ref 10–35)
Albumin: 4.5 g/dL (ref 3.6–5.1)
Alkaline phosphatase (APISO): 83 U/L (ref 37–153)
BUN: 13 mg/dL (ref 7–25)
CO2: 26 mmol/L (ref 20–32)
Calcium: 9.6 mg/dL (ref 8.6–10.4)
Chloride: 104 mmol/L (ref 98–110)
Creat: 0.8 mg/dL (ref 0.50–0.99)
GFR, Est African American: 90 mL/min/{1.73_m2} (ref >=60)
GFR, Est Non African American: 78 mL/min/{1.73_m2} (ref >=60)
Globulin: 2.3 g/dL (calc) (ref 1.9–3.7)
Glucose, Bld: 104 mg/dL — ABNORMAL HIGH (ref 65–99)
Potassium: 4.3 mmol/L (ref 3.5–5.3)
Sodium: 141 mmol/L (ref 135–146)
Total Bilirubin: 0.3 mg/dL (ref 0.2–1.2)
Total Protein: 6.8 g/dL (ref 6.1–8.1)

## 2019-12-15 LAB — CBC WITH DIFFERENTIAL/PLATELET
Absolute Monocytes: 722 cells/uL (ref 200–950)
Basophils Absolute: 53 cells/uL (ref 0–200)
Basophils Relative: 0.6 %
Eosinophils Absolute: 141 cells/uL (ref 15–500)
Eosinophils Relative: 1.6 %
HCT: 40.4 % (ref 35.0–45.0)
Hemoglobin: 13.2 g/dL (ref 11.7–15.5)
Lymphs Abs: 2728 cells/uL (ref 850–3900)
MCH: 29.1 pg (ref 27.0–33.0)
MCHC: 32.7 g/dL (ref 32.0–36.0)
MCV: 89.2 fL (ref 80.0–100.0)
MPV: 11.6 fL (ref 7.5–12.5)
Monocytes Relative: 8.2 %
Neutro Abs: 5157 cells/uL (ref 1500–7800)
Neutrophils Relative %: 58.6 %
Platelets: 323 10*3/uL (ref 140–400)
RBC: 4.53 10*6/uL (ref 3.80–5.10)
RDW: 13.2 % (ref 11.0–15.0)
Total Lymphocyte: 31 %
WBC: 8.8 10*3/uL (ref 3.8–10.8)

## 2019-12-15 NOTE — Progress Notes (Signed)
CBC is normal, CMP shows mildly elevated glucose. Probably not a fasting sample.

## 2019-12-20 ENCOUNTER — Encounter: Payer: Self-pay | Admitting: Rheumatology

## 2019-12-20 ENCOUNTER — Ambulatory Visit: Payer: Self-pay

## 2019-12-20 ENCOUNTER — Ambulatory Visit: Payer: 59 | Admitting: Rheumatology

## 2019-12-20 ENCOUNTER — Other Ambulatory Visit: Payer: Self-pay

## 2019-12-20 VITALS — BP 148/74 | HR 93 | Resp 16 | Ht 67.5 in | Wt 186.4 lb

## 2019-12-20 DIAGNOSIS — R7303 Prediabetes: Secondary | ICD-10-CM

## 2019-12-20 DIAGNOSIS — M19071 Primary osteoarthritis, right ankle and foot: Secondary | ICD-10-CM | POA: Diagnosis not present

## 2019-12-20 DIAGNOSIS — F5101 Primary insomnia: Secondary | ICD-10-CM

## 2019-12-20 DIAGNOSIS — Z8679 Personal history of other diseases of the circulatory system: Secondary | ICD-10-CM

## 2019-12-20 DIAGNOSIS — M0579 Rheumatoid arthritis with rheumatoid factor of multiple sites without organ or systems involvement: Secondary | ICD-10-CM

## 2019-12-20 DIAGNOSIS — R5383 Other fatigue: Secondary | ICD-10-CM

## 2019-12-20 DIAGNOSIS — Z8719 Personal history of other diseases of the digestive system: Secondary | ICD-10-CM

## 2019-12-20 DIAGNOSIS — R69 Illness, unspecified: Secondary | ICD-10-CM | POA: Diagnosis not present

## 2019-12-20 DIAGNOSIS — Z79899 Other long term (current) drug therapy: Secondary | ICD-10-CM | POA: Diagnosis not present

## 2019-12-20 DIAGNOSIS — M19042 Primary osteoarthritis, left hand: Secondary | ICD-10-CM

## 2019-12-20 DIAGNOSIS — Z8709 Personal history of other diseases of the respiratory system: Secondary | ICD-10-CM

## 2019-12-20 DIAGNOSIS — Z8639 Personal history of other endocrine, nutritional and metabolic disease: Secondary | ICD-10-CM | POA: Diagnosis not present

## 2019-12-20 DIAGNOSIS — Z8739 Personal history of other diseases of the musculoskeletal system and connective tissue: Secondary | ICD-10-CM

## 2019-12-20 DIAGNOSIS — M19072 Primary osteoarthritis, left ankle and foot: Secondary | ICD-10-CM

## 2019-12-20 DIAGNOSIS — R768 Other specified abnormal immunological findings in serum: Secondary | ICD-10-CM

## 2019-12-20 DIAGNOSIS — M7061 Trochanteric bursitis, right hip: Secondary | ICD-10-CM

## 2019-12-20 DIAGNOSIS — M19041 Primary osteoarthritis, right hand: Secondary | ICD-10-CM | POA: Diagnosis not present

## 2019-12-20 DIAGNOSIS — Z7189 Other specified counseling: Secondary | ICD-10-CM

## 2019-12-20 NOTE — Patient Instructions (Addendum)
COVID-19 vaccine recommendations:   COVID-19 vaccine is recommended for everyone (unless you are allergic to a vaccine component), even if you are on a medication that suppresses your immune system.   If you are on Methotrexate, Cellcept (mycophenolate), Rinvoq, Harriette Ohara, and Olumiant- hold the medication for 1 week after each vaccine. Hold Methotrexate for 2 weeks after the single dose COVID-19 vaccine.   If you are on Orencia subcutaneous injection - hold medication one week prior to and one week after the first COVID-19 vaccine dose (only).   If you are on Orencia IV infusions- time vaccination administration so that the first COVID-19 vaccination will occur four weeks after the infusion and postpone the subsequent infusion by one week.   If you are on Cyclophosphamide or Rituxan infusions please contact your doctor prior to receiving the COVID-19 vaccine.   Do not take Tylenol or any anti-inflammatory medications (NSAIDs) 24 hours prior to the COVID-19 vaccination.   There is no direct evidence about the efficacy of the COVID-19 vaccine in individuals who are on medications that suppress the immune system.   Even if you are fully vaccinated, and you are on any medications that suppress your immune system, please continue to wear a mask, maintain at least six feet social distance and practice hand hygiene.   If you develop a COVID-19 infection, please contact your PCP or our office to determine if you need antibody infusion.  The booster vaccine is now available for immunocompromised patients. It is advised that if you had Pfizer vaccine you should get [employer] booster.  If you had a Moderna vaccine then you should get a Moderna booster. Johnson and Laural Benes does not have a booster vaccine at this time.  Please see the following web sites for updated information.    [email or web address]   Standing Labs We placed an order today for your standing lab work.   Please have your standing labs drawn in January and every 3 months Please come in to get TB Gold in November.  If possible, please have your labs drawn 2 weeks prior to your appointment so that the provider can discuss your results at your appointment.  We have open lab daily Monday through Thursday from 8:30-12:30 PM and 1:30-4:30 PM and Friday from 8:30-12:30 PM and 1:30-4:00 PM at the office of Dr. Pollyann Savoy, [facility].   Please be advised, patients with office appointments requiring lab work will take precedents over walk-in lab work.  If possible, please come for your lab work on Monday and Friday afternoons, as you may experience shorter wait times. The office is located at [address], [place], [place] [address] No appointment is necessary.   Labs are drawn by [facility]. Please bring your co-pay at the time of your lab draw.  You may receive a bill from [facility] for your lab work.  If you wish to have your labs drawn at another location, please call the office 24 hours in advance to send orders.  If you have any questions regarding directions or hours of operation,  please call [phone].   As a reminder, please drink plenty of water prior to coming for your lab work. Thanks!

## 2019-12-21 ENCOUNTER — Other Ambulatory Visit: Payer: Self-pay | Admitting: Rheumatology

## 2019-12-21 NOTE — Telephone Encounter (Signed)
Last Visit: 12/20/2019 Next Visit: 05/20/2020 Labs: 12/14/2019 CBC is normal, CMP shows mildly elevated glucose TB Gold: 01/26/2019 Neg    Current Dose per office note 12/20/2019: - Enbrel Mini 50 mg/ml every 7 days, Rasuvo 20 mg every 7 days  HA:FBXUXYBFXO arthritis involving multiple sites with positive rheumatoid factor   Okay to refill per Dr. Corliss Skains

## 2019-12-24 ENCOUNTER — Other Ambulatory Visit: Payer: Self-pay | Admitting: Physician Assistant

## 2019-12-24 NOTE — Telephone Encounter (Signed)
Last Visit: 12/20/2019 Next Visit: 05/20/2020  Okay to refill per Dr. Corliss Skains

## 2019-12-28 NOTE — Telephone Encounter (Signed)
Received notification from [facility] regarding a prior authorization for RASUVO. Authorization has been APPROVED from 12/27/19 to 12/26/20.

## 2020-04-22 ENCOUNTER — Telehealth: Payer: Self-pay

## 2020-04-22 NOTE — Telephone Encounter (Signed)
Martie Lee from [facility] Prior Authorization department left a voicemail regarding PA for Enbrel.  We are calling to see if we can get verbal information to process the request.  We will send a criteria via fax to the fax number provided.  If you wish to give [place] a verbal response, please call #[phone], otherwise, please follow the criteria information and fax it back to:  #[phone]

## 2020-04-22 NOTE — Telephone Encounter (Signed)
Returned call.  Submitted a Prior Authorization request to [facility] for ENBREL via Telephone. Will update once we receive a response.   PA# [id number]

## 2020-04-24 NOTE — Telephone Encounter (Signed)
Received notification from [facility] regarding a prior authorization for ENBREL. Authorization has been APPROVED from 04/22/20 to 04/22/21.   Authorization # [id number] Phone # [phone]

## 2020-05-06 NOTE — Progress Notes (Signed)
Office Visit Note  Patient: Emma OremCynthia C Thede             Date of Birth: [birth year]           MRN: [id number]             PCP: Lizbeth BarkSmothers, Deborah N, NP Referring: Glenetta BorgSmothers, Cathleen Cortieborah N, NP Visit Date: 05/20/2020 Occupation: @GUAROCC @  Subjective:  Medication monitoring   History of Present Illness: Emma Warner is a 65 y.o. female with history of seropositive rheumatoid arthritis and osteoarthritis.  She is on enbrel 50 mg sq injections once weekly, rasuvo 20 mg sq injections once weekly, and folic acid 2 mg by mouth daily.  She has not missed any doses recently.  She denies any recent rheumatoid arthritis flares.  She denies any joint pain or joint swelling at this time.  Her morning stiffness has been lasting about 1 hour.  She continues to have intermittent fatigue.  She has been working from home throughout the pandemic.  Patient reports that she will be going on Medicare in September 2022 and is concerned that her Enbrel is no longer be covered. She denies any recent infections.  Activities of Daily Living:  Patient reports morning stiffness for 1 hour.   Patient Reports nocturnal pain.  Difficulty dressing/grooming: Denies Difficulty climbing stairs: Denies Difficulty getting out of chair: Denies Difficulty using hands for taps, buttons, cutlery, and/or writing: Denies  Review of Systems  Constitutional: Positive for fatigue.  HENT: Negative for mouth sores, mouth dryness and nose dryness.   Eyes: Negative for pain, itching, visual disturbance and dryness.  Respiratory: Negative for cough, hemoptysis, shortness of breath and difficulty breathing.   Cardiovascular: Negative for chest pain, palpitations and swelling in legs/feet.  Gastrointestinal: Negative for abdominal pain, blood in stool, constipation and diarrhea.  Endocrine: Negative for increased urination.  Genitourinary: Negative for painful urination.  Musculoskeletal: Positive for arthralgias, joint pain and morning  stiffness. Negative for joint swelling, myalgias, muscle weakness, muscle tenderness and myalgias.  Skin: Negative for color change, rash and redness.  Allergic/Immunologic: Negative for susceptible to infections.  Neurological: Negative for dizziness, numbness, headaches, memory loss and weakness.  Hematological: Negative for swollen glands.  Psychiatric/Behavioral: Negative for confusion and sleep disturbance.    PMFS History:  Patient Active Problem List   Diagnosis Date Noted  . Primary osteoarthritis of both hands 07/16/2016  . Rheumatoid arthritis involving multiple sites with positive rheumatoid factor (HCC) 07/15/2016  . ANA positive 07/15/2016  . History of gastroesophageal reflux (GERD) 07/15/2016  . Ds DNA antibody positive/ [facility] labs show patient does not have lupus 10/2016 07/15/2016  . Other fatigue 07/15/2016  . High risk medication use 07/15/2016  . Primary insomnia 07/15/2016  . Hypertension   . Hyperlipidemia   . GERD (gastroesophageal reflux disease)   . Prediabetes   . Vitamin D deficiency   . ALLERGIC RHINITIS 12/25/2007  . PLEURAL EFFUSION 12/25/2007    Past Medical History:  Diagnosis Date  . Arthritis   . GERD (gastroesophageal reflux disease)   . Hyperlipidemia   . Hypertension   . Prediabetes   . Vitamin D deficiency     Family History  Problem Relation Age of Onset  . Diabetes Mother   . Rheum arthritis Mother   . Cancer Father        colon  . Cirrhosis Brother        alcoholic  . Alcoholism Brother    Past Surgical History:  Procedure Laterality  Date  . APPENDECTOMY     Social History   Social History Narrative  . Not on file   Immunization History  Administered Date(s) Administered  . Influenza,inj,Quad PF,6+ Mos 02/13/2016, 01/04/2017, 12/13/2017  . Influenza-Unspecified 12/22/2018  . PFIZER(Purple Top)SARS-COV-2 Vaccination 05/21/2019, 06/20/2019, 12/28/2019  . Td 03/15/2005  . Zoster 05/04/2016     Objective: Vital  Signs: BP (!) 141/78 (BP Location: Left Arm, Patient Position: Sitting, Cuff Size: Normal)   Pulse (!) 118   Ht 5' 7.5" (1.715 m)   Wt 192 lb (87.1 kg)   BMI 29.63 kg/m    Physical Exam Vitals and nursing note reviewed.  Constitutional:      Appearance: She is well-developed and well-nourished.  HENT:     Head: Normocephalic and atraumatic.  Eyes:     Extraocular Movements: EOM normal.     Conjunctiva/sclera: Conjunctivae normal.  Cardiovascular:     Pulses: Intact distal pulses.  Pulmonary:     Effort: Pulmonary effort is normal.  Abdominal:     Palpations: Abdomen is soft.  Musculoskeletal:     Cervical back: Normal range of motion.  Skin:    General: Skin is warm and dry.     Capillary Refill: Capillary refill takes less than 2 seconds.  Neurological:     Mental Status: She is alert and oriented to person, place, and time.  Psychiatric:        Mood and Affect: Mood and affect normal.        Behavior: Behavior normal.      Musculoskeletal Exam: C-spine, thoracic spine, lumbar spine have good range of motion with no discomfort.  No midline spinal tenderness or SI joint tenderness.  Shoulder joints, elbow joints, wrist joints, MCPs, PIPs, DIPs have good range of motion with no synovitis. Mild PIP and DIP thickening consistent with osteoarthritis of both hands.  She was able to make a complete fist bilaterally.  Hip joints have good range of motion with no discomfort.  No tenderness over trochanteric bursa bilaterally.  Knee joints have good range of motion with no warmth or effusion.  Ankle joints have good range of motion with no tenderness or inflammation.  No tenderness of MTP joints. Mild osteoarthritic changes noted in both.   CDAI Exam: CDAI Score: 0.5  Patient Global: 3 mm; Provider Global: 2 mm Swollen: 0 ; Tender: 0  Joint Exam 05/20/2020   No joint exam has been documented for this visit   There is currently no information documented on the homunculus. Go to the  Rheumatology activity and complete the homunculus joint exam.  Investigation: No additional findings.  Imaging: No results found.  Recent Labs: Lab Results  Component Value Date   WBC 9.1 05/16/2020   HGB 12.3 05/16/2020   PLT 307 05/16/2020   NA 141 05/16/2020   K 4.2 05/16/2020   CL 106 05/16/2020   CO2 25 05/16/2020   GLUCOSE 99 05/16/2020   BUN 15 05/16/2020   CREATININE 0.82 05/16/2020   BILITOT 0.3 05/16/2020   ALKPHOS 74 10/14/2016   AST 18 05/16/2020   ALT 26 05/16/2020   PROT 6.9 05/16/2020   ALBUMIN 4.4 10/14/2016   CALCIUM 9.5 05/16/2020   GFRAA 88 05/16/2020   QFTBGOLDPLUS NEGATIVE 05/16/2020    Speciality Comments: No specialty comments available.  Procedures:  No procedures performed Allergies: Patient has no known allergies.     Assessment / Plan:     Visit Diagnoses: Rheumatoid arthritis involving multiple sites with positive  rheumatoid factor (HCC): She has no joint tenderness or synovitis on exam.  She has not had any recent rheumatoid arthritis flares.  She has clinically been doing well on Enbrel 50 mg subcutaneous injections once weekly, Rasuvo 20 mg subcutaneous injections once weekly, and folic acid 2 mg by mouth daily.  She has not missed any doses of Enbrel or Rasuvo recently.  She has not experienced any joint pain or inflammation currently.  Her morning stiffness has been lasting about 1 hour daily.  She has not had any difficulty with ADLs.  She will continue on the current treatment regimen.  She was advised to notify us if she develops increased joint pain or joint swelling.  She will follow up in 5 months.   High risk medication use - Enbrel Mini 50 mg/ml every 7 days, Rasuvo 20 mg every 7 days, and folic acid 1 mg 2 tablets daily. CBC and CMP WNL on 05/16/20.  She will be due to update lab work in June and every 3 months to monitor for drug toxicity.  Standing orders for CBC and CMP remain in place.  TB gold negative on 05/16/20 and will continue  to be monitored yearly.   She has not had any recent infections.  She was advised to hold Enbrel and Rasuvo if she develops signs or symptoms of an infection and to resume once the infection has completely cleared. She has received 3 Pfizer COVID-19 vaccine doses.  We discussed that the current recommendation by ACR is to receive the fourth vaccine dose 5 to 6 months after her third dose.  She plans on getting the fourth vaccine in April 2022.  She was advised to hold Rasuvo for 1 week after receiving the vaccine.  She was also advised to avoid taking Tylenol and NSAIDs 24 hours prior to the vaccine. We discussed the importance of yearly skin exams while on Enbrel.  She was encouraged to establish care with a dermatologist for further evaluation.  Primary osteoarthritis of both hands: Mild PIP and DIP thickening consistent with osteoarthritis of both hands.  No joint tenderness or inflammation was noted.  She was able to make a complete fist bilaterally.  Joint protection and muscle strengthening were discussed.  Primary osteoarthritis of both feet: Mild PIP and DIP thickening consistent with osteoarthritis of both feet noted.  No joint tenderness or inflammation noted.  No tenderness over MTP joints.  Ankle joints have good range of motion with no tenderness or inflammation.  Trochanteric bursitis, right hip: Resolved.  No tenderness palpation.   Other fatigue: She continues to experience intermittent fatigue.   Primary insomnia: She experiences occasional nocturnal pain in her legs.  We discussed the importance of good sleep hygiene.  Ds DNA antibody positive/ [facility] labs show patient does not have lupus 10/2016.  She has no clinical features of systemic lupus.   History of osteoporosis - DEXA 03/13/2009 AP lumbar spine T score -3.1 with BMD of 0.707.  D/c fosamax due to SE. she did not get her bone density updated due to the concern for COVID-19 during the pandemic.  A new order for DEXA was placed  today.  She was encouraged to continue to take a calcium and vitamin D supplement on a daily basis.  She has not had any recent falls or fractures.  History of vitamin D deficiency: She continues to take a calcium and vitamin D supplement on a daily basis.  Other medical conditions are listed as follows:  History  of gastroesophageal reflux (GERD)  Prediabetes  History of hyperlipidemia  History of hypertension  History of pleural effusion /2009  Orders: Orders Placed This Encounter  Procedures  . DG BONE DENSITY (DXA)   No orders of the defined types were placed in this encounter.    Follow-Up Instructions: Return in about 5 months (around 10/20/2020) for Rheumatoid arthritis, Osteoarthritis.   Gearldine Bienenstock, PA-C  Note - This record has been created using Dragon software.  Chart creation errors have been sought, but may not always  have been located. Such creation errors do not reflect on  the standard of medical care. \

## 2020-05-16 ENCOUNTER — Other Ambulatory Visit: Payer: Self-pay | Admitting: *Deleted

## 2020-05-16 DIAGNOSIS — Z9225 Personal history of immunosupression therapy: Secondary | ICD-10-CM

## 2020-05-16 DIAGNOSIS — Z111 Encounter for screening for respiratory tuberculosis: Secondary | ICD-10-CM | POA: Diagnosis not present

## 2020-05-16 DIAGNOSIS — Z79899 Other long term (current) drug therapy: Secondary | ICD-10-CM

## 2020-05-17 NOTE — Progress Notes (Signed)
CBC and CMP are normal.

## 2020-05-18 LAB — COMPLETE METABOLIC PANEL WITH GFR
AG Ratio: 1.7 (calc) (ref 1.0–2.5)
ALT: 26 U/L (ref 6–29)
AST: 18 U/L (ref 10–35)
Albumin: 4.3 g/dL (ref 3.6–5.1)
Alkaline phosphatase (APISO): 90 U/L (ref 37–153)
BUN: 15 mg/dL (ref 7–25)
CO2: 25 mmol/L (ref 20–32)
Calcium: 9.5 mg/dL (ref 8.6–10.4)
Chloride: 106 mmol/L (ref 98–110)
Creat: 0.82 mg/dL (ref 0.50–0.99)
GFR, Est African American: 88 mL/min/{1.73_m2} (ref >=60)
GFR, Est Non African American: 76 mL/min/{1.73_m2} (ref >=60)
Globulin: 2.6 g/dL (calc) (ref 1.9–3.7)
Glucose, Bld: 99 mg/dL (ref 65–99)
Potassium: 4.2 mmol/L (ref 3.5–5.3)
Sodium: 141 mmol/L (ref 135–146)
Total Bilirubin: 0.3 mg/dL (ref 0.2–1.2)
Total Protein: 6.9 g/dL (ref 6.1–8.1)

## 2020-05-18 LAB — CBC WITH DIFFERENTIAL/PLATELET
Absolute Monocytes: 855 cells/uL (ref 200–950)
Basophils Absolute: 36 cells/uL (ref 0–200)
Basophils Relative: 0.4 %
Eosinophils Absolute: 155 cells/uL (ref 15–500)
Eosinophils Relative: 1.7 %
HCT: 36.2 % (ref 35.0–45.0)
Hemoglobin: 12.3 g/dL (ref 11.7–15.5)
Lymphs Abs: 1756 cells/uL (ref 850–3900)
MCH: 30 pg (ref 27.0–33.0)
MCHC: 34 g/dL (ref 32.0–36.0)
MCV: 88.3 fL (ref 80.0–100.0)
MPV: 10.9 fL (ref 7.5–12.5)
Monocytes Relative: 9.4 %
Neutro Abs: 6297 cells/uL (ref 1500–7800)
Neutrophils Relative %: 69.2 %
Platelets: 307 10*3/uL (ref 140–400)
RBC: 4.1 10*6/uL (ref 3.80–5.10)
RDW: 13.8 % (ref 11.0–15.0)
Total Lymphocyte: 19.3 %
WBC: 9.1 10*3/uL (ref 3.8–10.8)

## 2020-05-18 LAB — QUANTIFERON-TB GOLD PLUS
Mitogen-NIL: >10 IU/mL
NIL: 0.09 IU/mL
QuantiFERON-TB Gold Plus: NEGATIVE
TB1-NIL: <0 IU/mL
TB2-NIL: 0 IU/mL

## 2020-05-18 NOTE — Progress Notes (Signed)
CBC, and CMP are normal.  TB gold is negative.

## 2020-05-20 ENCOUNTER — Ambulatory Visit: Payer: 59 | Admitting: Physician Assistant

## 2020-05-20 ENCOUNTER — Encounter: Payer: Self-pay | Admitting: Physician Assistant

## 2020-05-20 ENCOUNTER — Other Ambulatory Visit: Payer: Self-pay

## 2020-05-20 VITALS — BP 141/78 | HR 118 | Ht 67.5 in | Wt 192.0 lb

## 2020-05-20 DIAGNOSIS — F5101 Primary insomnia: Secondary | ICD-10-CM

## 2020-05-20 DIAGNOSIS — Z8739 Personal history of other diseases of the musculoskeletal system and connective tissue: Secondary | ICD-10-CM | POA: Diagnosis not present

## 2020-05-20 DIAGNOSIS — M19072 Primary osteoarthritis, left ankle and foot: Secondary | ICD-10-CM

## 2020-05-20 DIAGNOSIS — M19071 Primary osteoarthritis, right ankle and foot: Secondary | ICD-10-CM

## 2020-05-20 DIAGNOSIS — M19042 Primary osteoarthritis, left hand: Secondary | ICD-10-CM

## 2020-05-20 DIAGNOSIS — Z79899 Other long term (current) drug therapy: Secondary | ICD-10-CM | POA: Diagnosis not present

## 2020-05-20 DIAGNOSIS — M19041 Primary osteoarthritis, right hand: Secondary | ICD-10-CM | POA: Diagnosis not present

## 2020-05-20 DIAGNOSIS — M7061 Trochanteric bursitis, right hip: Secondary | ICD-10-CM

## 2020-05-20 DIAGNOSIS — R69 Illness, unspecified: Secondary | ICD-10-CM | POA: Diagnosis not present

## 2020-05-20 DIAGNOSIS — R5383 Other fatigue: Secondary | ICD-10-CM

## 2020-05-20 DIAGNOSIS — M0579 Rheumatoid arthritis with rheumatoid factor of multiple sites without organ or systems involvement: Secondary | ICD-10-CM

## 2020-05-20 DIAGNOSIS — Z8679 Personal history of other diseases of the circulatory system: Secondary | ICD-10-CM

## 2020-05-20 DIAGNOSIS — Z8719 Personal history of other diseases of the digestive system: Secondary | ICD-10-CM

## 2020-05-20 DIAGNOSIS — Z8709 Personal history of other diseases of the respiratory system: Secondary | ICD-10-CM

## 2020-05-20 DIAGNOSIS — R7303 Prediabetes: Secondary | ICD-10-CM

## 2020-05-20 DIAGNOSIS — R768 Other specified abnormal immunological findings in serum: Secondary | ICD-10-CM

## 2020-05-20 DIAGNOSIS — Z8639 Personal history of other endocrine, nutritional and metabolic disease: Secondary | ICD-10-CM

## 2020-05-20 NOTE — Patient Instructions (Signed)
Standing Labs We placed an order today for your standing lab work.   Please have your standing labs drawn in June and every 3 months   If possible, please have your labs drawn 2 weeks prior to your appointment so that the provider can discuss your results at your appointment.  We have open lab daily Monday through Thursday from 1:30-4:30 PM and Friday from 1:30-4:00 PM at the office of Dr. Shaili Deveshwar,  Rheumatology.   Please be advised, all patients with office appointments requiring lab work will take precedents over walk-in lab work.  If possible, please come for your lab work on Monday and Friday afternoons, as you may experience shorter wait times. The office is located at [address], [address], [place], [place] [address] No appointment is necessary.   Labs are drawn by [facility]. Please bring your co-pay at the time of your lab draw.  You may receive a bill from [facility] for your lab work.  If you wish to have your labs drawn at another location, please call the office 24 hours in advance to send orders.  If you have any questions regarding directions or hours of operation,  please call [phone].   As a reminder, please drink plenty of water prior to coming for your lab work. Thanks!   

## 2020-05-28 ENCOUNTER — Other Ambulatory Visit: Payer: Self-pay | Admitting: Rheumatology

## 2020-05-28 NOTE — Telephone Encounter (Signed)
Next Visit: 10/21/2020  Last Visit: 05/20/2020  Last Fill: 12/21/2019  DX:  Rheumatoid arthritis involving multiple sites with positive rheumatoid factor   Current Dose per office note 05/20/2020, Rasuvo 20 mg every 7 days  Labs: 05/16/2020, CBC and CMP are normal  Okay to refill Rasuvo?

## 2020-06-19 ENCOUNTER — Telehealth: Payer: Self-pay | Admitting: Pharmacy Technician

## 2020-06-19 DIAGNOSIS — M0579 Rheumatoid arthritis with rheumatoid factor of multiple sites without organ or systems involvement: Secondary | ICD-10-CM

## 2020-06-19 NOTE — Telephone Encounter (Signed)
Received faxes that patient has new insurance and will need new PA's for Rasuvo and Enbrel.

## 2020-06-20 MED ORDER — RASUVO 20 MG/0.4ML ~~LOC~~ SOAJ: SUBCUTANEOUS | 0 refills | Status: DC

## 2020-06-20 MED ORDER — ENBREL MINI 50 MG/ML ~~LOC~~ SOCT: SUBCUTANEOUS | 1 refills | Status: DC

## 2020-06-20 NOTE — Addendum Note (Signed)
Addended by: Murrell Redden on: 06/20/2020 09:25 AM   Modules accepted: Orders

## 2020-06-20 NOTE — Telephone Encounter (Signed)
Received notification from [facility] regarding a prior authorization for ENBREL. Authorization has been APPROVED from 06/20/20  to 06/20/21.   Authorization # [id number]   Received notification from [facility] regarding a prior authorization for RASUVO. Authorization has been APPROVED from 06/20/20 to 06/20/21.   Authorization # [id number]   Per plan, patient must fill through [facility]. Please send in prescriptions for patient.

## 2020-06-20 NOTE — Telephone Encounter (Addendum)
Refill for Enbrel Mini 50mg  once weekly and Rasuvo 20mg  weekly sent to [facility] for 104-month supply. Patient is due for labs in June 2022.  She has been in touch with Medicare about transitioning to [facility] plan in September. Advised that at that point she'd likely have to pursue patient assistance for both Enbrel and Rasuvo. I advised her to reach out to Enbrel support for advice on transitioning to Medicare.  CBC    Component Value Date/Time   WBC 9.1 05/16/2020 1441   RBC 4.10 05/16/2020 1441   HGB 12.3 05/16/2020 1441   HCT 36.2 05/16/2020 1441   PLT 307 05/16/2020 1441   MCV 88.3 05/16/2020 1441   MCH 30.0 05/16/2020 1441   MCHC 34.0 05/16/2020 1441   RDW 13.8 05/16/2020 1441   LYMPHSABS 1,756 05/16/2020 1441   MONOABS 648 10/14/2016 1210   EOSABS 155 05/16/2020 1441   BASOSABS 36 05/16/2020 1441   CMP     Component Value Date/Time   NA 141 05/16/2020 1441   K 4.2 05/16/2020 1441   CL 106 05/16/2020 1441   CO2 25 05/16/2020 1441   GLUCOSE 99 05/16/2020 1441   BUN 15 05/16/2020 1441   CREATININE 0.82 05/16/2020 1441   CALCIUM 9.5 05/16/2020 1441   PROT 6.9 05/16/2020 1441   ALBUMIN 4.4 10/14/2016 1121   AST 18 05/16/2020 1441   ALT 26 05/16/2020 1441   ALKPHOS 74 10/14/2016 1121   BILITOT 0.3 05/16/2020 1441   GFRNONAA 76 05/16/2020 1441   GFRAA 88 05/16/2020 1441   Quantiferon TB Gold Latest Ref Rng & Units 05/16/2020  Quantiferon TB Gold Plus NEGATIVE NEGATIVE     07/16/2020, PharmD, MPH Clinical Pharmacist (Rheumatology and Pulmonology)

## 2020-07-30 ENCOUNTER — Other Ambulatory Visit: Payer: Self-pay | Admitting: Physician Assistant

## 2020-07-30 DIAGNOSIS — M0579 Rheumatoid arthritis with rheumatoid factor of multiple sites without organ or systems involvement: Secondary | ICD-10-CM

## 2020-07-30 NOTE — Telephone Encounter (Signed)
Next Visit: 10/21/2020  Last Visit: 05/20/2020  Last Fill: 06/20/2020  DX: Rheumatoid arthritis involving multiple sites with positive rheumatoid factor   Current Dose per office note 05/20/2020, Enbrel Mini 50 mg/ml every 7 days  Labs: 05/16/2020, CBC and CMP are normal.  TB Gold: 05/16/2020, negative  Okay to refill Enbrel?

## 2020-10-07 NOTE — Progress Notes (Signed)
Office Visit Note  Patient: Emma Warner             Date of Birth: [birth year]           MRN: [id number]             PCP: Lizbeth Bark, NP Referring: Glenetta Borg Cathleen Corti, NP Visit Date: 10/21/2020 Occupation: @GUAROCC @  Subjective:  Medication management.   History of Present Illness: Emma Warner is a 65 y.o. female with a history of rheumatoid arthritis, osteoarthritis and osteoporosis.  She denies any increased joint pain or joint swelling.  She continues to have some intermittent discomfort in her joints she describes in her hands and her feet.  She has been tolerating Enbrel and Rasuvo without any side effects.  She states she was recently taking some ibuprofen at bedtime due to feet discomfort.  She stopped the medications once we called her and advised her that her LFTs were elevated.  Activities of Daily Living:  Patient reports morning stiffness for 5 minutes.   Patient Reports nocturnal pain.  Difficulty dressing/grooming: Denies Difficulty climbing stairs: Denies Difficulty getting out of chair: Denies Difficulty using hands for taps, buttons, cutlery, and/or writing: Denies  Review of Systems  Constitutional:  Positive for fatigue.  HENT:  Negative for mouth sores, mouth dryness and nose dryness.   Eyes:  Negative for pain, itching and dryness.  Respiratory:  Negative for shortness of breath and difficulty breathing.   Cardiovascular:  Negative for chest pain and palpitations.  Gastrointestinal:  Negative for blood in stool, constipation and diarrhea.  Endocrine: Negative for increased urination.  Genitourinary:  Negative for difficulty urinating.  Musculoskeletal:  Positive for joint pain, joint pain, myalgias, morning stiffness and myalgias. Negative for joint swelling and muscle tenderness.  Skin:  Negative for color change, rash and redness.  Allergic/Immunologic: Negative for susceptible to infections.  Neurological:  Negative for dizziness,  numbness, headaches and memory loss.  Hematological:  Negative for bruising/bleeding tendency.  Psychiatric/Behavioral:  Negative for confusion.    PMFS History:  Patient Active Problem List   Diagnosis Date Noted   Primary osteoarthritis of both hands 07/16/2016   Rheumatoid arthritis involving multiple sites with positive rheumatoid factor (HCC) 07/15/2016   ANA positive 07/15/2016   History of gastroesophageal reflux (GERD) 07/15/2016   Ds DNA antibody positive/ [facility] labs show patient does not have lupus 10/2016 07/15/2016   Other fatigue 07/15/2016   High risk medication use 07/15/2016   Primary insomnia 07/15/2016   Hypertension    Hyperlipidemia    GERD (gastroesophageal reflux disease)    Prediabetes    Vitamin D deficiency    ALLERGIC RHINITIS 12/25/2007   PLEURAL EFFUSION 12/25/2007    Past Medical History:  Diagnosis Date   Arthritis    GERD (gastroesophageal reflux disease)    Hyperlipidemia    Hypertension    Prediabetes    Vitamin D deficiency     Family History  Problem Relation Age of Onset   Diabetes Mother    Rheum arthritis Mother    Cancer Father        colon   Cirrhosis Brother        alcoholic   Alcoholism Brother    Past Surgical History:  Procedure Laterality Date   APPENDECTOMY     Social History   Social History Narrative   Not on file   Immunization History  Administered Date(s) Administered   Influenza,inj,Quad PF,6+ Mos 02/13/2016, 01/04/2017, 12/13/2017  Influenza-Unspecified 12/22/2018   PFIZER(Purple Top)SARS-COV-2 Vaccination 05/21/2019, 06/20/2019, 12/28/2019   Td 03/15/2005   Zoster, Live 05/04/2016     Objective: Vital Signs: BP (!) 152/81 (BP Location: Left Arm, Patient Position: Sitting, Cuff Size: Normal)   Pulse (!) 118   Ht 5' 7.5" (1.715 m)   Wt 198 lb 12.8 oz (90.2 kg)   BMI 30.68 kg/m    Physical Exam Vitals and nursing note reviewed.  Constitutional:      Appearance: She is well-developed.  HENT:      Head: Normocephalic and atraumatic.  Eyes:     Conjunctiva/sclera: Conjunctivae normal.  Cardiovascular:     Rate and Rhythm: Normal rate and regular rhythm.     Heart sounds: Normal heart sounds.  Pulmonary:     Effort: Pulmonary effort is normal.     Breath sounds: Normal breath sounds.  Abdominal:     General: Bowel sounds are normal.     Palpations: Abdomen is soft.  Musculoskeletal:     Cervical back: Normal range of motion.  Lymphadenopathy:     Cervical: No cervical adenopathy.  Skin:    General: Skin is warm and dry.     Capillary Refill: Capillary refill takes less than 2 seconds.  Neurological:     Mental Status: She is alert and oriented to person, place, and time.  Psychiatric:        Behavior: Behavior normal.     Musculoskeletal Exam: C-spine was in good range of motion.  Shoulder joints, elbow joints, wrist joints with good range of motion.  She has PIP and DIP thickening with no synovitis.  Hip joints and knee joints in good range of motion.  She had no tenderness over ankles or MTPs.  CDAI Exam: CDAI Score: 0.6  Patient Global: 4 mm; Provider Global: 2 mm Swollen: 0 ; Tender: 0  Joint Exam 10/21/2020   No joint exam has been documented for this visit   There is currently no information documented on the homunculus. Go to the Rheumatology activity and complete the homunculus joint exam.  Investigation: No additional findings.  Imaging: No results found.  Recent Labs: Lab Results  Component Value Date   WBC 9.5 10/17/2020   HGB 12.7 10/17/2020   PLT 333 10/17/2020   NA 139 10/17/2020   K 3.9 10/17/2020   CL 103 10/17/2020   CO2 26 10/17/2020   GLUCOSE 101 (H) 10/17/2020   BUN 18 10/17/2020   CREATININE 0.76 10/17/2020   BILITOT 0.3 10/17/2020   ALKPHOS 74 10/14/2016   AST 24 10/17/2020   ALT 32 (H) 10/17/2020   PROT 7.2 10/17/2020   ALBUMIN 4.4 10/14/2016   CALCIUM 9.4 10/17/2020   GFRAA 88 05/16/2020   QFTBGOLDPLUS NEGATIVE 05/16/2020     Speciality Comments: No specialty comments available.  Procedures:  No procedures performed Allergies: Patient has no known allergies.   Assessment / Plan:     Visit Diagnoses: Rheumatoid arthritis involving multiple sites with positive rheumatoid factor (HCC)-her rheumatoid arthritis appears to be well controlled.  She gives history of intermittent pain and swelling in her hands and feet.  No synovitis was noted today.  She will continue current regimen.  High risk medication use - Enbrel Mini 50 mg/ml every 7 days, Rasuvo 20 mg every 7 days, and folic acid 1 mg 2 tablets daily.  Her labs in July showed mildly elevated AST.  She states she was taking ibuprofen.  She discontinued the use of ibuprofen.  We  will continue to monitor labs every 3 months.  She was advised to stop Enbrel and methotrexate in case she develops an infection and resume medications once the infection resolves.  She recently received COVID-19 booster.  Information regarding immunization was also placed in the AVS.  Annual skin examination to monitor for nonmelanoma skin cancer was advised.  Primary osteoarthritis of both hands-joint protection muscle strengthening was discussed.  Trochanteric bursitis, right hip - Resolved.   Primary osteoarthritis of both feet-she has off-and-on discomfort.  Proper fitting shoes were discussed.  Other fatigue  Primary insomnia-sleep hygiene was discussed.  Ds DNA antibody positive/ [facility] labs show patient does not have lupus 10/2016 - She has no clinical features of systemic lupus.  History of osteoporosis - DEXA 03/13/2009 AP lumbar spine T score -3.1 with BMD of 0.707.  D/c fosamax due to SE -I had detailed discussion with patient regarding medication options.  She states she got concerned about the side effects of Fosamax and discontinued Fosamax several years ago.  She is willing to try it again.  Indications side effects contraindications were discussed at length.  I advised  her to schedule another bone density through her PCP.  We will also obtain following labs with her next blood work.  Once we have all the results available we can start her on Fosamax.  A handout on Fosamax was given.  Plan: Parathyroid hormone, intact (no Ca), TSH, Serum protein electrophoresis with reflex  History of vitamin D deficiency -she has history of vitamin D deficiency.  She is not taking vitamin D currently.  Plan: VITAMIN D 25 Hydroxy (Vit-D Deficiency, Fractures)  History of gastroesophageal reflux (GERD)-she takes Nexium for reflux.  If she has intolerance to Fosamax then we may switch her to IV Reclast.  History of hypertension-blood pressure was mildly elevated today.  She was advised to monitor blood pressure closely.  History of hyperlipidemia-increased risk of heart disease with rheumatoid arthritis was discussed.  Dietary modifications and exercise was emphasized.  Prediabetes  History of pleural effusion /2009  Orders: Orders Placed This Encounter  Procedures   Parathyroid hormone, intact (no Ca)   VITAMIN D 25 Hydroxy (Vit-D Deficiency, Fractures)   TSH   Serum protein electrophoresis with reflex    No orders of the defined types were placed in this encounter.    Follow-Up Instructions: Return in about 3 months (around 01/21/2021) for Rheumatoid arthritis, Osteoarthritis.   Pollyann Savoy, MD  Note - This record has been created using Animal nutritionist.  Chart creation errors have been sought, but may not always  have been located. Such creation errors do not reflect on  the standard of medical care.

## 2020-10-17 ENCOUNTER — Other Ambulatory Visit: Payer: Self-pay | Admitting: *Deleted

## 2020-10-17 DIAGNOSIS — Z79899 Other long term (current) drug therapy: Secondary | ICD-10-CM

## 2020-10-18 LAB — CBC WITH DIFFERENTIAL/PLATELET
Absolute Monocytes: 808 cells/uL (ref 200–950)
Basophils Absolute: 38 cells/uL (ref 0–200)
Basophils Relative: 0.4 %
Eosinophils Absolute: 200 cells/uL (ref 15–500)
Eosinophils Relative: 2.1 %
HCT: 37.5 % (ref 35.0–45.0)
Hemoglobin: 12.7 g/dL (ref 11.7–15.5)
Lymphs Abs: 2385 cells/uL (ref 850–3900)
MCH: 29.5 pg (ref 27.0–33.0)
MCHC: 33.9 g/dL (ref 32.0–36.0)
MCV: 87.2 fL (ref 80.0–100.0)
MPV: 10.7 fL (ref 7.5–12.5)
Monocytes Relative: 8.5 %
Neutro Abs: 6071 cells/uL (ref 1500–7800)
Neutrophils Relative %: 63.9 %
Platelets: 333 10*3/uL (ref 140–400)
RBC: 4.3 10*6/uL (ref 3.80–5.10)
RDW: 14.3 % (ref 11.0–15.0)
Total Lymphocyte: 25.1 %
WBC: 9.5 10*3/uL (ref 3.8–10.8)

## 2020-10-18 LAB — COMPLETE METABOLIC PANEL WITH GFR
AG Ratio: 1.6 (calc) (ref 1.0–2.5)
ALT: 32 U/L — ABNORMAL HIGH (ref 6–29)
AST: 24 U/L (ref 10–35)
Albumin: 4.4 g/dL (ref 3.6–5.1)
Alkaline phosphatase (APISO): 90 U/L (ref 37–153)
BUN: 18 mg/dL (ref 7–25)
CO2: 26 mmol/L (ref 20–32)
Calcium: 9.4 mg/dL (ref 8.6–10.4)
Chloride: 103 mmol/L (ref 98–110)
Creat: 0.76 mg/dL (ref 0.50–1.05)
Globulin: 2.8 g/dL (calc) (ref 1.9–3.7)
Glucose, Bld: 101 mg/dL — ABNORMAL HIGH (ref 65–99)
Potassium: 3.9 mmol/L (ref 3.5–5.3)
Sodium: 139 mmol/L (ref 135–146)
Total Bilirubin: 0.3 mg/dL (ref 0.2–1.2)
Total Protein: 7.2 g/dL (ref 6.1–8.1)
eGFR: 87 mL/min/{1.73_m2} (ref >=60)

## 2020-10-18 NOTE — Progress Notes (Signed)
CBC is normal, ALT is mildly elevated.  Glucose is mildly elevated probably not a fasting sample.  Please advise patient to avoid NSAIDs.  She should also avoid use of omeprazole and may use Pepcid instead.  Omeprazole can increase methotrexate level and cause elevation in liver function and kidney function.

## 2020-10-21 ENCOUNTER — Encounter: Payer: Self-pay | Admitting: Rheumatology

## 2020-10-21 ENCOUNTER — Other Ambulatory Visit: Payer: Self-pay

## 2020-10-21 ENCOUNTER — Ambulatory Visit: Payer: 59 | Admitting: Rheumatology

## 2020-10-21 VITALS — BP 152/81 | HR 118 | Ht 67.5 in | Wt 198.8 lb

## 2020-10-21 DIAGNOSIS — M0579 Rheumatoid arthritis with rheumatoid factor of multiple sites without organ or systems involvement: Secondary | ICD-10-CM

## 2020-10-21 DIAGNOSIS — M7061 Trochanteric bursitis, right hip: Secondary | ICD-10-CM | POA: Diagnosis not present

## 2020-10-21 DIAGNOSIS — Z8679 Personal history of other diseases of the circulatory system: Secondary | ICD-10-CM

## 2020-10-21 DIAGNOSIS — Z8739 Personal history of other diseases of the musculoskeletal system and connective tissue: Secondary | ICD-10-CM

## 2020-10-21 DIAGNOSIS — M19071 Primary osteoarthritis, right ankle and foot: Secondary | ICD-10-CM

## 2020-10-21 DIAGNOSIS — Z8719 Personal history of other diseases of the digestive system: Secondary | ICD-10-CM

## 2020-10-21 DIAGNOSIS — Z79899 Other long term (current) drug therapy: Secondary | ICD-10-CM

## 2020-10-21 DIAGNOSIS — R7303 Prediabetes: Secondary | ICD-10-CM

## 2020-10-21 DIAGNOSIS — Z8639 Personal history of other endocrine, nutritional and metabolic disease: Secondary | ICD-10-CM

## 2020-10-21 DIAGNOSIS — R5383 Other fatigue: Secondary | ICD-10-CM

## 2020-10-21 DIAGNOSIS — M19042 Primary osteoarthritis, left hand: Secondary | ICD-10-CM

## 2020-10-21 DIAGNOSIS — R768 Other specified abnormal immunological findings in serum: Secondary | ICD-10-CM

## 2020-10-21 DIAGNOSIS — M19041 Primary osteoarthritis, right hand: Secondary | ICD-10-CM | POA: Diagnosis not present

## 2020-10-21 DIAGNOSIS — Z8709 Personal history of other diseases of the respiratory system: Secondary | ICD-10-CM

## 2020-10-21 DIAGNOSIS — F5101 Primary insomnia: Secondary | ICD-10-CM

## 2020-10-21 DIAGNOSIS — M19072 Primary osteoarthritis, left ankle and foot: Secondary | ICD-10-CM

## 2020-10-21 NOTE — Patient Instructions (Addendum)
Standing Labs We placed an order today for your standing lab work.   Please have your standing labs drawn in November and every 3 months  If possible, please have your labs drawn 2 weeks prior to your appointment so that the provider can discuss your results at your appointment.  Please note that you may see your imaging and lab results in MyChart before we have reviewed them. We may be awaiting multiple results to interpret others before contacting you. Please allow our office up to 72 hours to thoroughly review all of the results before contacting the office for clarification of your results.  We have open lab daily: Monday through Thursday from 1:30-4:30 PM and Friday from 1:30-4:00 PM at the office of Dr. Pollyann Savoy, [facility].   Please be advised, all patients with office appointments requiring lab work will take precedent over walk-in lab work.  If possible, please come for your lab work on Monday and Friday afternoons, as you may experience shorter wait times. The office is located at [address], [address], [place], [place] [address] No appointment is necessary.   Labs are drawn by [facility]. Please bring your co-pay at the time of your lab draw.  You may receive a bill from [facility] for your lab work.  If you wish to have your labs drawn at another location, please call the office 24 hours in advance to send orders.  If you have any questions regarding directions or hours of operation,  please call [phone].   As a reminder, please drink plenty of water prior to coming for your lab work. Thanks!   Vaccines You are taking a medication(s) that can suppress your immune system.  The following immunizations are recommended: Flu annually Covid-19  Td/Tdap (tetanus, diphtheria, pertussis) every 10 years Pneumonia (Prevnar 15 then Pneumovax 23 at least 1 year apart.  Alternatively, can take Prevnar 20 without needing additional dose) Shingrix (after age 55): 2  doses from 4 weeks to 6 months apart  Please check with your PCP to make sure you are up to date.   If you test POSITIVE for COVID19 and have MILD to MODERATE symptoms: First, call your PCP if you would like to receive COVID19 treatment AND Hold your medications during the infection and for at least 1 week after your symptoms have resolved: Injectable medication (Benlysta, Cimzia, Cosentyx, Enbrel, Humira, Orencia, Remicade, Simponi, Stelara, Taltz, Tremfya) Methotrexate Leflunomide (Arava) Azathioprine Mycophenolate (Cellcept) Osborne [place], or Rinvoq Otezla If you take Actemra or Kevzara, you DO NOT need to hold these for COVID19 infection.  If you test POSITIVE for COVID19 and have NO symptoms: First, call your PCP if you would like to receive COVID19 treatment AND Hold your medications for at least 10 days after the day that you tested positive Injectable medication (Benlysta, Cimzia, Cosentyx, Enbrel, Humira, Orencia, Remicade, Simponi, Stelara, Taltz, Tremfya) Methotrexate Leflunomide (Arava) Azathioprine Mycophenolate (Cellcept) Osborne [place], or Rinvoq Otezla If you take Actemra or Kevzara, you DO NOT need to hold these for COVID19 infection.  If you have signs or symptoms of an infection or start antibiotics: First, call your PCP for workup of your infection. Hold your medication through the infection, until you complete your antibiotics, and until symptoms resolve if you take the following: Injectable medication (Actemra, Benlysta, Cimzia, Cosentyx, Enbrel, Humira, Kevzara, Orencia, Remicade, Simponi, Stelara, Taltz, Tremfya) Methotrexate Leflunomide (Arava) Mycophenolate (Cellcept) Osborne [place], or Rinvoq  Please get annual skin examination by dermatologist to screen for nonmelanoma skin  cancer while you are on Enbrel  Heart Disease Prevention   Your inflammatory disease increases your risk of heart disease which includes heart attack, stroke, atrial  fibrillation (irregular heartbeats), high blood pressure, heart failure and atherosclerosis (plaque in the arteries).  It is important to reduce your risk by:   Keep blood pressure, cholesterol, and blood sugar at healthy levels   Smoking Cessation   Maintain a healthy weight  BMI 20-25   Eat a healthy diet  Plenty of fresh fruit, vegetables, and whole grains  Limit saturated fats, foods high in sodium, and added sugars  DASH and Mediterranean diet   Increase physical activity  Recommend moderate physically activity for 150 minutes per week/ 30 minutes a day for five days a week These can be broken up into three separate ten-minute sessions during the day.   Reduce Stress  Meditation, slow breathing exercises, yoga, coloring books  Dental visits twice a year    Please a schedule a follow-up DEXA scan through your PCP   Alendronate Tablets What is this medication? ALENDRONATE (a LEN droe nate) prevents and treats osteoporosis. It may also be used to treat Paget disease of the bone. It works by Interior and spatial designer stronger and less likely to break (fracture). It belongs to a group of medicationscalled bisphosphonates. This medicine may be used for other purposes; ask your health care provider orpharmacist if you have questions. COMMON BRAND NAME(S): Fosamax What should I tell my care team before I take this medication? They need to know if you have any of these conditions: Bleeding disorder Cancer Dental disease Difficulty swallowing Infection (fever, chills, cough, sore throat, pain or trouble passing urine) Kidney disease Low levels of calcium or other minerals in the blood Low red blood cell counts Receiving steroids like dexamethasone or prednisone Stomach or intestine problems Trouble sitting or standing for 30 minutes An unusual or allergic reaction to alendronate, other medications, foods, dyes or preservatives Pregnant or trying to get pregnant Breast-feeding How  should I use this medication? Take this medication by mouth with a full glass of water. Take it as directed on the prescription label at the same time every day. Take the dose right after waking up. Do not eat or drink anything before taking it. Do not take it with any other drink except water. Do not chew or crush the tablet. After taking it, do not eat breakfast, drink, or take any other medications or vitamins for at least 30 minutes. Sit or stand up for at least 30 minutes after you take it. Donot lie down. Keep taking it unless your care team tells you to stop. A special MedGuide will be given to you by the pharmacist with eachprescription and refill. Be sure to read this information carefully each time. Talk to your care team about the use of this medication in children. Specialcare may be needed. Overdosage: If you think you have taken too much of this medicine contact apoison control center or emergency room at once. NOTE: This medicine is only for you. Do not share this medicine with others. What if I miss a dose? If you take your medication once a day, skip it. Take your next dose at thescheduled time the next morning. Do not take two doses on the same day. If you take your medication once a week, take the missed dose on the morningafter you remember. Do not take two doses on the same day. What may interact with this medication? Aluminum hydroxide Antacids  Aspirin Calcium supplements Medications for inflammation like ibuprofen, naproxen, and others Iron supplements Magnesium supplements Vitamins with minerals This list may not describe all possible interactions. Give your health care provider a list of all the medicines, herbs, non-prescription drugs, or dietary supplements you use. Also tell them if you smoke, drink alcohol, or use illegaldrugs. Some items may interact with your medicine. What should I watch for while using this medication? Visit your care team for regular checks on  your progress. It may be some timebefore you see the benefit from this medication. Some people who take this medication have severe bone, joint, or muscle pain. This medication may also increase your risk for jaw problems or a broken thigh bone. Tell your care team right away if you have severe pain in your jaw, bones, joints, or muscles. Tell you care team if you have any pain that doesnot go away or that gets worse. Tell your dentist and dental surgeon that you are taking this medication. You should not have major dental surgery while on this medication. See your dentist to have a dental exam and fix any dental problems before starting this medication. Take good care of your teeth while on this medication. Make sureyou see your dentist for regular follow-up appointments. You should make sure you get enough calcium and vitamin D while you are taking this medication. Discuss the foods you eat and the vitamins you take with yourcare team. You may need blood work done while you are taking this medication. What side effects may I notice from receiving this medication? Side effects that you should report to your care team as soon as possible: Allergic reactions-skin rash, itching, hives, swelling of the face, lips, tongue, or throat Low calcium level-muscle pain or cramps, confusion, tingling, or numbness in the hands or feet Osteonecrosis of the jaw-pain, swelling, or redness in the mouth, numbness of the jaw, poor healing after dental work, unusual discharge from the mouth, visible bones in the mouth Pain or trouble swallowing Severe bone, joint, or muscle pain Stomach bleeding-bloody or black, tar-like stools, vomiting blood or brown material that looks like coffee grounds Side effects that usually do not require medical attention (report to your careteam if they continue or are bothersome): Constipation Diarrhea Nausea Stomach pain This list may not describe all possible side effects. Call your  doctor for medical advice about side effects. You may report side effects to FDA at1-800-FDA-1088. Where should I keep my medication? Keep out of the reach of children and pets. Store at room temperature between 15 and 30 degrees C (59 and 86 degrees F).Throw away any unused medication after the expiration date. NOTE: This sheet is a summary. It may not cover all possible information. If you have questions about this medicine, talk to your doctor, pharmacist, orhealth care provider.  2022 Elsevier/Gold Standard (2020-03-13 10:35:10)

## 2020-10-28 ENCOUNTER — Other Ambulatory Visit: Payer: Self-pay | Admitting: *Deleted

## 2020-10-28 DIAGNOSIS — M0579 Rheumatoid arthritis with rheumatoid factor of multiple sites without organ or systems involvement: Secondary | ICD-10-CM

## 2020-10-28 MED ORDER — RASUVO 20 MG/0.4ML ~~LOC~~ SOAJ: SUBCUTANEOUS | 0 refills | Status: DC

## 2020-10-28 NOTE — Telephone Encounter (Signed)
Refill request received via fax  Next Visit: 01/20/2021  Last Visit: 10/21/2020  Last Fill: 06/20/2020  DX: Rheumatoid arthritis involving multiple sites with positive rheumatoid factor   Current Dose per office note 10/21/2020: Rasuvo 20 mg every 7 days,  Labs: 10/17/2020 CBC is normal, ALT is mildly elevated.  Glucose is mildly elevated probably not a fasting sample.   Okay to refill Rasuvo?

## 2020-11-13 ENCOUNTER — Other Ambulatory Visit: Payer: Self-pay | Admitting: Rheumatology

## 2020-11-13 DIAGNOSIS — M0579 Rheumatoid arthritis with rheumatoid factor of multiple sites without organ or systems involvement: Secondary | ICD-10-CM

## 2020-11-13 NOTE — Telephone Encounter (Signed)
Next Visit: 01/20/2021  Last Visit: 10/21/2020  Last Fill: 07/30/2020  EH:[id number] arthritis involving multiple sites with positive rheumatoid factor   Current Dose per office note 10/21/2020: Enbrel Mini 50 mg/ml every 7 days  Labs: 10/17/2020, CBC is normal, ALT is mildly elevated.  Glucose is mildly elevated probably not a fasting sample.  Please advise patient to avoid NSAIDs.  She should also avoid use of omeprazole and may use Pepcid instead.  Omeprazole can increase methotrexate level and cause elevation in liver function and kidney function.  TB Gold: 05/16/2020, negative   Okay to refill Enbrel Mini?

## 2020-12-19 ENCOUNTER — Telehealth: Payer: Self-pay

## 2020-12-19 NOTE — Telephone Encounter (Signed)
Reviewed the following with patient:   COVID-19 vaccine recommendations:   COVID-19 vaccine is recommended for everyone (unless you are allergic to a vaccine component), even if you are on a medication that suppresses your immune system.   If you are on Methotrexate, Cellcept (mycophenolate), Rinvoq, Xeljanz, and Olumiant- hold the medication for 1 week after each vaccine. Hold Methotrexate for 2 weeks after the single dose COVID-19 vaccine.   If you are on Orencia subcutaneous injection - hold medication one week prior to and one week after the first COVID-19 vaccine dose (only).   If you are on Orencia IV infusions- time vaccination administration so that the first COVID-19 vaccination will occur four weeks after the infusion and postpone the subsequent infusion by one week.   If you are on Cyclophosphamide or Rituxan infusions please contact your doctor prior to receiving the COVID-19 vaccine.   Do not take Tylenol or any anti-inflammatory medications (NSAIDs) 24 hours prior to the COVID-19 vaccination.   There is no direct evidence about the efficacy of the COVID-19 vaccine in individuals who are on medications that suppress the immune system.   Even if you are fully vaccinated, and you are on any medications that suppress your immune system, please continue to wear a mask, maintain at least six feet social distance and practice hand hygiene.   If you develop a COVID-19 infection, please contact your PCP or our office to determine if you need monoclonal antibody infusion.  The booster vaccine is now available for immunocompromised patients.   Please see the following web sites for updated information.   [email or web address]    

## 2020-12-19 NOTE — Telephone Encounter (Signed)
Patient left a voicemail stating she is scheduled to have her vaccine booster and flu shot on Sunday morning 12/21/20.  Patient states "I'm assuming you will want me to withhold my Methotrexate for a week as in the past.  If that is not the case, please call me back.  Otherwise, that is what I plan to do."

## 2021-01-06 NOTE — Progress Notes (Deleted)
Office Visit Note  Patient: Emma Warner             Date of Birth: Oct 13, 1955           MRN: [id number]             PCP: Lizbeth Bark, NP Referring: Smothers, Cathleen Corti, NP Visit Date: 01/20/2021 Occupation: @GUAROCC @  Subjective:  No chief complaint on file.   History of Present Illness: [name] is a 65 y.o. female ***   Activities of Daily Living:  Patient reports morning stiffness for *** {minute/hour:19697}.   Patient {ACTIONS;DENIES/REPORTS:21021675::"Denies"} nocturnal pain.  Difficulty dressing/grooming: {ACTIONS;DENIES/REPORTS:21021675::"Denies"} Difficulty climbing stairs: {ACTIONS;DENIES/REPORTS:21021675::"Denies"} Difficulty getting out of chair: {ACTIONS;DENIES/REPORTS:21021675::"Denies"} Difficulty using hands for taps, buttons, cutlery, and/or writing: {ACTIONS;DENIES/REPORTS:21021675::"Denies"}  No Rheumatology ROS completed.   PMFS History:  Patient Active Problem List   Diagnosis Date Noted   Primary osteoarthritis of both hands 07/16/2016   Rheumatoid arthritis involving multiple sites with positive rheumatoid factor (HCC) 07/15/2016   ANA positive 07/15/2016   History of gastroesophageal reflux (GERD) 07/15/2016   Ds DNA antibody positive/ [facility] labs show patient does not have lupus 10/2016 07/15/2016   Other fatigue 07/15/2016   High risk medication use 07/15/2016   Primary insomnia 07/15/2016   Hypertension    Hyperlipidemia    GERD (gastroesophageal reflux disease)    Prediabetes    Vitamin D deficiency    ALLERGIC RHINITIS 12/25/2007   PLEURAL EFFUSION 12/25/2007    Past Medical History:  Diagnosis Date   Arthritis    GERD (gastroesophageal reflux disease)    Hyperlipidemia    Hypertension    Prediabetes    Vitamin D deficiency     Family History  Problem Relation Age of Onset   Diabetes Mother    Rheum arthritis Mother    Cancer Father        colon   Cirrhosis Brother        alcoholic   Alcoholism Brother     Past Surgical History:  Procedure Laterality Date   APPENDECTOMY     Social History   Social History Narrative   Not on file   Immunization History  Administered Date(s) Administered   Influenza,inj,Quad PF,6+ Mos 02/13/2016, 01/04/2017, 12/13/2017   Influenza-Unspecified 12/22/2018   PFIZER(Purple Top)SARS-COV-2 Vaccination 05/21/2019, 06/20/2019, 12/28/2019   Td 03/15/2005   Zoster, Live 05/04/2016     Objective: Vital Signs: There were no vitals taken for this visit.   Physical Exam   Musculoskeletal Exam: ***  CDAI Exam: CDAI Score: -- Patient Global: --; Provider Global: -- Swollen: --; Tender: -- Joint Exam 01/20/2021   No joint exam has been documented for this visit   There is currently no information documented on the homunculus. Go to the Rheumatology activity and complete the homunculus joint exam.  Investigation: No additional findings.  Imaging: No results found.  Recent Labs: Lab Results  Component Value Date   WBC 9.5 10/17/2020   HGB 12.7 10/17/2020   PLT 333 10/17/2020   NA 139 10/17/2020   K 3.9 10/17/2020   CL 103 10/17/2020   CO2 26 10/17/2020   GLUCOSE 101 (H) 10/17/2020   BUN 18 10/17/2020   CREATININE 0.76 10/17/2020   BILITOT 0.3 10/17/2020   ALKPHOS 74 10/14/2016   AST 24 10/17/2020   ALT 32 (H) 10/17/2020   PROT 7.2 10/17/2020   ALBUMIN 4.4 10/14/2016   CALCIUM 9.4 10/17/2020   GFRAA 88 05/16/2020   QFTBGOLDPLUS NEGATIVE 05/16/2020  Speciality Comments: No specialty comments available.  Procedures:  No procedures performed Allergies: Patient has no known allergies.   Assessment / Plan:     Visit Diagnoses: No diagnosis found.  Orders: No orders of the defined types were placed in this encounter.  No orders of the defined types were placed in this encounter.   Face-to-face time spent with patient was *** minutes. Greater than 50% of time was spent in counseling and coordination of care.  Follow-Up  Instructions: No follow-ups on file.   Ellen Henri, CMA  Note - This record has been created using Animal nutritionist.  Chart creation errors have been sought, but may not always  have been located. Such creation errors do not reflect on  the standard of medical care.

## 2021-01-20 ENCOUNTER — Ambulatory Visit: Payer: Managed Care, Other (non HMO) | Admitting: Physician Assistant

## 2021-01-20 DIAGNOSIS — M19071 Primary osteoarthritis, right ankle and foot: Secondary | ICD-10-CM

## 2021-01-20 DIAGNOSIS — M7061 Trochanteric bursitis, right hip: Secondary | ICD-10-CM

## 2021-01-20 DIAGNOSIS — R7303 Prediabetes: Secondary | ICD-10-CM

## 2021-01-20 DIAGNOSIS — Z79899 Other long term (current) drug therapy: Secondary | ICD-10-CM

## 2021-01-20 DIAGNOSIS — M19042 Primary osteoarthritis, left hand: Secondary | ICD-10-CM

## 2021-01-20 DIAGNOSIS — Z8709 Personal history of other diseases of the respiratory system: Secondary | ICD-10-CM

## 2021-01-20 DIAGNOSIS — R5383 Other fatigue: Secondary | ICD-10-CM

## 2021-01-20 DIAGNOSIS — F5101 Primary insomnia: Secondary | ICD-10-CM

## 2021-01-20 DIAGNOSIS — R768 Other specified abnormal immunological findings in serum: Secondary | ICD-10-CM

## 2021-01-20 DIAGNOSIS — Z8719 Personal history of other diseases of the digestive system: Secondary | ICD-10-CM

## 2021-01-20 DIAGNOSIS — M0579 Rheumatoid arthritis with rheumatoid factor of multiple sites without organ or systems involvement: Secondary | ICD-10-CM

## 2021-01-20 DIAGNOSIS — Z8679 Personal history of other diseases of the circulatory system: Secondary | ICD-10-CM

## 2021-01-20 DIAGNOSIS — Z8739 Personal history of other diseases of the musculoskeletal system and connective tissue: Secondary | ICD-10-CM

## 2021-01-20 DIAGNOSIS — Z8639 Personal history of other endocrine, nutritional and metabolic disease: Secondary | ICD-10-CM

## 2021-02-02 ENCOUNTER — Other Ambulatory Visit: Payer: Self-pay | Admitting: Physician Assistant

## 2021-02-02 DIAGNOSIS — M0579 Rheumatoid arthritis with rheumatoid factor of multiple sites without organ or systems involvement: Secondary | ICD-10-CM

## 2021-02-02 NOTE — Telephone Encounter (Signed)
Next Visit: 02/12/2021   Last Visit: 10/21/2020   Last Fill: 11/13/2020   RX:YVOPFYTWKM arthritis involving multiple sites with positive rheumatoid factor    Current Dose per office note 10/21/2020: Enbrel Mini 50 mg/ml every 7 days   Labs: 10/17/2020, CBC is normal, ALT is mildly elevated.  Glucose is mildly elevated probably not a fasting sample.  Please advise patient to avoid NSAIDs.  She should also avoid use of omeprazole and may use Pepcid instead.  Omeprazole can increase methotrexate level and cause elevation in liver function and kidney function.   TB Gold: 05/16/2020, negative   Left message to advise patient she is due to update labs.    Okay to refill Enbrel Mini?

## 2021-02-03 NOTE — Progress Notes (Signed)
Office Visit Note  Patient: Emma OremCynthia C Sandiford             Date of Birth: 04-13-1955           MRN: [id number]             PCP: Lizbeth BarkSmothers, Deborah N, NP Referring: Smothers, Cathleen Cortieborah N, NP Visit Date: 02/12/2021 Occupation: @GUAROCC @  Subjective:  Stiffness in both hands   History of Present Illness: Emma Warner is a 65 y.o. female with history of seropositive rheumatoid arthritis and osteoarthritis.  She is currently on enbrel 50 mg sq injections once weekly, rasuvo 20 mg sq injections once weekly, and folic acid 2 mg daily.  She has not missed any doses of these medications recently.  She denies any signs or symptoms of a rheumatoid arthritis flare.  She has noticed some increased stiffness and occasional discomfort in her hands but it has not been severe enough to take any over-the-counter products for symptomatic relief.  She denies any joint swelling.  She continues to work full-time without difficulty.  She continues to experience persistent fatigue.   She denies any recent infections.  She has had the annual flu shot as well as the COVID booster.   She is in the process of cleaning her home in preparation to put it on the market.  She plans [place] to be closer to one of her daughters.     Activities of Daily Living:  Patient reports morning stiffness for 5 minutes.   Patient Denies nocturnal pain.  Difficulty dressing/grooming: Denies Difficulty climbing stairs: Reports Difficulty getting out of chair: Denies Difficulty using hands for taps, buttons, cutlery, and/or writing: Denies  Review of Systems  Constitutional:  Positive for fatigue.  HENT:  Negative for mouth sores, mouth dryness and nose dryness.   Eyes:  Negative for pain, itching and dryness.  Respiratory:  Negative for shortness of breath and difficulty breathing.   Cardiovascular:  Negative for chest pain and palpitations.  Gastrointestinal:  Negative for blood in stool, constipation and  diarrhea.  Endocrine: Negative for increased urination.  Genitourinary:  Negative for difficulty urinating.  Musculoskeletal:  Positive for joint pain, joint pain and morning stiffness. Negative for joint swelling, myalgias, muscle tenderness and myalgias.  Skin:  Negative for color change, rash and redness.  Allergic/Immunologic: Negative for susceptible to infections.  Neurological:  Negative for dizziness, numbness, headaches and memory loss.  Hematological:  Negative for bruising/bleeding tendency.  Psychiatric/Behavioral:  Negative for confusion.    PMFS History:  Patient Active Problem List   Diagnosis Date Noted   Primary osteoarthritis of both hands 07/16/2016   Rheumatoid arthritis involving multiple sites with positive rheumatoid factor (HCC) 07/15/2016   ANA positive 07/15/2016   History of gastroesophageal reflux (GERD) 07/15/2016   Ds DNA antibody positive/ [facility] labs show patient does not have lupus 10/2016 07/15/2016   Other fatigue 07/15/2016   High risk medication use 07/15/2016   Primary insomnia 07/15/2016   Hypertension    Hyperlipidemia    GERD (gastroesophageal reflux disease)    Prediabetes    Vitamin D deficiency    ALLERGIC RHINITIS 12/25/2007   PLEURAL EFFUSION 12/25/2007    Past Medical History:  Diagnosis Date   Arthritis    GERD (gastroesophageal reflux disease)    Hyperlipidemia    Hypertension    Prediabetes    Vitamin D deficiency     Family History  Problem Relation Age of Onset  Diabetes Mother    Rheum arthritis Mother    Cancer Father        colon   Cirrhosis Brother        alcoholic   Alcoholism Brother    Past Surgical History:  Procedure Laterality Date   APPENDECTOMY     Social History   Social History Narrative   Not on file   Immunization History  Administered Date(s) Administered   Influenza,inj,Quad PF,6+ Mos 02/13/2016, 01/04/2017, 12/13/2017   Influenza-Unspecified 12/22/2018   PFIZER(Purple Top)SARS-COV-2  Vaccination 05/21/2019, 06/20/2019, 12/28/2019   Td 03/15/2005   Zoster, Live 05/04/2016     Objective: Vital Signs: BP (!) 138/93 (BP Location: Left Arm, Patient Position: Sitting, Cuff Size: Normal)   Pulse (!) 109   Ht 5' 7.75" (1.721 m)   Wt 201 lb (91.2 kg)   BMI 30.79 kg/m    Physical Exam Vitals and nursing note reviewed.  Constitutional:      Appearance: She is well-developed.  HENT:     Head: Normocephalic and atraumatic.  Eyes:     Conjunctiva/sclera: Conjunctivae normal.  Pulmonary:     Effort: Pulmonary effort is normal.  Abdominal:     Palpations: Abdomen is soft.  Musculoskeletal:     Cervical back: Normal range of motion.  Skin:    General: Skin is warm and dry.     Capillary Refill: Capillary refill takes less than 2 seconds.  Neurological:     Mental Status: She is alert and oriented to person, place, and time.  Psychiatric:        Behavior: Behavior normal.     Musculoskeletal Exam: C-spine, thoracic spine, and lumbar spine have good range of motion with no discomfort.  Shoulder joints, elbow joints, wrist joints, MCPs, PIPs, DIPs have good range of motion with no synovitis.  She was able to make a complete fist bilaterally.  Hip joints have good range of motion with no groin pain.  No tenderness over trochanteric bursa bilaterally.  Knee joints have good range of motion with no warmth or effusion.  Ankle joints have good range of motion with no tenderness or joint swelling.  No tenderness over MTP joints.  No evidence of Achilles tendinitis or plantar fasciitis.  CDAI Exam: CDAI Score: 0.6  Patient Global: 4 mm; Provider Global: 2 mm Swollen: 0 ; Tender: 0  Joint Exam 02/12/2021   No joint exam has been documented for this visit   There is currently no information documented on the homunculus. Go to the Rheumatology activity and complete the homunculus joint exam.  Investigation: No additional findings.  Imaging: No results found.  Recent  Labs: Lab Results  Component Value Date   WBC 9.5 10/17/2020   HGB 12.7 10/17/2020   PLT 333 10/17/2020   NA 139 10/17/2020   K 3.9 10/17/2020   CL 103 10/17/2020   CO2 26 10/17/2020   GLUCOSE 101 (H) 10/17/2020   BUN 18 10/17/2020   CREATININE 0.76 10/17/2020   BILITOT 0.3 10/17/2020   ALKPHOS 74 10/14/2016   AST 24 10/17/2020   ALT 32 (H) 10/17/2020   PROT 7.2 10/17/2020   ALBUMIN 4.4 10/14/2016   CALCIUM 9.4 10/17/2020   GFRAA 88 05/16/2020   QFTBGOLDPLUS NEGATIVE 05/16/2020    Speciality Comments: No specialty comments available.  Procedures:  No procedures performed Allergies: Patient has no known allergies.   Assessment / Plan:     Visit Diagnoses: Rheumatoid arthritis involving multiple sites with positive rheumatoid factor ([place]):  She has no joint tenderness or synovitis.  She is clinically doing well on Enbrel 50 mg sq injections once weekly and rasuvo 20 mg sq injections once weekly.  She is tolerating both medications without any side effects or injection site reactions.  She has not had any signs or symptoms of a rheumatoid arthritis flare recently.  She experiences intermittent stiffness and discomfort in both hands but has no inflammation on examination.  She has no difficulty with ADLs and continues to work full-time.  Her morning stiffness has been lasting about 5 minutes daily.  She has not had any nocturnal pain.  She will remain on the current treatment regimen.  She was advised to notify us if she develops signs or symptoms of a flare while she is in the process of moving.  She will follow-up in the office in 5 months.  High risk medication use - Enbrel Mini 50 mg/ml every 7 days, Rasuvo 20 mg every 7 days, and folic acid 1 mg 2 tablets daily.  CBC and CMP updated on 10/17/20.  She is due to update lab work today.  Orders for CBC and CMP released.  Her next lab work will be due in March and every 3 months to monitor for drug toxicity.  Standing orders for CBC and CMP  remain in place.  TB gold negative on 05/16/20.  Future order for TB gold will be placed today. Plan: QuantiFERON-TB Gold Plus, CBC with Differential/Platelet, COMPLETE METABOLIC PANEL WITH GFR She has not had any recent infections. Discussed the importance of holding enbrel and rasuvo if she develops signs or symptoms of an infection and to resume once the infection has completely cleared.   She has had the annual flu shot and covid-19 booster.   Screening for tuberculosis - Future order for TB gold placed today. Plan: QuantiFERON-TB Gold Plus  Primary osteoarthritis of both hands: She has PIP and DIP thickening consistent with OA of both hands.  Some tenderness over the left CMC joint.  She experiences intermittent discomfort and stiffness in both hands due to underlying osteoarthritis.  Her RA is well controlled on the current treatment regimen.  Discussed the importance of joint protection and muscle strengthening.   Trochanteric bursitis, right hip: Resolved  Primary osteoarthritis of both feet: She has intermittent discomfort in both feet but has no inflammation on examination.  Discussed the importance of wearing proper fitting shoes.  Other fatigue: She continues to have persistent fatigue on a daily basis.  Discussed the importance of regular exercise.   Primary insomnia: She has not been experiencing any nocturnal pain.  Discussed the importance of good sleep hygiene.   Ds DNA antibody positive/ [facility] labs show patient does not have lupus 10/2016-She has no clinical features of systemic lupus.   History of osteoporosis: DEXA 03/13/2009 AP lumbar spine T score -3.1 with BMD of 0.707.  D/c fosamax due to SE.  No recent falls or fractures.  She overdue to update DEXA.  Order has been in place.  She is feeling overwhelmed by a lot of life changes at this time but she plans on having her DEXA updated prior to her next appointment.  Discussed the importance of updating DEXA and lab work prior to  her appointment.  We will further discuss restarting fosamax at that time. She will continue taking a calcium and vitamin D supplement daily.   History of vitamin D deficiency: She is taking a calcium and vitamin D daily.   Other medical  conditions are listed as follows:   History of gastroesophageal reflux (GERD)  History of hypertension  History of hyperlipidemia  Prediabetes  History of pleural effusion /2009    Orders: Orders Placed This Encounter  Procedures   QuantiFERON-TB Gold Plus   CBC with Differential/Platelet   COMPLETE METABOLIC PANEL WITH GFR   Meds ordered this encounter  Medications   folic acid (FOLVITE) 1 MG tablet    Sig: TAKE 2 TABLETS DAILY    Dispense:  180 tablet    Refill:  2     Follow-Up Instructions: Return in about 5 months (around 07/13/2021) for Rheumatoid arthritis, Osteoarthritis.   Ofilia Neas, PA-C  Note - This record has been created using Dragon software.  Chart creation errors have been sought, but may not always  have been located. Such creation errors do not reflect on  the standard of medical care.

## 2021-02-12 ENCOUNTER — Encounter: Payer: Self-pay | Admitting: Physician Assistant

## 2021-02-12 ENCOUNTER — Other Ambulatory Visit: Payer: Self-pay

## 2021-02-12 ENCOUNTER — Ambulatory Visit: Payer: Managed Care, Other (non HMO) | Admitting: Physician Assistant

## 2021-02-12 VITALS — BP 138/93 | HR 109 | Ht 67.75 in | Wt 201.0 lb

## 2021-02-12 DIAGNOSIS — M19072 Primary osteoarthritis, left ankle and foot: Secondary | ICD-10-CM

## 2021-02-12 DIAGNOSIS — M19042 Primary osteoarthritis, left hand: Secondary | ICD-10-CM

## 2021-02-12 DIAGNOSIS — Z8639 Personal history of other endocrine, nutritional and metabolic disease: Secondary | ICD-10-CM

## 2021-02-12 DIAGNOSIS — Z8679 Personal history of other diseases of the circulatory system: Secondary | ICD-10-CM

## 2021-02-12 DIAGNOSIS — F5101 Primary insomnia: Secondary | ICD-10-CM

## 2021-02-12 DIAGNOSIS — M7061 Trochanteric bursitis, right hip: Secondary | ICD-10-CM

## 2021-02-12 DIAGNOSIS — R5383 Other fatigue: Secondary | ICD-10-CM

## 2021-02-12 DIAGNOSIS — Z79899 Other long term (current) drug therapy: Secondary | ICD-10-CM | POA: Diagnosis not present

## 2021-02-12 DIAGNOSIS — M19041 Primary osteoarthritis, right hand: Secondary | ICD-10-CM

## 2021-02-12 DIAGNOSIS — Z8719 Personal history of other diseases of the digestive system: Secondary | ICD-10-CM

## 2021-02-12 DIAGNOSIS — Z8739 Personal history of other diseases of the musculoskeletal system and connective tissue: Secondary | ICD-10-CM

## 2021-02-12 DIAGNOSIS — M0579 Rheumatoid arthritis with rheumatoid factor of multiple sites without organ or systems involvement: Secondary | ICD-10-CM | POA: Diagnosis not present

## 2021-02-12 DIAGNOSIS — R7303 Prediabetes: Secondary | ICD-10-CM

## 2021-02-12 DIAGNOSIS — M19071 Primary osteoarthritis, right ankle and foot: Secondary | ICD-10-CM

## 2021-02-12 DIAGNOSIS — R768 Other specified abnormal immunological findings in serum: Secondary | ICD-10-CM

## 2021-02-12 DIAGNOSIS — R7689 Other specified abnormal immunological findings in serum: Secondary | ICD-10-CM

## 2021-02-12 DIAGNOSIS — Z111 Encounter for screening for respiratory tuberculosis: Secondary | ICD-10-CM

## 2021-02-12 DIAGNOSIS — Z8709 Personal history of other diseases of the respiratory system: Secondary | ICD-10-CM

## 2021-02-12 MED ORDER — FOLIC ACID 1 MG PO TABS: ORAL_TABLET | ORAL | 2 refills | Status: DC

## 2021-02-12 NOTE — Patient Instructions (Signed)
Standing Labs °We placed an order today for your standing lab work.  ° °Please have your standing labs drawn in March and every 3 months  ° ° °If possible, please have your labs drawn 2 weeks prior to your appointment so that the provider can discuss your results at your appointment. ° °Please note that you may see your imaging and lab results in MyChart before we have reviewed them. °We may be awaiting multiple results to interpret others before contacting you. °Please allow our office up to 72 hours to thoroughly review all of the results before contacting the office for clarification of your results. ° °We have open lab daily: °Monday through Thursday from 1:30-4:30 PM and Friday from 1:30-4:00 PM °at the office of Dr. Shaili Deveshwar, [facility].   °Please be advised, all patients with office appointments requiring lab work will take precedent over walk-in lab work.  °If possible, please come for your lab work on Monday and Friday afternoons, as you may experience shorter wait times. °The office is located at [address], [address], [place], [place] [address] °No appointment is necessary.   °Labs are drawn by [facility]. Please bring your co-pay at the time of your lab draw.  You may receive a bill from [facility] for your lab work. ° °If you wish to have your labs drawn at another location, please call the office 24 hours in advance to send orders. ° °If you have any questions regarding directions or hours of operation,  °please call [phone].   °As a reminder, please drink plenty of water prior to coming for your lab work. Thanks! ° °

## 2021-02-13 ENCOUNTER — Telehealth: Payer: Self-pay

## 2021-02-13 ENCOUNTER — Other Ambulatory Visit: Payer: Self-pay | Admitting: *Deleted

## 2021-02-13 LAB — COMPLETE METABOLIC PANEL WITH GFR
AG Ratio: 1.6 (calc) (ref 1.0–2.5)
ALT: 48 U/L — ABNORMAL HIGH (ref 6–29)
AST: 31 U/L (ref 10–35)
Albumin: 4.6 g/dL (ref 3.6–5.1)
Alkaline phosphatase (APISO): 100 U/L (ref 37–153)
BUN: 17 mg/dL (ref 7–25)
CO2: 27 mmol/L (ref 20–32)
Calcium: 10.2 mg/dL (ref 8.6–10.4)
Chloride: 103 mmol/L (ref 98–110)
Creat: 0.7 mg/dL (ref 0.50–1.05)
Globulin: 2.9 g/dL (calc) (ref 1.9–3.7)
Glucose, Bld: 95 mg/dL (ref 65–99)
Potassium: 4.4 mmol/L (ref 3.5–5.3)
Sodium: 141 mmol/L (ref 135–146)
Total Bilirubin: 0.4 mg/dL (ref 0.2–1.2)
Total Protein: 7.5 g/dL (ref 6.1–8.1)
eGFR: 96 mL/min/{1.73_m2} (ref >=60)

## 2021-02-13 LAB — CBC WITH DIFFERENTIAL/PLATELET
Absolute Monocytes: 788 cells/uL (ref 200–950)
Basophils Absolute: 51 cells/uL (ref 0–200)
Basophils Relative: 0.5 %
Eosinophils Absolute: 172 cells/uL (ref 15–500)
Eosinophils Relative: 1.7 %
HCT: 40.8 % (ref 35.0–45.0)
Hemoglobin: 13.7 g/dL (ref 11.7–15.5)
Lymphs Abs: 3010 cells/uL (ref 850–3900)
MCH: 29.7 pg (ref 27.0–33.0)
MCHC: 33.6 g/dL (ref 32.0–36.0)
MCV: 88.5 fL (ref 80.0–100.0)
MPV: 10.8 fL (ref 7.5–12.5)
Monocytes Relative: 7.8 %
Neutro Abs: 6080 cells/uL (ref 1500–7800)
Neutrophils Relative %: 60.2 %
Platelets: 341 10*3/uL (ref 140–400)
RBC: 4.61 10*6/uL (ref 3.80–5.10)
RDW: 13.8 % (ref 11.0–15.0)
Total Lymphocyte: 29.8 %
WBC: 10.1 10*3/uL (ref 3.8–10.8)

## 2021-02-13 NOTE — Telephone Encounter (Signed)
Medication Samples have been provided to the patient.  Drug name: Otrexup       Strength: 15mg /0.47ml        Qty: 2  [id number]  Exp.Date: 05/12/2021   Dosing instructions: Inject 15mg  into the skin once weekly for 2 weeks.

## 2021-02-13 NOTE — Progress Notes (Signed)
Spoke with Dr. Corliss Skains.   She recommends reducing rasuvo to 15 mg for 2 weeks and then rechecking lab work in 2 week. Ok to provide sample x2 prior to changing her prescription.

## 2021-02-13 NOTE — Progress Notes (Signed)
CBC WNL. ALT is elevated and has trended up.  AST WNL.  Please clarify if she has been taking tylenol, NSAIDs, or alcohol use?

## 2021-02-23 ENCOUNTER — Telehealth: Payer: Self-pay | Admitting: Rheumatology

## 2021-02-23 NOTE — Telephone Encounter (Signed)
Patient left a voicemail wanting to know when to have her labs rechecked.

## 2021-02-23 NOTE — Telephone Encounter (Signed)
Error

## 2021-02-23 NOTE — Telephone Encounter (Signed)
Patient advised she is due for labs on Wednesday or Thursday. Patient states she will come on Wednesday.

## 2021-02-25 ENCOUNTER — Other Ambulatory Visit: Payer: Self-pay | Admitting: *Deleted

## 2021-02-25 DIAGNOSIS — Z79899 Other long term (current) drug therapy: Secondary | ICD-10-CM

## 2021-02-26 ENCOUNTER — Other Ambulatory Visit: Payer: Self-pay | Admitting: *Deleted

## 2021-02-26 LAB — CBC WITH DIFFERENTIAL/PLATELET
Absolute Monocytes: 747 cells/uL (ref 200–950)
Basophils Absolute: 36 cells/uL (ref 0–200)
Basophils Relative: 0.4 %
Eosinophils Absolute: 216 cells/uL (ref 15–500)
Eosinophils Relative: 2.4 %
HCT: 38.6 % (ref 35.0–45.0)
Hemoglobin: 12.9 g/dL (ref 11.7–15.5)
Lymphs Abs: 2817 cells/uL (ref 850–3900)
MCH: 29.5 pg (ref 27.0–33.0)
MCHC: 33.4 g/dL (ref 32.0–36.0)
MCV: 88.3 fL (ref 80.0–100.0)
MPV: 11 fL (ref 7.5–12.5)
Monocytes Relative: 8.3 %
Neutro Abs: 5184 cells/uL (ref 1500–7800)
Neutrophils Relative %: 57.6 %
Platelets: 324 10*3/uL (ref 140–400)
RBC: 4.37 10*6/uL (ref 3.80–5.10)
RDW: 13.5 % (ref 11.0–15.0)
Total Lymphocyte: 31.3 %
WBC: 9 10*3/uL (ref 3.8–10.8)

## 2021-02-26 LAB — COMPLETE METABOLIC PANEL WITH GFR
AG Ratio: 1.7 (calc) (ref 1.0–2.5)
ALT: 28 U/L (ref 6–29)
AST: 21 U/L (ref 10–35)
Albumin: 4.4 g/dL (ref 3.6–5.1)
Alkaline phosphatase (APISO): 95 U/L (ref 37–153)
BUN: 13 mg/dL (ref 7–25)
CO2: 28 mmol/L (ref 20–32)
Calcium: 9.7 mg/dL (ref 8.6–10.4)
Chloride: 102 mmol/L (ref 98–110)
Creat: 0.77 mg/dL (ref 0.50–1.05)
Globulin: 2.6 g/dL (calc) (ref 1.9–3.7)
Glucose, Bld: 88 mg/dL (ref 65–99)
Potassium: 4.4 mmol/L (ref 3.5–5.3)
Sodium: 139 mmol/L (ref 135–146)
Total Bilirubin: 0.4 mg/dL (ref 0.2–1.2)
Total Protein: 7 g/dL (ref 6.1–8.1)
eGFR: 86 mL/min/{1.73_m2} (ref >=60)

## 2021-02-26 MED ORDER — RASUVO 15 MG/0.3ML ~~LOC~~ SOAJ: 15.0000 mg | SUBCUTANEOUS | 0 refills | Status: DC

## 2021-02-26 NOTE — Telephone Encounter (Signed)
Ok to continue on rasuvo 15 mg once weekly. Ok to provide sample.

## 2021-02-26 NOTE — Telephone Encounter (Signed)
Patient needs a refill on Rasuvo.   Next Visit: 07/16/2021  Last Visit: 02/12/2021  DX: Rheumatoid arthritis involving multiple sites with positive rheumatoid factor  Current Dose per lab note 02/12/2021: recommends reducing rasuvo to 15 mg for 2 weeks and then rechecking lab work in 2 week  Labs: 02/25/2021 WNL  Okay to refill Rasuvo?    Patient is also requesting a sample as she is due for her injection on Sunday and she won't receive her prescription from the mail order pharmacy until next week.

## 2021-02-26 NOTE — Telephone Encounter (Signed)
-----   Message from Gearldine Bienenstock, PA-C sent at 02/26/2021 11:54 AM EST ----- CBC and CMP WNL

## 2021-02-27 ENCOUNTER — Telehealth: Payer: Self-pay | Admitting: *Deleted

## 2021-02-27 NOTE — Telephone Encounter (Signed)
Medication Samples have been provided to the patient.  Drug name: Otrexup Strength: 15 mg  Qty: 1  LOT: [id number] Exp.Date: 04/2021  Dosing instructions: Inject one pen into skin once weekly.

## 2021-03-10 ENCOUNTER — Other Ambulatory Visit: Payer: Self-pay | Admitting: Physician Assistant

## 2021-03-10 DIAGNOSIS — M0579 Rheumatoid arthritis with rheumatoid factor of multiple sites without organ or systems involvement: Secondary | ICD-10-CM

## 2021-03-10 NOTE — Telephone Encounter (Signed)
Next Visit: 07/16/2021  Last Visit: 02/12/2021  Last Fill: 02/02/2021 (30 day supply)  RX:YVOPFYTWKM arthritis involving multiple sites with positive rheumatoid factor   Current Dose per office note 02/12/2021: Enbrel Mini 50 mg/ml every 7 days,  Labs: 02/25/2021 CBC and CMP WNL   TB Gold: 05/16/2020 Neg   Okay to refill Enbrel?

## 2021-03-20 ENCOUNTER — Telehealth: Payer: Self-pay | Admitting: *Deleted

## 2021-03-20 NOTE — Telephone Encounter (Signed)
Medication Samples have been provided to the patient.  Drug name: Otrexup       Strength: 15 mg        Qty: 3  [id number]  Exp.Date: 04/2021  Dosing instructions: Inject one pen under the skin once weekly.

## 2021-04-02 ENCOUNTER — Telehealth: Payer: Self-pay

## 2021-04-02 NOTE — Telephone Encounter (Signed)
Spoke with Millie and gave verbal authorization to replace Enbrel autotouch device.

## 2021-04-02 NOTE — Telephone Encounter (Signed)
Orvil Feil from [employer] left a voicemail requesting a verbal to replace patient's Enbrel auto touch device with low or dead battery.   Case reference [id number] Phone [phone]   Fax [phone]

## 2021-05-12 ENCOUNTER — Other Ambulatory Visit: Payer: Self-pay | Admitting: *Deleted

## 2021-05-12 MED ORDER — RASUVO 15 MG/0.3ML ~~LOC~~ SOAJ: 15.0000 mg | SUBCUTANEOUS | 0 refills | Status: DC

## 2021-05-12 NOTE — Telephone Encounter (Signed)
Refill request received via fax  Next Visit: 07/16/2021  Last Visit: 02/12/2021  Last Fill: 02/26/2021  DX: Rheumatoid arthritis involving multiple sites with positive rheumatoid factor   Current Dose per office note 02/12/2021: Rasuvo 20 mg every 7 days  Labs: 02/25/2021 CBC and CMP WNL   Okay to refill Rasuvo?

## 2021-05-22 ENCOUNTER — Other Ambulatory Visit: Payer: Self-pay | Admitting: Physician Assistant

## 2021-05-22 ENCOUNTER — Other Ambulatory Visit: Payer: Self-pay | Admitting: *Deleted

## 2021-05-22 DIAGNOSIS — M0579 Rheumatoid arthritis with rheumatoid factor of multiple sites without organ or systems involvement: Secondary | ICD-10-CM

## 2021-05-22 DIAGNOSIS — R5383 Other fatigue: Secondary | ICD-10-CM

## 2021-05-22 DIAGNOSIS — Z79899 Other long term (current) drug therapy: Secondary | ICD-10-CM

## 2021-05-22 DIAGNOSIS — Z111 Encounter for screening for respiratory tuberculosis: Secondary | ICD-10-CM

## 2021-05-22 DIAGNOSIS — R768 Other specified abnormal immunological findings in serum: Secondary | ICD-10-CM

## 2021-05-22 NOTE — Telephone Encounter (Signed)
LMOM labs are due. 

## 2021-05-22 NOTE — Telephone Encounter (Signed)
Next Visit: 07/21/2021 ? ?Last Visit: 02/12/2021 ? ?Last Fill: 03/10/2021 ? ?DX: Rheumatoid arthritis involving multiple sites with positive rheumatoid factor  ? ?Current Dose per office note 02/12/2021: Enbrel Mini 50 mg/ml every 7 days ? ?Labs: 02/25/2021, CBC and CMP WNL  ? ?TB Gold: 05/16/2020, negative  ? ?Okay to refill Enbrel Mini? ? ?

## 2021-05-26 ENCOUNTER — Telehealth: Payer: Self-pay

## 2021-05-26 NOTE — Telephone Encounter (Signed)
Received notification from [employer] regarding a prior authorization for ENBREL. Authorization has been APPROVED from 05/26/2021 to 05/26/2022.   ? ?Authorization # [id number] ?

## 2021-05-26 NOTE — Telephone Encounter (Signed)
PA renewal initiated automatically by CoverMyMeds. ? ?Submitted a Prior Authorization request to [employer] for ENBREL via CoverMyMeds. Will update once we receive a response. ? ? ?Key: [id number] ?

## 2021-05-27 LAB — QUANTIFERON-TB GOLD PLUS
Mitogen-NIL: >10 IU/mL
NIL: 0.01 IU/mL
QuantiFERON-TB Gold Plus: NEGATIVE
TB1-NIL: 0.01 IU/mL
TB2-NIL: 0.01 IU/mL

## 2021-05-27 LAB — CBC WITH DIFFERENTIAL/PLATELET
Absolute Monocytes: 789 cells/uL (ref 200–950)
Basophils Absolute: 38 cells/uL (ref 0–200)
Basophils Relative: 0.4 %
Eosinophils Absolute: 190 cells/uL (ref 15–500)
Eosinophils Relative: 2 %
HCT: 36.8 % (ref 35.0–45.0)
Hemoglobin: 12.3 g/dL (ref 11.7–15.5)
Lymphs Abs: 2860 cells/uL (ref 850–3900)
MCH: 29.4 pg (ref 27.0–33.0)
MCHC: 33.4 g/dL (ref 32.0–36.0)
MCV: 88 fL (ref 80.0–100.0)
MPV: 11.1 fL (ref 7.5–12.5)
Monocytes Relative: 8.3 %
Neutro Abs: 5624 cells/uL (ref 1500–7800)
Neutrophils Relative %: 59.2 %
Platelets: 308 10*3/uL (ref 140–400)
RBC: 4.18 10*6/uL (ref 3.80–5.10)
RDW: 13.4 % (ref 11.0–15.0)
Total Lymphocyte: 30.1 %
WBC: 9.5 10*3/uL (ref 3.8–10.8)

## 2021-05-27 LAB — COMPLETE METABOLIC PANEL WITH GFR
AG Ratio: 1.6 (calc) (ref 1.0–2.5)
ALT: 23 U/L (ref 6–29)
AST: 18 U/L (ref 10–35)
Albumin: 4.2 g/dL (ref 3.6–5.1)
Alkaline phosphatase (APISO): 87 U/L (ref 37–153)
BUN: 17 mg/dL (ref 7–25)
CO2: 27 mmol/L (ref 20–32)
Calcium: 9.2 mg/dL (ref 8.6–10.4)
Chloride: 103 mmol/L (ref 98–110)
Creat: 0.78 mg/dL (ref 0.50–1.05)
Globulin: 2.6 g/dL (calc) (ref 1.9–3.7)
Glucose, Bld: 124 mg/dL — ABNORMAL HIGH (ref 65–99)
Potassium: 4.2 mmol/L (ref 3.5–5.3)
Sodium: 139 mmol/L (ref 135–146)
Total Bilirubin: 0.2 mg/dL (ref 0.2–1.2)
Total Protein: 6.8 g/dL (ref 6.1–8.1)
eGFR: 84 mL/min/{1.73_m2} (ref >=60)

## 2021-05-27 NOTE — Progress Notes (Signed)
CBC and CMP are normal.  Glucose is mildly elevated probably not a fasting sample.

## 2021-05-27 NOTE — Progress Notes (Signed)
TB gold negative

## 2021-06-05 ENCOUNTER — Other Ambulatory Visit (HOSPITAL_COMMUNITY): Payer: Self-pay

## 2021-06-05 ENCOUNTER — Telehealth: Payer: Self-pay | Admitting: Pharmacist

## 2021-06-05 NOTE — Telephone Encounter (Signed)
Received fax from [facility] stating patient's Rasuvo requires PA. Submitted via CMM ? ?Key: [id number] ? ?Per automated response, Drug is covered by current benefit plan. No further PA activity needed. Per test claim, copay is $35 for 28 day supply without copay card ? ?Chesley Mires, PharmD, MPH, BCPS ?Clinical Pharmacist (Rheumatology and Pulmonology) ?

## 2021-06-17 ENCOUNTER — Telehealth: Payer: Self-pay

## 2021-06-17 NOTE — Telephone Encounter (Signed)
Received fax from [facility] stating that their formulary will be updating on July 1st, 2023. Otrexup will be replacing Rasuvo as the preferred formulary medication. We will attempt to submit a PA for Rasuvo after the change of goes into effect, but pt may ultimately be required to switch therapy. ? ?Will provide updates once we are able to begin process. ?

## 2021-07-02 ENCOUNTER — Other Ambulatory Visit (HOSPITAL_COMMUNITY): Payer: Self-pay

## 2021-07-03 ENCOUNTER — Telehealth: Payer: Self-pay | Admitting: Pharmacist

## 2021-07-03 ENCOUNTER — Other Ambulatory Visit (HOSPITAL_COMMUNITY): Payer: Self-pay

## 2021-07-03 NOTE — Telephone Encounter (Signed)
Received prior authorization RENEWAL request for patient's Enbrel Mini from [facility] however ID number does not match up to patient's current plan. Unable to process with RX billing info that populates in eligibility check ? ?Called insurance to initiate authorization via phone but they are experiencing technical difficulties. Will have to call ?Phone; [phone] ? ?Chesley Mires, PharmD, MPH, BCPS ?Clinical Pharmacist (Rheumatology and Pulmonology) ?

## 2021-07-07 NOTE — Progress Notes (Signed)
? ?Office Visit Note ? ?Patient: Emma Warner             ?Date of Birth: 12-Feb-1956           ?MRN: [id number]             ?PCP: Smothers, Cathleen Corti, NP ?Referring: Smothers, Cathleen Corti, NP ?Visit Date: 07/21/2021 ?Occupation: @GUAROCC @ ? ?Subjective:  ?Other (Right 5th digit pain, swelling and numbness. Patient denies injury. ) ? ? ?History of Present Illness: Emma Warner is a 66 y.o. female with history of rheumatoid arthritis and osteoarthritis.  She states she has been tolerating methotrexate and Enbrel without any side effects.  She has been experiencing pain and discomfort in her right fifth finger which she describes over the DIP joint.  None of the other joints are painful. ? ?Activities of Daily Living:  ?Patient reports morning stiffness for 30 minutes.   ?Patient Reports nocturnal pain.  ?Difficulty dressing/grooming: Denies ?Difficulty climbing stairs: Denies ?Difficulty getting out of chair: Denies ?Difficulty using hands for taps, buttons, cutlery, and/or writing: Denies ? ?Review of Systems  ?Constitutional:  Positive for fatigue. Negative for night sweats, weight gain and weight loss.  ?HENT:  Negative for mouth sores, trouble swallowing, trouble swallowing, mouth dryness and nose dryness.   ?Eyes:  Negative for pain, redness, itching, visual disturbance and dryness.  ?Respiratory:  Negative for cough, shortness of breath and difficulty breathing.   ?Cardiovascular:  Negative for chest pain, palpitations, hypertension, irregular heartbeat and swelling in legs/feet.  ?Gastrointestinal:  Negative for blood in stool, constipation and diarrhea.  ?Endocrine: Negative for increased urination.  ?Genitourinary:  Negative for difficulty urinating and vaginal dryness.  ?Musculoskeletal:  Positive for joint pain, joint pain, joint swelling and morning stiffness. Negative for myalgias, muscle weakness, muscle tenderness and myalgias.  ?Skin:  Negative for color change, rash, hair loss, redness, skin  tightness, ulcers and sensitivity to sunlight.  ?Allergic/Immunologic: Negative for susceptible to infections.  ?Neurological:  Positive for numbness. Negative for dizziness, headaches, memory loss, night sweats and weakness.  ?Hematological:  Negative for bruising/bleeding tendency and swollen glands.  ?Psychiatric/Behavioral:  Negative for depressed mood, confusion and sleep disturbance. The patient is not nervous/anxious.   ? ?PMFS History:  ?Patient Active Problem List  ? Diagnosis Date Noted  ? Primary osteoarthritis of both hands 07/16/2016  ? Rheumatoid arthritis involving multiple sites with positive rheumatoid factor (HCC) 07/15/2016  ? ANA positive 07/15/2016  ? History of gastroesophageal reflux (GERD) 07/15/2016  ? Ds DNA antibody positive/ [facility] labs show patient does not have lupus 10/2016 07/15/2016  ? Other fatigue 07/15/2016  ? High risk medication use 07/15/2016  ? Primary insomnia 07/15/2016  ? Hypertension   ? Hyperlipidemia   ? GERD (gastroesophageal reflux disease)   ? Prediabetes   ? Vitamin D deficiency   ? ALLERGIC RHINITIS 12/25/2007  ? PLEURAL EFFUSION 12/25/2007  ?  ?Past Medical History:  ?Diagnosis Date  ? Arthritis   ? GERD (gastroesophageal reflux disease)   ? Hyperlipidemia   ? Hypertension   ? Prediabetes   ? Vitamin D deficiency   ?  ?Family History  ?Problem Relation Age of Onset  ? Diabetes Mother   ? Rheum arthritis Mother   ? Cancer Father   ?     colon  ? Cirrhosis Brother   ?     alcoholic  ? Alcoholism Brother   ? ?Past Surgical History:  ?Procedure Laterality Date  ? APPENDECTOMY    ? ?  Social History  ? ?Social History Narrative  ? Not on file  ? ?Immunization History  ?Administered Date(s) Administered  ? Influenza,inj,Quad PF,6+ Mos 02/13/2016, 01/04/2017, 12/13/2017  ? Influenza-Unspecified 12/22/2018  ? PFIZER(Purple Top)SARS-COV-2 Vaccination 05/21/2019, 06/20/2019, 12/28/2019  ? Td 03/15/2005  ? Zoster, Live 05/04/2016  ?  ? ?Objective: ?Vital Signs: BP (!) 149/89 (BP  Location: Left Arm, Patient Position: Sitting, Cuff Size: Normal)   Pulse (!) 106   Ht 5' 7.75" (1.721 m)   Wt 197 lb 12.8 oz (89.7 kg)   BMI 30.30 kg/m?   ? ?Physical Exam ?Vitals and nursing note reviewed.  ?Constitutional:   ?   Appearance: She is well-developed.  ?HENT:  ?   Head: Normocephalic and atraumatic.  ?Eyes:  ?   Conjunctiva/sclera: Conjunctivae normal.  ?Cardiovascular:  ?   Rate and Rhythm: Normal rate and regular rhythm.  ?   Heart sounds: Normal heart sounds.  ?Pulmonary:  ?   Effort: Pulmonary effort is normal.  ?   Breath sounds: Normal breath sounds.  ?Abdominal:  ?   General: Bowel sounds are normal.  ?   Palpations: Abdomen is soft.  ?Musculoskeletal:  ?   Cervical back: Normal range of motion.  ?Lymphadenopathy:  ?   Cervical: No cervical adenopathy.  ?Skin: ?   General: Skin is warm and dry.  ?   Capillary Refill: Capillary refill takes less than 2 seconds.  ?Neurological:  ?   Mental Status: She is alert and oriented to person, place, and time.  ?Psychiatric:     ?   Behavior: Behavior normal.  ?  ? ?Musculoskeletal Exam: C-spine was in good range of motion.  Shoulder joints, elbow joints, wrist joints with good range of motion.  She had bilateral DIP thickening with some inflammation in her right fourth DIP joint.  Hip joints and knee joints in good range of motion.  There was no tenderness over ankles or MTPs. ? ?CDAI Exam: ?CDAI Score: 0.4  ?Patient Global: 2 mm; Provider Global: 2 mm ?Swollen: 0 ; Tender: 1  ?Joint Exam 07/21/2021  ? ?   Right  Left  ?DIP 5   Tender     ? ? ? ?Investigation: ?No additional findings. ? ?Imaging: ?No results found. ? ?Recent Labs: ?Lab Results  ?Component Value Date  ? WBC 9.5 05/22/2021  ? HGB 12.3 05/22/2021  ? PLT 308 05/22/2021  ? NA 139 05/22/2021  ? K 4.2 05/22/2021  ? CL 103 05/22/2021  ? CO2 27 05/22/2021  ? GLUCOSE 124 (H) 05/22/2021  ? BUN 17 05/22/2021  ? CREATININE 0.78 05/22/2021  ? BILITOT 0.2 05/22/2021  ? ALKPHOS 74 10/14/2016  ? AST  18 05/22/2021  ? ALT 23 05/22/2021  ? PROT 6.8 05/22/2021  ? ALBUMIN 4.4 10/14/2016  ? CALCIUM 9.2 05/22/2021  ? GFRAA 88 05/16/2020  ? QFTBGOLDPLUS NEGATIVE 05/22/2021  ? ? ?Speciality Comments: No specialty comments available. ? ?Procedures:  ?No procedures performed ?Allergies: Patient has no known allergies.  ? ?Assessment / Plan:     ?Visit Diagnoses: Rheumatoid arthritis involving multiple sites with positive rheumatoid factor (HCC)-she has no synovitis on examination.  She has been tolerating Enbrel and Rasuvo combination well.  She has been having discomfort in her hands due to underlying osteoarthritis. ? ?High risk medication use - Enbrel Mini 50 mg/ml every 7 days, Rasuvo 20 mg every 7 days, and folic acid 1 mg 2 tablets daily.  Labs from March 2023 CBC with differential and  CMP with GFR were reviewed which were within normal limits.  TB gold was negative.  She was advised to get labs every 3 months to monitor for drug toxicity.  Information on immunization was placed in the AVS.  She was also advised to stop Enbrel and methotrexate in case she develops an infection and resume after the infection resolves. ? ?Ds DNA antibody positive - Ds DNA antibody positive/ [facility] labs show patient does not have lupus 10/2016- ? ?Other fatigue-related to rheumatoid arthritis. ? ?Primary osteoarthritis of both hands-she has severe arthritis in her DIP joints.  She had inflammatory change in her right fifth digit DIP joint.  Joint protection was discussed. ? ?Primary osteoarthritis of both feet-proper fitting shoes were discussed. ? ?Primary insomnia-good sleep hygiene was discussed. ? ?History of osteoporosis - DEXA 03/13/2009 AP lumbar spine T score -3.1 with BMD of 0.707.  D/c fosamax due to SE.  No recent falls or fractures.  Patient does not want to take any treatment at this time. ? ?History of vitamin D deficiency-use of vitamin D was advised. ? ?History of gastroesophageal reflux (GERD) ? ?History of  hypertension ? ?History of hyperlipidemia ? ?Prediabetes ? ?History of pleural effusion /2009 ? ?Orders: ?No orders of the defined types were placed in this encounter. ? ?No orders of the defined types were placed in this en

## 2021-07-10 ENCOUNTER — Other Ambulatory Visit (HOSPITAL_COMMUNITY): Payer: Self-pay

## 2021-07-10 NOTE — Telephone Encounter (Signed)
Submitted an URGENT Prior Authorization renewal request to [facility] for ENBREL via CoverMyMeds. Will update once we receive a response. ? ?Key: [id number] ? ?Note: patient's name under insurance plan is Emma Warner ? ?Chesley Mires, PharmD, MPH, BCPS ?Clinical Pharmacist (Rheumatology and Pulmonology) ?

## 2021-07-10 NOTE — Telephone Encounter (Addendum)
Received notification from [facility] regarding a prior authorization for ENBREL. Authorization has been APPROVED from 07/10/2021 to 07/09/2022.  ? ?Per test claim, copay for 28 days supply is $100 ? ?Patient can fill through [facility]: [phone]  ? ?Authorization # [id number] ? ? ?Pt should be eligible for Enbrel copay card. ?

## 2021-07-13 ENCOUNTER — Other Ambulatory Visit (HOSPITAL_COMMUNITY): Payer: Self-pay

## 2021-07-13 NOTE — Telephone Encounter (Signed)
[facility] for them to reprocess patient's Enbrel rx now that authorization has been approved. Pharmacy unable to process claim - saying PA required. Advised rep that patient's name in insurance plan is Jenny Reichmann and not Toneisha. Advised her that we do not have approval letter because Josem Kaufmann was approved on CMM with approval letter attached. Rep has notated - once pharmacy is able to process claim, they will notify our clinic ? ?Knox Saliva, PharmD, MPH, BCPS, CPP ?Clinical Pharmacist (Rheumatology and Pulmonology) ?

## 2021-07-16 ENCOUNTER — Ambulatory Visit: Payer: Managed Care, Other (non HMO) | Admitting: Rheumatology

## 2021-07-17 NOTE — Telephone Encounter (Signed)
Called [facility] to determine status of Enbrel rx. Rep able to process rx successfully. MyChart message sent to patient to advise her to call and schedule shipment ? ?Chesley Mires, PharmD, MPH, BCPS, CPP ?Clinical Pharmacist (Rheumatology and Pulmonology) ?

## 2021-07-21 ENCOUNTER — Ambulatory Visit: Payer: BC Managed Care – PPO | Admitting: Rheumatology

## 2021-07-21 ENCOUNTER — Encounter: Payer: Self-pay | Admitting: Rheumatology

## 2021-07-21 VITALS — BP 149/89 | HR 106 | Ht 67.75 in | Wt 197.8 lb

## 2021-07-21 DIAGNOSIS — Z79899 Other long term (current) drug therapy: Secondary | ICD-10-CM | POA: Diagnosis not present

## 2021-07-21 DIAGNOSIS — Z8639 Personal history of other endocrine, nutritional and metabolic disease: Secondary | ICD-10-CM

## 2021-07-21 DIAGNOSIS — M7061 Trochanteric bursitis, right hip: Secondary | ICD-10-CM

## 2021-07-21 DIAGNOSIS — R5383 Other fatigue: Secondary | ICD-10-CM

## 2021-07-21 DIAGNOSIS — Z8739 Personal history of other diseases of the musculoskeletal system and connective tissue: Secondary | ICD-10-CM

## 2021-07-21 DIAGNOSIS — Z8709 Personal history of other diseases of the respiratory system: Secondary | ICD-10-CM

## 2021-07-21 DIAGNOSIS — R768 Other specified abnormal immunological findings in serum: Secondary | ICD-10-CM

## 2021-07-21 DIAGNOSIS — Z8719 Personal history of other diseases of the digestive system: Secondary | ICD-10-CM

## 2021-07-21 DIAGNOSIS — Z8679 Personal history of other diseases of the circulatory system: Secondary | ICD-10-CM

## 2021-07-21 DIAGNOSIS — M0579 Rheumatoid arthritis with rheumatoid factor of multiple sites without organ or systems involvement: Secondary | ICD-10-CM

## 2021-07-21 DIAGNOSIS — M19042 Primary osteoarthritis, left hand: Secondary | ICD-10-CM

## 2021-07-21 DIAGNOSIS — M19071 Primary osteoarthritis, right ankle and foot: Secondary | ICD-10-CM

## 2021-07-21 DIAGNOSIS — M19041 Primary osteoarthritis, right hand: Secondary | ICD-10-CM

## 2021-07-21 DIAGNOSIS — M19072 Primary osteoarthritis, left ankle and foot: Secondary | ICD-10-CM

## 2021-07-21 DIAGNOSIS — R7303 Prediabetes: Secondary | ICD-10-CM

## 2021-07-21 DIAGNOSIS — F5101 Primary insomnia: Secondary | ICD-10-CM

## 2021-07-21 NOTE — Patient Instructions (Signed)
Standing Labs ?We placed an order today for your standing lab work.  ? ?Please have your standing labs drawn in June and every 3 months ? ?If possible, please have your labs drawn 2 weeks prior to your appointment so that the provider can discuss your results at your appointment. ? ?Please note that you may see your imaging and lab results in [facility] before we have reviewed them. ?We may be awaiting multiple results to interpret others before contacting you. ?Please allow our office up to 72 hours to thoroughly review all of the results before contacting the office for clarification of your results. ? ?We have open lab daily: ?Monday through Thursday from 1:30-4:30 PM and Friday from 1:30-4:00 PM ?at the office of Dr. Bo Merino, [facility].   ?Please be advised, all patients with office appointments requiring lab work will take precedent over walk-in lab work.  ?If possible, please come for your lab work on Monday and Friday afternoons, as you may experience shorter wait times. ?The office is located at [address] [place], [place], [place] [address] ?No appointment is necessary.   ?Labs are drawn by [facility]. Please bring your co-pay at the time of your lab draw.  You may receive a bill from [facility] for your lab work. ? ?Please note if you are on Hydroxychloroquine and and an order has been placed for a Hydroxychloroquine level, you will need to have it drawn 4 hours or more after your last dose. ? ?If you wish to have your labs drawn at another location, please call the office 24 hours in advance to send orders. ? ?If you have any questions regarding directions or hours of operation,  ?please call [phone].   ?As a reminder, please drink plenty of water prior to coming for your lab work. Thanks!  ? ?Vaccines ?You are taking a medication(s) that can suppress your immune system.  The following immunizations are recommended: ?Flu annually ?Covid-19  ?Td/Tdap (tetanus, diphtheria, pertussis)  every 10 years ?Pneumonia (Prevnar 15 then Pneumovax 23 at least 1 year apart.  Alternatively, can take Prevnar 20 without needing additional dose) ?Shingrix: 2 doses from 4 weeks to 6 months apart ? ?Please check with your PCP to make sure you are up to date.  ? ?If you have signs or symptoms of an infection or start antibiotics: ?First, call your PCP for workup of your infection. ?Hold your medication through the infection, until you complete your antibiotics, and until symptoms resolve if you take the following: ?Injectable medication (Actemra, Benlysta, Cimzia, Cosentyx, Enbrel, Humira, Kevzara, Orencia, Remicade, Simponi, Melrose, [place], Drew) ?Methotrexate ?Leflunomide Jolee Ewing) ?Mycophenolate (Cellcept) ?Roma Kayser, or Rinvoq  ? ?Please get  an annual skin examination by the dermatologist to screen for skin cancer while you are on Enbrel. ?

## 2021-08-18 ENCOUNTER — Other Ambulatory Visit: Payer: Self-pay | Admitting: *Deleted

## 2021-08-18 MED ORDER — RASUVO 15 MG/0.3ML ~~LOC~~ SOAJ
15.0000 mg | SUBCUTANEOUS | 0 refills | Status: DC
Start: 2021-08-18 — End: 2022-12-07

## 2021-08-18 NOTE — Telephone Encounter (Signed)
Next Visit: 12/22/2021  Last Visit: 07/21/2021  Last Fill: 05/12/2021  DX: Rheumatoid arthritis involving multiple sites with positive rheumatoid factor   Current Dose per office note 07/21/2021: Rasuvo 20 mg every 7 days  Labs: 05/22/2021, CBC and CMP are normal.  Glucose is mildly elevated probably not a fasting sample.  Okay to refill Rasuvo?

## 2021-09-07 ENCOUNTER — Other Ambulatory Visit: Payer: Self-pay | Admitting: Rheumatology

## 2021-09-07 NOTE — Telephone Encounter (Signed)
Patient left a voicemail stating her new insurance [employer] does not cover her Rasuvo medication.  Patient requested a return call to discuss an alternative medication which she thinks is Otrexup.  Patient states "my insurance company is waiting for me to talk to you and then you to talk to them."

## 2021-09-08 ENCOUNTER — Other Ambulatory Visit: Payer: Self-pay | Admitting: Physician Assistant

## 2021-09-08 DIAGNOSIS — M0579 Rheumatoid arthritis with rheumatoid factor of multiple sites without organ or systems involvement: Secondary | ICD-10-CM

## 2021-09-09 ENCOUNTER — Other Ambulatory Visit: Payer: Self-pay | Admitting: *Deleted

## 2021-09-10 NOTE — Telephone Encounter (Addendum)
Received a fax regarding Prior Authorization from [facility] for OTREXUP. Authorization has been DENIED because patient has not tried and failed generic MTX vial injections. Spoke with patient - she confirmed she does not believe she would be able to. Not due to dexterity or hand inflammation.  Advised that we would need a clinical reason to appeal that she cannot use MTX vial and syringe.  She states she understands and is open to going back to taking tablets.  She is planning to have lab work completed this Friday, 09/11/21. She has one Rasuvo pen remaining which she will plan to take this upcoming Sunday, 09/13/21. Advised that we will send rx for MTX tabs after labs result. Advised that she will 6 tabs once weekly on same day at same time and continue folic acid daily as prescribed. She verbalized understanding.  I also inquired if patient is transitioning to Medicare this year. She states she is discussing with HR right now.  She may plan to continue to work part-time but is unsure if she will keep their insurance benefits. Advised that if she switches to Medicare for prescription coverage, we can start process through new insurance and potentially patient assistance. Reviewed income requirements for household of one which she confirms that she does not exceed  Routing to clinical team as FYI for refills  Chesley Mires, PharmD, MPH, BCPS, CPP Clinical Pharmacist (Rheumatology and Pulmonology)

## 2021-09-11 ENCOUNTER — Other Ambulatory Visit: Payer: Self-pay | Admitting: *Deleted

## 2021-09-11 DIAGNOSIS — M0579 Rheumatoid arthritis with rheumatoid factor of multiple sites without organ or systems involvement: Secondary | ICD-10-CM

## 2021-09-11 DIAGNOSIS — R768 Other specified abnormal immunological findings in serum: Secondary | ICD-10-CM

## 2021-09-11 DIAGNOSIS — R5383 Other fatigue: Secondary | ICD-10-CM

## 2021-09-11 DIAGNOSIS — Z79899 Other long term (current) drug therapy: Secondary | ICD-10-CM

## 2021-09-11 MED ORDER — METHOTREXATE 2.5 MG PO TABS: 15.0000 mg | ORAL_TABLET | ORAL | 0 refills | Status: DC

## 2021-09-11 NOTE — Telephone Encounter (Signed)
Patient came to the office and update labs today.

## 2021-09-12 LAB — COMPLETE METABOLIC PANEL WITH GFR
AG Ratio: 1.5 (calc) (ref 1.0–2.5)
ALT: 23 U/L (ref 6–29)
AST: 19 U/L (ref 10–35)
Albumin: 4.5 g/dL (ref 3.6–5.1)
Alkaline phosphatase (APISO): 97 U/L (ref 37–153)
BUN: 19 mg/dL (ref 7–25)
CO2: 26 mmol/L (ref 20–32)
Calcium: 9.8 mg/dL (ref 8.6–10.4)
Chloride: 103 mmol/L (ref 98–110)
Creat: 0.84 mg/dL (ref 0.50–1.05)
Globulin: 3 g/dL (calc) (ref 1.9–3.7)
Glucose, Bld: 92 mg/dL (ref 65–99)
Potassium: 4.3 mmol/L (ref 3.5–5.3)
Sodium: 139 mmol/L (ref 135–146)
Total Bilirubin: 0.3 mg/dL (ref 0.2–1.2)
Total Protein: 7.5 g/dL (ref 6.1–8.1)
eGFR: 77 mL/min/{1.73_m2} (ref >=60)

## 2021-09-12 LAB — CBC WITH DIFFERENTIAL/PLATELET
Absolute Monocytes: 664 cells/uL (ref 200–950)
Basophils Absolute: 50 cells/uL (ref 0–200)
Basophils Relative: 0.6 %
Eosinophils Absolute: 260 cells/uL (ref 15–500)
Eosinophils Relative: 3.1 %
HCT: 38.2 % (ref 35.0–45.0)
Hemoglobin: 12.8 g/dL (ref 11.7–15.5)
Lymphs Abs: 2722 cells/uL (ref 850–3900)
MCH: 29.6 pg (ref 27.0–33.0)
MCHC: 33.5 g/dL (ref 32.0–36.0)
MCV: 88.4 fL (ref 80.0–100.0)
MPV: 10.9 fL (ref 7.5–12.5)
Monocytes Relative: 7.9 %
Neutro Abs: 4704 cells/uL (ref 1500–7800)
Neutrophils Relative %: 56 %
Platelets: 296 10*3/uL (ref 140–400)
RBC: 4.32 10*6/uL (ref 3.80–5.10)
RDW: 13.7 % (ref 11.0–15.0)
Total Lymphocyte: 32.4 %
WBC: 8.4 10*3/uL (ref 3.8–10.8)

## 2021-10-04 ENCOUNTER — Other Ambulatory Visit: Payer: Self-pay | Admitting: Rheumatology

## 2021-10-04 DIAGNOSIS — M0579 Rheumatoid arthritis with rheumatoid factor of multiple sites without organ or systems involvement: Secondary | ICD-10-CM

## 2021-10-05 NOTE — Telephone Encounter (Signed)
Next Visit: 12/22/2021   Last Visit: 07/21/2021   Last Fill: 09/08/2021 (30 day supply)   DX: Rheumatoid arthritis involving multiple sites with positive rheumatoid factor    Current Dose per office note 07/21/2021: Enbrel Mini 50 mg   Labs: 09/11/2021 CBC and CMP WNL   TB Gold: 05/22/2021 Neg    Okay to refill Enbrel?

## 2021-10-21 ENCOUNTER — Telehealth: Payer: Self-pay | Admitting: Rheumatology

## 2021-10-21 NOTE — Telephone Encounter (Signed)
Spoke with Emma Warner at [facility] and gave verbal to replace Enbrel Mini cartridge.

## 2021-10-21 NOTE — Telephone Encounter (Signed)
Heather from [facility] pharmacy left a voicemail stating the patient contacted the manufacturer requesting a replacement for a damaged Enbrel mini cartridge. Herbert Seta is requesting a verbal or new prescription.  Phone# [phone] Case# [id number]

## 2021-11-30 ENCOUNTER — Other Ambulatory Visit: Payer: Self-pay | Admitting: Physician Assistant

## 2021-12-01 NOTE — Telephone Encounter (Signed)
Next Visit: 12/22/2021   Last Visit: 07/21/2021   Last Fill: 09/11/2021  DX: Rheumatoid arthritis involving multiple sites with positive rheumatoid factor    Current Dose per office note 07/21/2021: Rasuvo 20 mg every 7 days. Patient switched to tabs with last refill as her insurance denied the injectable.   Labs: 09/11/2021 CBC and CMP WNL  Spoke with patient and she states she has been taking MTX 6 tabs weekly. Patient is aware she is due to update labs this month.   Okay to refill MTX?

## 2021-12-04 ENCOUNTER — Other Ambulatory Visit: Payer: Self-pay | Admitting: *Deleted

## 2021-12-04 DIAGNOSIS — M0579 Rheumatoid arthritis with rheumatoid factor of multiple sites without organ or systems involvement: Secondary | ICD-10-CM

## 2021-12-04 DIAGNOSIS — R5383 Other fatigue: Secondary | ICD-10-CM | POA: Diagnosis not present

## 2021-12-04 DIAGNOSIS — R768 Other specified abnormal immunological findings in serum: Secondary | ICD-10-CM

## 2021-12-04 DIAGNOSIS — Z79899 Other long term (current) drug therapy: Secondary | ICD-10-CM | POA: Diagnosis not present

## 2021-12-05 LAB — COMPLETE METABOLIC PANEL WITH GFR
AG Ratio: 1.6 (calc) (ref 1.0–2.5)
ALT: 25 U/L (ref 6–29)
AST: 19 U/L (ref 10–35)
Albumin: 4.5 g/dL (ref 3.6–5.1)
Alkaline phosphatase (APISO): 89 U/L (ref 37–153)
BUN: 17 mg/dL (ref 7–25)
CO2: 27 mmol/L (ref 20–32)
Calcium: 9.5 mg/dL (ref 8.6–10.4)
Chloride: 103 mmol/L (ref 98–110)
Creat: 0.74 mg/dL (ref 0.50–1.05)
Globulin: 2.8 g/dL (calc) (ref 1.9–3.7)
Glucose, Bld: 90 mg/dL (ref 65–99)
Potassium: 4.3 mmol/L (ref 3.5–5.3)
Sodium: 139 mmol/L (ref 135–146)
Total Bilirubin: 0.3 mg/dL (ref 0.2–1.2)
Total Protein: 7.3 g/dL (ref 6.1–8.1)
eGFR: 90 mL/min/{1.73_m2} (ref >=60)

## 2021-12-05 LAB — CBC WITH DIFFERENTIAL/PLATELET
Absolute Monocytes: 728 cells/uL (ref 200–950)
Basophils Absolute: 49 cells/uL (ref 0–200)
Basophils Relative: 0.5 %
Eosinophils Absolute: 262 cells/uL (ref 15–500)
Eosinophils Relative: 2.7 %
HCT: 39 % (ref 35.0–45.0)
Hemoglobin: 13.1 g/dL (ref 11.7–15.5)
Lymphs Abs: 2338 cells/uL (ref 850–3900)
MCH: 29.1 pg (ref 27.0–33.0)
MCHC: 33.6 g/dL (ref 32.0–36.0)
MCV: 86.7 fL (ref 80.0–100.0)
MPV: 11 fL (ref 7.5–12.5)
Monocytes Relative: 7.5 %
Neutro Abs: 6324 cells/uL (ref 1500–7800)
Neutrophils Relative %: 65.2 %
Platelets: 293 10*3/uL (ref 140–400)
RBC: 4.5 10*6/uL (ref 3.80–5.10)
RDW: 13.6 % (ref 11.0–15.0)
Total Lymphocyte: 24.1 %
WBC: 9.7 10*3/uL (ref 3.8–10.8)

## 2021-12-07 NOTE — Progress Notes (Signed)
CBC and CMP are normal.

## 2021-12-08 NOTE — Progress Notes (Unsigned)
Office Visit Note  Patient: Emma Warner             Date of Birth: 04-14-1955           MRN: [id number]             PCP: Lizbeth Bark, NP Referring: Smothers, Cathleen Corti, NP Visit Date: 12/22/2021 Occupation: @GUAROCC @  Subjective:  Medication monitoring  History of Present Illness: Emma Warner is a 66 y.o. female with history of seropositive rheumatoid arthritis and osteoarthritis.  She remains on Enbrel Mini 50 mg/ml every 7 days, methotrexate 6 tabs po every 7 days, and folic acid 1 mg 2 tablets daily.  She is tolerating combination therapy without any side effects and has not missed any doses recently.  She denies any recent or recurrent infections.  She denies any signs or symptoms of a rheumatoid arthritis flare.  She states that she experiences intermittent pain in the right fifth finger as well as the left thumb.  She denies any joint swelling.  Has occasional discomfort in her feet which is typically exacerbated by standing or walking prolonged distances.  She denies any other joint pain or joint swelling at this time.  Overall she continues to find Enbrel and methotrexate to be effective at managing her rheumatoid arthritis. She denies any new medical conditions. She is planning on getting the annual flu shot and COVID booster later this month.     Activities of Daily Living:  Patient reports morning stiffness for a few minutes.   Patient Reports nocturnal pain.  Difficulty dressing/grooming: Denies Difficulty climbing stairs: Denies Difficulty getting out of chair: Denies Difficulty using hands for taps, buttons, cutlery, and/or writing: Denies  Review of Systems  Constitutional:  Positive for fatigue.  HENT:  Negative for mouth sores and mouth dryness.   Eyes:  Negative for dryness.  Respiratory:  Negative for shortness of breath.   Cardiovascular:  Negative for chest pain and palpitations.  Gastrointestinal:  Negative for blood in stool, constipation and  diarrhea.  Endocrine: Negative for increased urination.  Genitourinary:  Negative for involuntary urination.  Musculoskeletal:  Positive for myalgias, morning stiffness and myalgias. Negative for joint pain, gait problem, joint pain, joint swelling, muscle weakness and muscle tenderness.  Skin:  Negative for color change, rash, hair loss and sensitivity to sunlight.  Allergic/Immunologic: Negative for susceptible to infections.  Neurological:  Negative for dizziness and headaches.  Hematological:  Negative for swollen glands.  Psychiatric/Behavioral:  Negative for depressed mood and sleep disturbance. The patient is not nervous/anxious.     PMFS History:  Patient Active Problem List   Diagnosis Date Noted   Primary osteoarthritis of both hands 07/16/2016   Rheumatoid arthritis involving multiple sites with positive rheumatoid factor (HCC) 07/15/2016   ANA positive 07/15/2016   History of gastroesophageal reflux (GERD) 07/15/2016   Ds DNA antibody positive/ [facility] labs show patient does not have lupus 10/2016 07/15/2016   Other fatigue 07/15/2016   High risk medication use 07/15/2016   Primary insomnia 07/15/2016   Hypertension    Hyperlipidemia    GERD (gastroesophageal reflux disease)    Prediabetes    Vitamin D deficiency    ALLERGIC RHINITIS 12/25/2007   PLEURAL EFFUSION 12/25/2007    Past Medical History:  Diagnosis Date   Arthritis    GERD (gastroesophageal reflux disease)    Hyperlipidemia    Hypertension    Prediabetes    Vitamin D deficiency     Family History  Problem Relation Age of Onset   Diabetes Mother    Rheum arthritis Mother    Cancer Father        colon   Cirrhosis Brother        alcoholic   Alcoholism Brother    Past Surgical History:  Procedure Laterality Date   APPENDECTOMY     Social History   Social History Narrative   Not on file   Immunization History  Administered Date(s) Administered   Influenza,inj,Quad PF,6+ Mos 02/13/2016,  01/04/2017, 12/13/2017   Influenza-Unspecified 12/22/2018   PFIZER(Purple Top)SARS-COV-2 Vaccination 05/21/2019, 06/20/2019, 12/28/2019   Td 03/15/2005   Zoster, Live 05/04/2016     Objective: Vital Signs: BP (!) 153/85 (BP Location: Left Arm, Patient Position: Sitting, Cuff Size: Normal)   Pulse (!) 102   Resp 15   Ht 5' 7.5" (1.715 m)   Wt 196 lb 9.6 oz (89.2 kg)   BMI 30.34 kg/m    Physical Exam Vitals and nursing note reviewed.  Constitutional:      Appearance: She is well-developed.  HENT:     Head: Normocephalic and atraumatic.  Eyes:     Conjunctiva/sclera: Conjunctivae normal.  Cardiovascular:     Rate and Rhythm: Normal rate and regular rhythm.     Heart sounds: Normal heart sounds.  Pulmonary:     Effort: Pulmonary effort is normal.     Breath sounds: Normal breath sounds.  Abdominal:     General: Bowel sounds are normal.     Palpations: Abdomen is soft.  Musculoskeletal:     Cervical back: Normal range of motion.  Skin:    General: Skin is warm and dry.     Capillary Refill: Capillary refill takes less than 2 seconds.  Neurological:     Mental Status: She is alert and oriented to person, place, and time.  Psychiatric:        Behavior: Behavior normal.      Musculoskeletal Exam: C-spine, thoracic spine, and lumbar spine have good range of motion.  No midline spinal tenderness or SI joint tenderness.  Shoulder joints, elbow joints, wrist joints, MCPs, PIPs, DIPs have good range of motion with no synovitis.  PIP and DIP thickening consistent with osteoarthritis of both hands.  Tenderness over the DIP of the right fifth digit noted.  Some tenderness over the left [facility] joint noted.  Hip joints have good range of motion with no groin pain.  No tenderness over trochanteric bursa bilaterally.  Knee joints have good range of motion with no warmth or effusion.  Ankle joints have good range of motion with no tenderness or joint swelling.  Thickening of bilateral first MTP  joints.  Mild PIP and DIP thickening consistent with osteoarthritis of both feet.  No tenderness or synovitis over MTP joints.  CDAI Exam: CDAI Score: -- Patient Global: 3 mm; Provider Global: 3 mm Swollen: --; Tender: -- Joint Exam 12/22/2021   No joint exam has been documented for this visit   There is currently no information documented on the homunculus. Go to the Rheumatology activity and complete the homunculus joint exam.  Investigation: No additional findings.  Imaging: No results found.  Recent Labs: Lab Results  Component Value Date   WBC 9.7 12/04/2021   HGB 13.1 12/04/2021   PLT 293 12/04/2021   NA 139 12/04/2021   K 4.3 12/04/2021   CL 103 12/04/2021   CO2 27 12/04/2021   GLUCOSE 90 12/04/2021   BUN 17 12/04/2021   CREATININE  0.74 12/04/2021   BILITOT 0.3 12/04/2021   ALKPHOS 74 10/14/2016   AST 19 12/04/2021   ALT 25 12/04/2021   PROT 7.3 12/04/2021   ALBUMIN 4.4 10/14/2016   CALCIUM 9.5 12/04/2021   GFRAA 88 05/16/2020   QFTBGOLDPLUS NEGATIVE 05/22/2021    Speciality Comments: No specialty comments available.  Procedures:  No procedures performed Allergies: Patient has no known allergies.   Assessment / Plan:     Visit Diagnoses: Rheumatoid arthritis involving multiple sites with positive rheumatoid factor (HCC): She has no synovitis on examination today.  She has not had any signs or symptoms of a rheumatoid arthritis flare.  She has clinically been doing well on Enbrel 50 mg sq injections once weekly and methotrexate 6 tablets by mouth once weekly.  She is tolerating combination therapy without any side effects or injection site reactions from Enbrel.  She has not any recent or recurrent infections.  She continues to have morning stiffness lasting a few minutes.  She has not had any difficulty with ADLs.  She will remain on Enbrel and methotrexate as combination therapy.  She was advised to notify us if she develops signs or symptoms of a flare.   X-rays of both hands and feet will be obtained at her follow-up visit to assess for radiographic progression.  She will follow-up in the office in 5 months or sooner if needed.  High risk medication use - Enbrel Mini 50 mg/ml every 7 days, methotrexate 6 tabs po every 7 days, and folic acid 1 mg 2 tablets daily. TB Gold negative on 05/22/2021. CBC and CMP were drawn on 12/04/2021.  Her next lab work will be due in December and every 3 months to monitor for drug toxicity. She has not had any recent or recurrent infections.  Discussed the importance of holding Enbrel and methotrexate if she develops signs or symptoms of an infection and to resume once the infection has completely cleared. She is planning on getting the annual flu shot and COVID booster later this month.  Advised the patient to hold methotrexate 1 week after receiving the COVID booster. Discussed the importance of yearly skin cancer screening while on Enbrel.  Ds DNA antibody positive - Ds DNA antibody positive/ [facility] labs show patient does not have lupus 10/2016.   Other fatigue: Chronic, stable.   Primary osteoarthritis of both hands: She has PIP and DIP thickening consistent with osteoarthritis of both hands.  Tenderness over the DIP of the right fifth digit noted.  Left CMC joint thickening noted.  Discussed the importance of joint protection and muscle strengthening.  She was advised to notify us if she develops increased joint pain or joint swelling.  Primary osteoarthritis of both feet: She experiences intermittent discomfort in both feet.  Her symptoms are exacerbated by walking or standing for prolonged periods of time.  She has no tenderness or synovitis over MTP joints on examination today.  Both ankle joints have good range of motion with no tenderness or synovitis.  Other medical conditions are listed as follows:  Primary insomnia  History of osteoporosis - DEXA 03/13/2009 AP lumbar spine T score -3.1 with BMD of 0.707.   D/c fosamax due to SE.   History of vitamin D deficiency  History of hypertension  History of gastroesophageal reflux (GERD)  Prediabetes  History of hyperlipidemia  History of pleural effusion /2009  Orders: No orders of the defined types were placed in this encounter.  No orders of the defined types were  placed in this encounter.    Follow-Up Instructions: Return in about 5 months (around 05/23/2022) for Rheumatoid arthritis.   Gearldine Bienenstock, PA-C  Note - This record has been created using Dragon software.  Chart creation errors have been sought, but may not always  have been located. Such creation errors do not reflect on  the standard of medical care.

## 2021-12-22 ENCOUNTER — Ambulatory Visit: Payer: BC Managed Care – PPO | Attending: Physician Assistant | Admitting: Physician Assistant

## 2021-12-22 ENCOUNTER — Encounter: Payer: Self-pay | Admitting: Physician Assistant

## 2021-12-22 VITALS — BP 153/85 | HR 102 | Resp 15 | Ht 67.5 in | Wt 196.6 lb

## 2021-12-22 DIAGNOSIS — R768 Other specified abnormal immunological findings in serum: Secondary | ICD-10-CM

## 2021-12-22 DIAGNOSIS — Z8639 Personal history of other endocrine, nutritional and metabolic disease: Secondary | ICD-10-CM

## 2021-12-22 DIAGNOSIS — M0579 Rheumatoid arthritis with rheumatoid factor of multiple sites without organ or systems involvement: Secondary | ICD-10-CM

## 2021-12-22 DIAGNOSIS — M19041 Primary osteoarthritis, right hand: Secondary | ICD-10-CM

## 2021-12-22 DIAGNOSIS — Z8739 Personal history of other diseases of the musculoskeletal system and connective tissue: Secondary | ICD-10-CM

## 2021-12-22 DIAGNOSIS — Z8679 Personal history of other diseases of the circulatory system: Secondary | ICD-10-CM

## 2021-12-22 DIAGNOSIS — M19042 Primary osteoarthritis, left hand: Secondary | ICD-10-CM

## 2021-12-22 DIAGNOSIS — F5101 Primary insomnia: Secondary | ICD-10-CM

## 2021-12-22 DIAGNOSIS — Z8709 Personal history of other diseases of the respiratory system: Secondary | ICD-10-CM

## 2021-12-22 DIAGNOSIS — R5383 Other fatigue: Secondary | ICD-10-CM

## 2021-12-22 DIAGNOSIS — M19072 Primary osteoarthritis, left ankle and foot: Secondary | ICD-10-CM

## 2021-12-22 DIAGNOSIS — Z79899 Other long term (current) drug therapy: Secondary | ICD-10-CM

## 2021-12-22 DIAGNOSIS — R7303 Prediabetes: Secondary | ICD-10-CM

## 2021-12-22 DIAGNOSIS — Z8719 Personal history of other diseases of the digestive system: Secondary | ICD-10-CM

## 2021-12-22 DIAGNOSIS — M19071 Primary osteoarthritis, right ankle and foot: Secondary | ICD-10-CM

## 2021-12-22 NOTE — Patient Instructions (Addendum)
Standing Labs We placed an order today for your standing lab work.   Please have your standing labs drawn in December and every 3 months   Please have your labs drawn 2 weeks prior to your appointment so that the provider can discuss your lab results at your appointment.  Please note that you may see your imaging and lab results in MyChart before we have reviewed them. We will contact you once all results are reviewed. Please allow our office up to 72 hours to thoroughly review all of the results before contacting the office for clarification of your results.  Lab hours are: Monday through Thursday from 1:30 pm-4:30 pm and Friday from 1:30 pm- 4:00 pm  You may experience shorter wait times on Monday, Thursday or Friday afternoons,.   Effective January 11, 2022, new lab hours will be: Monday through Thursday from 8:00 am -12:30 pm and 1:00 pm-5:00 pm and Friday from 8:00 am-12:00 pm.  Please be advised, all patients with office appointments requiring lab work will take precedent over walk-in lab work.   Labs are drawn by [facility]. Please bring your co-pay at the time of your lab draw.  You may receive a bill from [facility] for your lab work.  Please note if you are on Hydroxychloroquine and and an order has been placed for a Hydroxychloroquine level, you will need to have it drawn 4 hours or more after your last dose.  If you wish to have your labs drawn at another location, please call the office 24 hours in advance so we can fax the orders.  The office is located at [address], [address], [place], [place] [address] No appointment is necessary.    If you have any questions regarding directions or hours of operation,  please call [phone].   As a reminder, please drink plenty of water prior to coming for your lab work. Thanks!  If you have signs or symptoms of an infection or start antibiotics: First, call your PCP for workup of your infection. Hold your medication through the  infection, until you complete your antibiotics, and until symptoms resolve if you take the following: Injectable medication (Actemra, Benlysta, Cimzia, Cosentyx, Enbrel, Humira, Kevzara, Orencia, Remicade, Simponi, Stelara, Taltz, Tremfya) Methotrexate Leflunomide (Arava) Mycophenolate (Cellcept) Xeljanz, Olumiant, or Rinvoq    Vaccines You are taking a medication(s) that can suppress your immune system.  The following immunizations are recommended: Flu annually Covid-19  Td/Tdap (tetanus, diphtheria, pertussis) every 10 years Pneumonia (Prevnar 15 then Pneumovax 23 at least 1 year apart.  Alternatively, can take Prevnar 20 without needing additional dose) Shingrix: 2 doses from 4 weeks to 6 months apart  Please check with your PCP to make sure you are up to date.   

## 2021-12-28 ENCOUNTER — Other Ambulatory Visit: Payer: Self-pay | Admitting: Physician Assistant

## 2021-12-28 DIAGNOSIS — M0579 Rheumatoid arthritis with rheumatoid factor of multiple sites without organ or systems involvement: Secondary | ICD-10-CM

## 2021-12-28 NOTE — Telephone Encounter (Signed)
Next Visit: 06/01/2022  Last Visit: 12/22/2021  Last Fill: 10/05/2021  MB:TDHRCBULAG arthritis involving multiple sites with positive rheumatoid factor   Current Dose per office note 12/22/2021: Enbrel Mini 50 mg/ml every 7 days  Labs: 12/04/2021 CBC and CMP are normal.  TB Gold: 05/22/2021 Neg    Okay to refill Enbrel?

## 2022-01-27 ENCOUNTER — Telehealth: Payer: Self-pay | Admitting: Pharmacist

## 2022-01-27 ENCOUNTER — Other Ambulatory Visit (HOSPITAL_COMMUNITY): Payer: Self-pay

## 2022-01-27 DIAGNOSIS — Z79899 Other long term (current) drug therapy: Secondary | ICD-10-CM

## 2022-01-27 DIAGNOSIS — M0579 Rheumatoid arthritis with rheumatoid factor of multiple sites without organ or systems involvement: Secondary | ICD-10-CM

## 2022-01-27 MED ORDER — ENBREL MINI 50 MG/ML ~~LOC~~ SOCT
50.0000 mg | SUBCUTANEOUS | 0 refills | Status: DC
  Filled 2022-01-27: qty 8, fill #0
  Filled 2022-02-05: qty 4, 28d supply, fill #0
  Filled 2022-03-04: qty 4, 28d supply, fill #1

## 2022-01-27 NOTE — Telephone Encounter (Signed)
Received message from [facility] stating that patient must fill her Enbrel with [facility] moving forward. She has three doses at home right now  Rx sent to [facility].  Patient called nad advised of change. She states she has Enbrel debit card information that she can provide to [facility]. She will call [facility] when she is due for refill.  Chesley Mires, PharmD, MPH, BCPS, CPP Clinical Pharmacist (Rheumatology and Pulmonology)

## 2022-02-05 ENCOUNTER — Other Ambulatory Visit (HOSPITAL_COMMUNITY): Payer: Self-pay

## 2022-02-08 ENCOUNTER — Other Ambulatory Visit (HOSPITAL_COMMUNITY): Payer: Self-pay

## 2022-03-02 ENCOUNTER — Other Ambulatory Visit: Payer: Self-pay

## 2022-03-04 ENCOUNTER — Other Ambulatory Visit (HOSPITAL_COMMUNITY): Payer: Self-pay

## 2022-03-04 ENCOUNTER — Other Ambulatory Visit: Payer: Self-pay | Admitting: Physician Assistant

## 2022-03-04 ENCOUNTER — Encounter: Payer: Self-pay | Admitting: *Deleted

## 2022-03-04 MED ORDER — METHOTREXATE SODIUM 2.5 MG PO TABS: 15.0000 mg | ORAL_TABLET | ORAL | 0 refills | Status: DC

## 2022-03-04 NOTE — Telephone Encounter (Addendum)
Next Visit: 06/01/2022   Last Visit: 12/22/2021   Last Fill: 02/12/2021  AJ:OINOMVEHMC arthritis involving multiple sites with positive rheumatoid factor    Current Dose per office note 12/22/2021: folic acid 1 mg 2 tablets daily. methotrexate 6 tabs po every 7 days   Labs: 12/04/2021 CBC and CMP are normal.   Message sent via my chart to advise patient she is due to update labs.   Okay to refill Folic Acid and MTX?

## 2022-03-09 ENCOUNTER — Other Ambulatory Visit: Payer: Self-pay

## 2022-03-11 ENCOUNTER — Other Ambulatory Visit: Payer: Self-pay | Admitting: *Deleted

## 2022-03-11 DIAGNOSIS — M0579 Rheumatoid arthritis with rheumatoid factor of multiple sites without organ or systems involvement: Secondary | ICD-10-CM

## 2022-03-11 DIAGNOSIS — R768 Other specified abnormal immunological findings in serum: Secondary | ICD-10-CM | POA: Diagnosis not present

## 2022-03-11 DIAGNOSIS — R5383 Other fatigue: Secondary | ICD-10-CM | POA: Diagnosis not present

## 2022-03-11 DIAGNOSIS — Z79899 Other long term (current) drug therapy: Secondary | ICD-10-CM

## 2022-03-11 LAB — CBC WITH DIFFERENTIAL/PLATELET
Absolute Monocytes: 800 cells/uL (ref 200–950)
Basophils Absolute: 52 cells/uL (ref 0–200)
Basophils Relative: 0.6 %
Eosinophils Absolute: 232 cells/uL (ref 15–500)
Eosinophils Relative: 2.7 %
HCT: 39.3 % (ref 35.0–45.0)
Hemoglobin: 13.2 g/dL (ref 11.7–15.5)
Lymphs Abs: 2391 cells/uL (ref 850–3900)
MCH: 28.8 pg (ref 27.0–33.0)
MCHC: 33.6 g/dL (ref 32.0–36.0)
MCV: 85.6 fL (ref 80.0–100.0)
MPV: 10.8 fL (ref 7.5–12.5)
Monocytes Relative: 9.3 %
Neutro Abs: 5126 cells/uL (ref 1500–7800)
Neutrophils Relative %: 59.6 %
Platelets: 301 10*3/uL (ref 140–400)
RBC: 4.59 10*6/uL (ref 3.80–5.10)
RDW: 14.2 % (ref 11.0–15.0)
Total Lymphocyte: 27.8 %
WBC: 8.6 10*3/uL (ref 3.8–10.8)

## 2022-03-11 LAB — COMPLETE METABOLIC PANEL WITH GFR
AG Ratio: 1.5 (calc) (ref 1.0–2.5)
ALT: 27 U/L (ref 6–29)
AST: 18 U/L (ref 10–35)
Albumin: 4.5 g/dL (ref 3.6–5.1)
Alkaline phosphatase (APISO): 91 U/L (ref 37–153)
BUN: 18 mg/dL (ref 7–25)
CO2: 28 mmol/L (ref 20–32)
Calcium: 9.8 mg/dL (ref 8.6–10.4)
Chloride: 102 mmol/L (ref 98–110)
Creat: 0.71 mg/dL (ref 0.50–1.05)
Globulin: 3.1 g/dL (calc) (ref 1.9–3.7)
Glucose, Bld: 100 mg/dL — ABNORMAL HIGH (ref 65–99)
Potassium: 4.7 mmol/L (ref 3.5–5.3)
Sodium: 139 mmol/L (ref 135–146)
Total Bilirubin: 0.4 mg/dL (ref 0.2–1.2)
Total Protein: 7.6 g/dL (ref 6.1–8.1)
eGFR: 94 mL/min/{1.73_m2} (ref >=60)

## 2022-03-30 ENCOUNTER — Other Ambulatory Visit (HOSPITAL_COMMUNITY): Payer: Self-pay

## 2022-03-31 ENCOUNTER — Other Ambulatory Visit (HOSPITAL_COMMUNITY): Payer: Self-pay

## 2022-03-31 ENCOUNTER — Other Ambulatory Visit: Payer: Self-pay | Admitting: Physician Assistant

## 2022-03-31 DIAGNOSIS — Z79899 Other long term (current) drug therapy: Secondary | ICD-10-CM

## 2022-03-31 DIAGNOSIS — M0579 Rheumatoid arthritis with rheumatoid factor of multiple sites without organ or systems involvement: Secondary | ICD-10-CM

## 2022-03-31 MED ORDER — ENBREL MINI 50 MG/ML ~~LOC~~ SOCT
50.0000 mg | SUBCUTANEOUS | 2 refills | Status: DC
  Filled 2022-03-31: qty 4, 28d supply, fill #0
  Filled 2022-04-29: qty 4, 28d supply, fill #1
  Filled 2022-05-26: qty 4, 28d supply, fill #2

## 2022-03-31 NOTE — Telephone Encounter (Signed)
Next Visit: 06/01/2022   Last Visit: 12/22/2021   Last Fill: 01/27/2022  TR:VUYEBXIDHW arthritis involving multiple sites with positive rheumatoid factor    Current Dose per office note 12/22/2021:Enbrel Mini 50 mg/ml every 7 days   Labs: 03/11/2022 CBC and CMP WNL    TB Gold: 05/22/2021 Neg     Okay to refill Enbrel?

## 2022-04-06 ENCOUNTER — Other Ambulatory Visit (HOSPITAL_COMMUNITY): Payer: Self-pay

## 2022-04-29 ENCOUNTER — Other Ambulatory Visit (HOSPITAL_COMMUNITY): Payer: Self-pay

## 2022-05-03 ENCOUNTER — Other Ambulatory Visit: Payer: Self-pay

## 2022-05-18 NOTE — Progress Notes (Deleted)
Office Visit Note  Patient: Emma Warner             Date of Birth: March 04, 1956           MRN: [id number]             PCP: Junie Panning, NP Referring: Smothers, Andree Elk, NP Visit Date: 06/01/2022 Occupation: '@GUAROCC'$ @  Subjective:  No chief complaint on file.   History of Present Illness: Emma Warner is a 67 y.o. female ***     Activities of Daily Living:  Patient reports morning stiffness for *** {minute/hour:19697}.   Patient {ACTIONS;DENIES/REPORTS:21021675::"Denies"} nocturnal pain.  Difficulty dressing/grooming: {ACTIONS;DENIES/REPORTS:21021675::"Denies"} Difficulty climbing stairs: {ACTIONS;DENIES/REPORTS:21021675::"Denies"} Difficulty getting out of chair: {ACTIONS;DENIES/REPORTS:21021675::"Denies"} Difficulty using hands for taps, buttons, cutlery, and/or writing: {ACTIONS;DENIES/REPORTS:21021675::"Denies"}  No Rheumatology ROS completed.   PMFS History:  Patient Active Problem List   Diagnosis Date Noted  . Primary osteoarthritis of both hands 07/16/2016  . Rheumatoid arthritis involving multiple sites with positive rheumatoid factor ([place]) 07/15/2016  . ANA positive 07/15/2016  . History of gastroesophageal reflux (GERD) 07/15/2016  . Ds DNA antibody positive/ [facility] labs show patient does not have lupus 10/2016 07/15/2016  . Other fatigue 07/15/2016  . High risk medication use 07/15/2016  . Primary insomnia 07/15/2016  . Hypertension   . Hyperlipidemia   . GERD (gastroesophageal reflux disease)   . Prediabetes   . Vitamin D deficiency   . ALLERGIC RHINITIS 12/25/2007  . PLEURAL EFFUSION 12/25/2007    Past Medical History:  Diagnosis Date  . Arthritis   . GERD (gastroesophageal reflux disease)   . Hyperlipidemia   . Hypertension   . Prediabetes   . Vitamin D deficiency     Family History  Problem Relation Age of Onset  . Diabetes Mother   . Rheum arthritis Mother   . Cancer Father        colon  . Cirrhosis Brother        alcoholic   . Alcoholism Brother    Past Surgical History:  Procedure Laterality Date  . APPENDECTOMY     Social History   Social History Narrative  . Not on file   Immunization History  Administered Date(s) Administered  . Influenza,inj,Quad PF,6+ Mos 02/13/2016, 01/04/2017, 12/13/2017  . Influenza-Unspecified 12/22/2018  . PFIZER(Purple Top)SARS-COV-2 Vaccination 05/21/2019, 06/20/2019, 12/28/2019  . Td 03/15/2005  . Zoster, Live 05/04/2016     Objective: Vital Signs: There were no vitals taken for this visit.   Physical Exam   Musculoskeletal Exam: ***  CDAI Exam: CDAI Score: -- Patient Global: --; Provider Global: -- Swollen: --; Tender: -- Joint Exam 06/01/2022   No joint exam has been documented for this visit   There is currently no information documented on the homunculus. Go to the Rheumatology activity and complete the homunculus joint exam.  Investigation: No additional findings.  Imaging: No results found.  Recent Labs: Lab Results  Component Value Date   WBC 8.6 03/11/2022   HGB 13.2 03/11/2022   PLT 301 03/11/2022   NA 139 03/11/2022   K 4.7 03/11/2022   CL 102 03/11/2022   CO2 28 03/11/2022   GLUCOSE 100 (H) 03/11/2022   BUN 18 03/11/2022   CREATININE 0.71 03/11/2022   BILITOT 0.4 03/11/2022   ALKPHOS 74 10/14/2016   AST 18 03/11/2022   ALT 27 03/11/2022   PROT 7.6 03/11/2022   ALBUMIN 4.4 10/14/2016   CALCIUM 9.8 03/11/2022   GFRAA 88 05/16/2020   QFTBGOLDPLUS NEGATIVE 05/22/2021  Speciality Comments: No specialty comments available.  Procedures:  No procedures performed Allergies: Patient has no known allergies.   Assessment / Plan:     Visit Diagnoses: Rheumatoid arthritis involving multiple sites with positive rheumatoid factor (HCC)  High risk medication use  Other fatigue  Ds DNA antibody positive  Primary osteoarthritis of both hands  Primary osteoarthritis of both feet  Primary insomnia  History of  osteoporosis  History of vitamin D deficiency  History of hypertension  History of gastroesophageal reflux (GERD)  Prediabetes  History of hyperlipidemia  History of pleural effusion /2009  Orders: No orders of the defined types were placed in this encounter.  No orders of the defined types were placed in this encounter.   Face-to-face time spent with patient was *** minutes. Greater than 50% of time was spent in counseling and coordination of care.  Follow-Up Instructions: No follow-ups on file.   Ofilia Neas, PA-C  Note - This record has been created using Dragon software.  Chart creation errors have been sought, but may not always  have been located. Such creation errors do not reflect on  the standard of medical care.

## 2022-05-20 ENCOUNTER — Telehealth: Payer: Self-pay | Admitting: Pharmacist

## 2022-05-20 NOTE — Telephone Encounter (Signed)
Submitted a Prior Authorization renewal request to [facility] for ENBREL via CoverMyMeds. Will update once we receive a response.  Key: [id number]  Knox Saliva, PharmD, MPH, BCPS, CPP Clinical Pharmacist (Rheumatology and Pulmonology)

## 2022-05-21 NOTE — Telephone Encounter (Signed)
Received fax stating that PA request has been cancelled as previous auth already on file and refill is payable on or after 05/25/2022.  Nothing further needed.  Knox Saliva, PharmD, MPH, BCPS, CPP Clinical Pharmacist (Rheumatology and Pulmonology)

## 2022-05-26 ENCOUNTER — Other Ambulatory Visit (HOSPITAL_COMMUNITY): Payer: Self-pay

## 2022-05-26 ENCOUNTER — Other Ambulatory Visit: Payer: Self-pay

## 2022-05-26 MED ORDER — METHOTREXATE SODIUM 2.5 MG PO TABS: 15.0000 mg | ORAL_TABLET | ORAL | 0 refills | Status: DC

## 2022-05-26 NOTE — Telephone Encounter (Signed)
Refill request received via fax from [facility] on [address] for Methotrexate.  Next Visit: 06/23/2022  Last Visit: 12/22/2021  Last Fill: 03/04/2022  DX: Rheumatoid arthritis involving multiple sites with positive rheumatoid factor   Current Dose per office note 12/22/2021: methotrexate 6 tabs po every 7 days   Labs: 03/11/2022 CBC and CMP WNL   Patient to update labs at [place] visit.  Okay to refill Methotrexate?

## 2022-05-31 ENCOUNTER — Other Ambulatory Visit: Payer: Self-pay

## 2022-06-01 ENCOUNTER — Ambulatory Visit: Payer: BC Managed Care – PPO | Admitting: Physician Assistant

## 2022-06-01 DIAGNOSIS — Z8639 Personal history of other endocrine, nutritional and metabolic disease: Secondary | ICD-10-CM

## 2022-06-01 DIAGNOSIS — M19071 Primary osteoarthritis, right ankle and foot: Secondary | ICD-10-CM

## 2022-06-01 DIAGNOSIS — Z8679 Personal history of other diseases of the circulatory system: Secondary | ICD-10-CM

## 2022-06-01 DIAGNOSIS — M19041 Primary osteoarthritis, right hand: Secondary | ICD-10-CM

## 2022-06-01 DIAGNOSIS — F5101 Primary insomnia: Secondary | ICD-10-CM

## 2022-06-01 DIAGNOSIS — Z79899 Other long term (current) drug therapy: Secondary | ICD-10-CM

## 2022-06-01 DIAGNOSIS — R7303 Prediabetes: Secondary | ICD-10-CM

## 2022-06-01 DIAGNOSIS — R768 Other specified abnormal immunological findings in serum: Secondary | ICD-10-CM

## 2022-06-01 DIAGNOSIS — Z8709 Personal history of other diseases of the respiratory system: Secondary | ICD-10-CM

## 2022-06-01 DIAGNOSIS — Z8719 Personal history of other diseases of the digestive system: Secondary | ICD-10-CM

## 2022-06-01 DIAGNOSIS — Z8739 Personal history of other diseases of the musculoskeletal system and connective tissue: Secondary | ICD-10-CM

## 2022-06-01 DIAGNOSIS — M0579 Rheumatoid arthritis with rheumatoid factor of multiple sites without organ or systems involvement: Secondary | ICD-10-CM

## 2022-06-01 DIAGNOSIS — R5383 Other fatigue: Secondary | ICD-10-CM

## 2022-06-09 ENCOUNTER — Other Ambulatory Visit: Payer: Self-pay

## 2022-06-09 DIAGNOSIS — Z9225 Personal history of immunosupression therapy: Secondary | ICD-10-CM

## 2022-06-09 DIAGNOSIS — Z79899 Other long term (current) drug therapy: Secondary | ICD-10-CM

## 2022-06-09 DIAGNOSIS — Z111 Encounter for screening for respiratory tuberculosis: Secondary | ICD-10-CM

## 2022-06-09 NOTE — Progress Notes (Unsigned)
Office Visit Note  Patient: Emma Warner             Date of Birth: 03-Feb-1956           MRN: [id number]             PCP: Lizbeth Bark, NP Referring: Smothers, Cathleen Corti, NP Visit Date: 06/23/2022 Occupation: @GUAROCC @  Subjective:  Discuss medication options  History of Present Illness: Emma Warner is a 67 y.o. female with history of seropositive rheumatoid arthritis and osteoarthritis.  Patient remains on Enbrel Mini 50 mg/ml every 7 days, methotrexate 6 tabs po every 7 days, and folic acid 1 mg 2 tablets daily.  She has been tolerating combination therapy without any side effects.  She has not missed any doses recently.  She denies any signs or symptoms of a rheumatoid arthritis flare.  Patient reports that she has been taking ibuprofen about 3 days a week for generalized aching and joint stiffness.  She denies any joint swelling recently.  Patient states that she is planning to retire soon and has felt overwhelmed thinking about the options for Medicare going forward.  She is concerned about the cost of Enbrel going forward. She denies any recent or recurrent infections. Patient was accompanied by her daughter [name] today in the office.  According to the patient and her daughter she has been suffering from anxiety on a daily basis.  She has tried several coping mechanisms as well as practicing self-care and meditation in the past but continues to have breakthrough symptoms.  Most of her anxiety initially with surrounding work but has started to affect her quality of life.  She has not been sleeping as well at night when feeling anxious.  She would like to discuss possible treatment options for management of anxiety.    Activities of Daily Living:  Patient denies any morning stiffness  Patient Reports nocturnal pain.  Difficulty dressing/grooming: Denies Difficulty climbing stairs: Denies Difficulty getting out of chair: Denies Difficulty using hands for taps, buttons,  cutlery, and/or writing: Denies  Review of Systems  Constitutional:  Positive for fatigue.  HENT:  Negative for mouth sores and mouth dryness.   Eyes:  Negative for dryness.  Respiratory:  Negative for shortness of breath.   Cardiovascular:  Negative for chest pain and palpitations.  Gastrointestinal:  Negative for blood in stool, constipation and diarrhea.  Endocrine: Negative for increased urination.  Genitourinary:  Negative for involuntary urination.  Musculoskeletal:  Positive for joint pain and joint pain. Negative for gait problem, joint swelling, myalgias, muscle weakness, morning stiffness, muscle tenderness and myalgias.  Skin:  Negative for color change, rash, hair loss and sensitivity to sunlight.  Allergic/Immunologic: Negative for susceptible to infections.  Neurological:  Negative for dizziness and headaches.  Hematological:  Negative for swollen glands.  Psychiatric/Behavioral:  Negative for depressed mood and sleep disturbance. The patient is nervous/anxious.     PMFS History:  Patient Active Problem List   Diagnosis Date Noted   Primary osteoarthritis of both hands 07/16/2016   Rheumatoid arthritis involving multiple sites with positive rheumatoid factor 07/15/2016   ANA positive 07/15/2016   History of gastroesophageal reflux (GERD) 07/15/2016   Ds DNA antibody positive/ [facility] labs show patient does not have lupus 10/2016 07/15/2016   Other fatigue 07/15/2016   High risk medication use 07/15/2016   Primary insomnia 07/15/2016   Hypertension    Hyperlipidemia    GERD (gastroesophageal reflux disease)    Prediabetes  Vitamin D deficiency    ALLERGIC RHINITIS 12/25/2007   PLEURAL EFFUSION 12/25/2007    Past Medical History:  Diagnosis Date   Arthritis    GERD (gastroesophageal reflux disease)    Hyperlipidemia    Hypertension    Prediabetes    Vitamin D deficiency     Family History  Problem Relation Age of Onset   Diabetes Mother    Rheum arthritis  Mother    Cancer Father        colon   Cirrhosis Brother        alcoholic   Alcoholism Brother    Past Surgical History:  Procedure Laterality Date   APPENDECTOMY     Social History   Social History Narrative   Not on file   Immunization History  Administered Date(s) Administered   Influenza,inj,Quad PF,6+ Mos 02/13/2016, 01/04/2017, 12/13/2017   Influenza-Unspecified 12/22/2018   PFIZER(Purple Top)SARS-COV-2 Vaccination 05/21/2019, 06/20/2019, 12/28/2019   Td 03/15/2005   Zoster, Live 05/04/2016     Objective: Vital Signs: BP 138/87 (BP Location: Left Arm, Patient Position: Sitting, Cuff Size: Normal)   Pulse (!) 101   Ht 5\' 7"  (1.702 m)   Wt 198 lb (89.8 kg)   BMI 31.01 kg/m    Physical Exam Vitals and nursing note reviewed.  Constitutional:      Appearance: She is well-developed.  HENT:     Head: Normocephalic and atraumatic.  Eyes:     Conjunctiva/sclera: Conjunctivae normal.  Cardiovascular:     Rate and Rhythm: Normal rate and regular rhythm.     Heart sounds: Normal heart sounds.  Pulmonary:     Effort: Pulmonary effort is normal.     Breath sounds: Normal breath sounds.  Abdominal:     General: Bowel sounds are normal.     Palpations: Abdomen is soft.  Musculoskeletal:     Cervical back: Normal range of motion.  Lymphadenopathy:     Cervical: No cervical adenopathy.  Skin:    General: Skin is warm and dry.     Capillary Refill: Capillary refill takes less than 2 seconds.  Neurological:     Mental Status: She is alert and oriented to person, place, and time.  Psychiatric:        Behavior: Behavior normal.      Musculoskeletal Exam: C-spine, thoracic spine, lumbar spine have good range of motion.  No midline spinal tenderness.  Shoulder joints, elbow joints, wrist joints, MCPs, PIPs, DIPs have good range of motion with no synovitis.  PIP and DIP thickening consistent with osteoarthritis of both hands.  Synovial thickening over the left wrist but  no active inflammation or tenderness upon palpation noted.  Hip joints have good range of motion with no groin pain.  No tenderness over the trochanteric bursa bilaterally.  Knee joints have good range of motion with no warmth or effusion.  Ankle joints have good range of motion with no tenderness or joint swelling.  Thickening over bilateral first MTP joints.  PIP and DIP thickening consistent with osteoarthritis of both feet.  Loss of the fat pad noted under the MTP joints.  CDAI Exam: CDAI Score: -- Patient Global: 2 mm; Provider Global: 2 mm Swollen: --; Tender: -- Joint Exam 06/23/2022   No joint exam has been documented for this visit   There is currently no information documented on the homunculus. Go to the Rheumatology activity and complete the homunculus joint exam.  Investigation: No additional findings.  Imaging: No results found.  Recent  Labs: Lab Results  Component Value Date   WBC 10.1 06/09/2022   HGB 12.1 06/09/2022   PLT 299 06/09/2022   NA 140 06/09/2022   K 4.6 06/09/2022   CL 104 06/09/2022   CO2 28 06/09/2022   GLUCOSE 93 06/09/2022   BUN 20 06/09/2022   CREATININE 0.73 06/09/2022   BILITOT 0.2 06/09/2022   ALKPHOS 74 10/14/2016   AST 16 06/09/2022   ALT 19 06/09/2022   PROT 7.1 06/09/2022   ALBUMIN 4.4 10/14/2016   CALCIUM 10.0 06/09/2022   GFRAA 88 05/16/2020   QFTBGOLDPLUS NEGATIVE 06/09/2022    Speciality Comments: No specialty comments available.  Procedures:  No procedures performed Allergies: Patient has no known allergies.   Assessment / Plan:     Visit Diagnoses: Rheumatoid arthritis involving multiple sites with positive rheumatoid factor: She has no joint tenderness or synovitis on examination today.  She has not had any signs or symptoms of a rheumatoid arthritis flare.  She has clinically been doing well on Enbrel 50 mg subcutaneous injections once weekly, methotrexate 6 tablets by mouth once weekly, and folic acid 2 mg daily.  She  has been tolerating combination therapy without any side effects and has not missed any doses recently.  She has not had any recent or recurrent infections.  She has not been experiencing any morning stiffness.  She takes ibuprofen about 3 times a week to alleviate generalized arthralgias.  Patient was encouraged to try to limit the use of ibuprofen due to concurrent use of methotrexate.  Plan to initiate a trial of Cymbalta 30 mg 1 capsule daily for better management of anxiety, depression and to hopefully alleviate some pain.  She will notify us if she cannot tolerate taking Cymbalta.  She will remain on Enbrel, methotrexate, and folic acid as prescribed.  She will follow-up in the office in 5 months or sooner if needed.  High risk medication use - Enbrel Mini 50 mg/ml every 7 days, methotrexate 6 tabs by mouth every 7 days, and folic acid 1 mg 2 tablets daily. Encouraged patient to try to avoid NSAID use. CBC and CMP were drawn on 06/09/2022.  Her next lab work will be due in June and every 3 months to monitor for drug toxicity. TB Gold negative on 06/09/2022 and will continue to monitor yearly. No recent or recurrent infections.  Discussed the importance of holding Enbrel and methotrexate if she develops signs or symptoms of infection and to resume once the infection has completely cleared.  Primary osteoarthritis of both hands: She has PIP and DIP thickening consistent with osteoarthritis of both hands.  No tenderness or inflammation noted.  Discussed the importance of joint protection and muscle strengthening.  Primary osteoarthritis of both feet: Thickening over bilateral first MTP joints.  Some PIP and DIP thickening consistent with osteoarthritis of both feet.  No tenderness or synovitis over the MTP joints.  Loss of fat pad noted under MTPs.  Good range of motion of both ankle joints with no tenderness or synovitis.  Discussed the importance of wearing proper fitting shoes.  GAD (generalized  anxiety disorder): Patient has had longstanding depression and anxiety.  Patient has been practicing coping mechanisms and self-care but her symptoms have started to interfere with her quality of life.  She has been suffering from symptoms of anxiety on a daily basis. At times she has difficulty sleeping due to the anxiety.  She will be retiring soon which she has been helpful would help with  her anxiety but she started to have increase situational anxiety around determining which insurance plan will be best for her and her chronic prescriptions.  Both her and her daughter feel that it is time to discuss treatment options to help better manage her anxiety and depression.  Different treatment options were discussed today.  Indications, contraindications, potential side effects of Cymbalta were discussed today in detail.  Discussed that Cymbalta can also help alleviate pain in some cases which she may benefit from.  Plan on initiating a trial of Cymbalta 30 mg 1 capsule by mouth daily.  She was advised to notify us if she cannot tolerate taking Cymbalta.  She will also notify us if she does not notice any clinical benefit.  If needed we can increase the dose of Cymbalta to 60 mg daily.  Primary insomnia:At times she has difficulty sleeping at night due to anxiety.  Plan on initiating Cymbalta as discussed above to better manage her anxiety and depression.  Other fatigue: Chronic, stable.  Discussed the importance of increasing her exercise regimen.  Ds DNA antibody positive - Ds DNA antibody positive/ [facility] labs show patient does not have lupus 10/2016.  No clinical features of systemic lupus.  Other medical conditions are listed as follows:  History of osteoporosis - DEXA 03/13/2009 AP lumbar spine T score -3.1 with BMD of 0.707.  D/c fosamax due to SE. She is taking a calcium and vitamin D supplement daily.  History of vitamin D deficiency: She is taking a calcium vitamin D supplement daily.  History of  hypertension: Blood pressure was 138/87 today in the office.  Other medical conditions are listed as follows:  History of gastroesophageal reflux (GERD)  Prediabetes  History of hyperlipidemia  History of pleural effusion /2009: No recurrence.  Orders: No orders of the defined types were placed in this encounter.  Meds ordered this encounter  Medications   DULoxetine (CYMBALTA) 30 MG capsule    Sig: Take 1 capsule (30 mg total) by mouth daily.    Dispense:  30 capsule    Refill:  1    Follow-Up Instructions: Return in about 5 months (around 11/23/2022) for Rheumatoid arthritis, Osteoarthritis.   Gearldine Bienenstockaylor M Ayona Yniguez, PA-C  Note - This record has been created using Dragon software.  Chart creation errors have been sought, but may not always  have been located. Such creation errors do not reflect on  the standard of medical care.

## 2022-06-10 NOTE — Progress Notes (Signed)
CBC and CMP are normal.

## 2022-06-11 LAB — QUANTIFERON-TB GOLD PLUS
Mitogen-NIL: >10 IU/mL
NIL: 0.03 IU/mL
QuantiFERON-TB Gold Plus: NEGATIVE
TB1-NIL: 0 IU/mL
TB2-NIL: 0 IU/mL

## 2022-06-11 LAB — COMPLETE METABOLIC PANEL WITH GFR
AG Ratio: 1.6 (calc) (ref 1.0–2.5)
ALT: 19 U/L (ref 6–29)
AST: 16 U/L (ref 10–35)
Albumin: 4.4 g/dL (ref 3.6–5.1)
Alkaline phosphatase (APISO): 81 U/L (ref 37–153)
BUN: 20 mg/dL (ref 7–25)
CO2: 28 mmol/L (ref 20–32)
Calcium: 10 mg/dL (ref 8.6–10.4)
Chloride: 104 mmol/L (ref 98–110)
Creat: 0.73 mg/dL (ref 0.50–1.05)
Globulin: 2.7 g/dL (calc) (ref 1.9–3.7)
Glucose, Bld: 93 mg/dL (ref 65–99)
Potassium: 4.6 mmol/L (ref 3.5–5.3)
Sodium: 140 mmol/L (ref 135–146)
Total Bilirubin: 0.2 mg/dL (ref 0.2–1.2)
Total Protein: 7.1 g/dL (ref 6.1–8.1)
eGFR: 91 mL/min/{1.73_m2} (ref >=60)

## 2022-06-11 LAB — CBC WITH DIFFERENTIAL/PLATELET
Absolute Monocytes: 838 cells/uL (ref 200–950)
Basophils Absolute: 51 cells/uL (ref 0–200)
Basophils Relative: 0.5 %
Eosinophils Absolute: 303 cells/uL (ref 15–500)
Eosinophils Relative: 3 %
HCT: 36.7 % (ref 35.0–45.0)
Hemoglobin: 12.1 g/dL (ref 11.7–15.5)
Lymphs Abs: 2929 cells/uL (ref 850–3900)
MCH: 29 pg (ref 27.0–33.0)
MCHC: 33 g/dL (ref 32.0–36.0)
MCV: 88 fL (ref 80.0–100.0)
MPV: 11.1 fL (ref 7.5–12.5)
Monocytes Relative: 8.3 %
Neutro Abs: 5979 cells/uL (ref 1500–7800)
Neutrophils Relative %: 59.2 %
Platelets: 299 10*3/uL (ref 140–400)
RBC: 4.17 10*6/uL (ref 3.80–5.10)
RDW: 13.9 % (ref 11.0–15.0)
Total Lymphocyte: 29 %
WBC: 10.1 10*3/uL (ref 3.8–10.8)

## 2022-06-12 NOTE — Progress Notes (Signed)
TB Gold is negative.

## 2022-06-22 ENCOUNTER — Other Ambulatory Visit: Payer: Self-pay

## 2022-06-22 ENCOUNTER — Other Ambulatory Visit (HOSPITAL_COMMUNITY): Payer: Self-pay

## 2022-06-22 ENCOUNTER — Other Ambulatory Visit: Payer: Self-pay | Admitting: Physician Assistant

## 2022-06-22 DIAGNOSIS — M0579 Rheumatoid arthritis with rheumatoid factor of multiple sites without organ or systems involvement: Secondary | ICD-10-CM

## 2022-06-22 DIAGNOSIS — Z79899 Other long term (current) drug therapy: Secondary | ICD-10-CM

## 2022-06-22 MED ORDER — ENBREL MINI 50 MG/ML ~~LOC~~ SOCT
50.0000 mg | SUBCUTANEOUS | 2 refills | Status: DC
  Filled 2022-06-22: qty 4, 28d supply, fill #0

## 2022-06-22 NOTE — Telephone Encounter (Signed)
Last Fill: 03/31/2022  Labs: 06/09/2022 CBC and CMP are normal.   TB Gold: 06/09/2022 Neg    Next Visit: 06/23/2022  Last Visit: 12/22/2021  KR:[id number] arthritis involving multiple sites with positive rheumatoid factor   Current Dose per office note 12/22/2021: Enbrel Mini 50 mg/ml every 7 days   Okay to refill Enbrel?

## 2022-06-23 ENCOUNTER — Encounter: Payer: Self-pay | Admitting: Physician Assistant

## 2022-06-23 ENCOUNTER — Ambulatory Visit: Payer: BC Managed Care – PPO | Attending: Rheumatology | Admitting: Physician Assistant

## 2022-06-23 VITALS — BP 138/87 | HR 101 | Ht 67.0 in | Wt 198.0 lb

## 2022-06-23 DIAGNOSIS — Z79899 Other long term (current) drug therapy: Secondary | ICD-10-CM | POA: Diagnosis not present

## 2022-06-23 DIAGNOSIS — Z8679 Personal history of other diseases of the circulatory system: Secondary | ICD-10-CM

## 2022-06-23 DIAGNOSIS — F411 Generalized anxiety disorder: Secondary | ICD-10-CM

## 2022-06-23 DIAGNOSIS — M19072 Primary osteoarthritis, left ankle and foot: Secondary | ICD-10-CM

## 2022-06-23 DIAGNOSIS — M19041 Primary osteoarthritis, right hand: Secondary | ICD-10-CM | POA: Diagnosis not present

## 2022-06-23 DIAGNOSIS — R7303 Prediabetes: Secondary | ICD-10-CM

## 2022-06-23 DIAGNOSIS — Z8709 Personal history of other diseases of the respiratory system: Secondary | ICD-10-CM

## 2022-06-23 DIAGNOSIS — M19042 Primary osteoarthritis, left hand: Secondary | ICD-10-CM

## 2022-06-23 DIAGNOSIS — M19071 Primary osteoarthritis, right ankle and foot: Secondary | ICD-10-CM

## 2022-06-23 DIAGNOSIS — Z8719 Personal history of other diseases of the digestive system: Secondary | ICD-10-CM

## 2022-06-23 DIAGNOSIS — R5383 Other fatigue: Secondary | ICD-10-CM

## 2022-06-23 DIAGNOSIS — Z8639 Personal history of other endocrine, nutritional and metabolic disease: Secondary | ICD-10-CM

## 2022-06-23 DIAGNOSIS — M0579 Rheumatoid arthritis with rheumatoid factor of multiple sites without organ or systems involvement: Secondary | ICD-10-CM | POA: Diagnosis not present

## 2022-06-23 DIAGNOSIS — Z8739 Personal history of other diseases of the musculoskeletal system and connective tissue: Secondary | ICD-10-CM

## 2022-06-23 DIAGNOSIS — F5101 Primary insomnia: Secondary | ICD-10-CM

## 2022-06-23 DIAGNOSIS — R768 Other specified abnormal immunological findings in serum: Secondary | ICD-10-CM

## 2022-06-23 MED ORDER — DULOXETINE HCL 30 MG PO CPEP: 30.0000 mg | ORAL_CAPSULE | Freq: Every day | ORAL | 1 refills | Status: DC

## 2022-06-23 NOTE — Patient Instructions (Signed)
Standing Labs We placed an order today for your standing lab work.   Please have your standing labs drawn in June and every 3 months  Please have your labs drawn 2 weeks prior to your appointment so that the provider can discuss your lab results at your appointment, if possible.  Please note that you may see your imaging and lab results in MyChart before we have reviewed them. We will contact you once all results are reviewed. Please allow our office up to 72 hours to thoroughly review all of the results before contacting the office for clarification of your results.  WALK-IN LAB HOURS  Monday through Thursday from 8:00 am -12:30 pm and 1:00 pm-5:00 pm and Friday from 8:00 am-12:00 pm.  Patients with office visits requiring labs will be seen before walk-in labs.  You may encounter longer than normal wait times. Please allow additional time. Wait times may be shorter on  Monday and Thursday afternoons.  We do not book appointments for walk-in labs. We appreciate your patience and understanding with our staff.   Labs are drawn by [facility]. Please bring your co-pay at the time of your lab draw.  You may receive a bill from [facility] for your lab work.  Please note if you are on Hydroxychloroquine and and an order has been placed for a Hydroxychloroquine level,  you will need to have it drawn 4 hours or more after your last dose.  If you wish to have your labs drawn at another location, please call the office 24 hours in advance so we can fax the orders.  The office is located at [address], [address], [place], [place] [address]   If you have any questions regarding directions or hours of operation,  please call [phone].   As a reminder, please drink plenty of water prior to coming for your lab work. Thanks!  If you have signs or symptoms of an infection or start antibiotics: First, call your PCP for workup of your infection. Hold your medication through the infection, until you  complete your antibiotics, and until symptoms resolve if you take the following: Injectable medication (Actemra, Benlysta, Cimzia, Cosentyx, Enbrel, Humira, Kevzara, Orencia, Remicade, Simponi, Stelara, Taltz, Tremfya) Methotrexate Leflunomide (Arava) Mycophenolate (Cellcept) Xeljanz, Olumiant, or Rinvoq Vaccines You are taking a medication(s) that can suppress your immune system.  The following immunizations are recommended: Flu annually Covid-19  Td/Tdap (tetanus, diphtheria, pertussis) every 10 years Pneumonia (Prevnar 15 then Pneumovax 23 at least 1 year apart.  Alternatively, can take Prevnar 20 without needing additional dose) Shingrix: 2 doses from 4 weeks to 6 months apart  Please check with your PCP to make sure you are up to date.   

## 2022-06-28 ENCOUNTER — Other Ambulatory Visit: Payer: Self-pay

## 2022-06-29 ENCOUNTER — Other Ambulatory Visit: Payer: Self-pay

## 2022-06-30 ENCOUNTER — Telehealth: Payer: Self-pay | Admitting: Pharmacist

## 2022-06-30 ENCOUNTER — Telehealth: Payer: Self-pay | Admitting: Rheumatology

## 2022-06-30 DIAGNOSIS — M0579 Rheumatoid arthritis with rheumatoid factor of multiple sites without organ or systems involvement: Secondary | ICD-10-CM

## 2022-06-30 DIAGNOSIS — Z79899 Other long term (current) drug therapy: Secondary | ICD-10-CM

## 2022-06-30 NOTE — Telephone Encounter (Signed)
Received notification from [facility] regarding a prior authorization for ENBREL. Authorization has been APPROVED from 06/30/22 to 06/30/23. Approval letter sent to scan center.  Patient can continue to fill through [facility]: [phone]   Authorization # [id number]  Chesley Mires, PharmD, MPH, BCPS, CPP Clinical Pharmacist (Rheumatology and Pulmonology)

## 2022-06-30 NOTE — Telephone Encounter (Addendum)
Submitted an urgent Prior Authorization renewal request to [facility] for ENBREL via CoverMyMeds. Will update once we receive a response.  Key: [facility]  ATC patient as she has reached out regarding Enbrel but unsure if regarding PA or some other concern. Left VM advising of update  Chesley Mires, PharmD, MPH, BCPS, CPP Clinical Pharmacist (Rheumatology and Pulmonology)

## 2022-06-30 NOTE — Telephone Encounter (Signed)
Spoke with patient. She said she will be enrolling into Medicare as of 07/14/2022 as she is retiring. She will be enrolling in rx Medicare plan but does not have this information yet.  We will start with getting the [employer] PAP application ready to submit. She will confirm if she is below the $45180 income for household of one with pre-retirement and post-retirement income.  Application has been emailed to her today with draft of financial hardship letter. [email or web address]. Provider portion will be placed in Dr. Fatima Sanger folder today for signature.  We will have to start BIV through new Medicare insurance after 07/14/22  Chesley Mires, PharmD, MPH, BCPS, CPP Clinical Pharmacist (Rheumatology and Pulmonology)

## 2022-06-30 NOTE — Telephone Encounter (Signed)
error 

## 2022-07-07 NOTE — Telephone Encounter (Signed)
Received signed provider portion. Pending pt portion and PA renewal through new insurance plan  Chesley Mires, PharmD, MPH, BCPS, CPP Clinical Pharmacist (Rheumatology and Pulmonology)

## 2022-07-20 ENCOUNTER — Other Ambulatory Visit: Payer: Self-pay

## 2022-07-27 NOTE — Telephone Encounter (Signed)
Received patient forms for [employer], income documents, and letter of appeal for consideration explaining financial situation. Her Medicare Part D will be active on 08/14/22 which is the soonest the PA can be run  Chesley Mires, PharmD, MPH, BCPS, CPP Clinical Pharmacist (Rheumatology and Pulmonology)

## 2022-07-30 ENCOUNTER — Telehealth: Payer: Self-pay | Admitting: *Deleted

## 2022-07-30 NOTE — Telephone Encounter (Signed)
Medication Samples have been provided to the patient.  Drug name: Enbrel Mini       Strength: 50 mg        Qty: 2  LOT: [id number]  Exp.Date: 01/13/2023  Dosing instructions: Inject 50 mg into the skin once weekly.

## 2022-08-17 ENCOUNTER — Other Ambulatory Visit (HOSPITAL_COMMUNITY): Payer: Self-pay

## 2022-08-20 NOTE — Telephone Encounter (Signed)
Submitted a Prior Authorization request to [facility] for ENBREL via CoverMyMeds. Will update once we receive a response.  Key: [id number]

## 2022-08-23 ENCOUNTER — Other Ambulatory Visit (HOSPITAL_COMMUNITY): Payer: Self-pay

## 2022-08-23 ENCOUNTER — Other Ambulatory Visit: Payer: Self-pay | Admitting: Physician Assistant

## 2022-08-23 NOTE — Telephone Encounter (Signed)
Last Fill: 06/23/2022 (Cymbalta), 05/26/2022 (MTX)  Labs: 06/09/2022 CBC and CMP are normal.   Next Visit: 12/07/2022  Last Visit: 06/23/2022  DX: Rheumatoid arthritis involving multiple sites with positive rheumatoid factor GAD (generalized anxiety disorder)   Current Dose per office note 06/23/2022: methotrexate 6 tablets by mouth once weekly, Cymbalta 30 mg 1 capsule by mouth daily   Okay to refill Methotrexate and Cymbalta?

## 2022-08-23 NOTE — Telephone Encounter (Signed)
Received notification from [facility] regarding a prior authorization for ENBREL. Authorization has been APPROVED from 08/20/2022 until further notice. Approval letter sent to scan center.  Per test claim, copay for 28 days supply is $2045.30  Patient can fill through [facility]: [phone]   Authorization # [id number] Phone # [phone]  Submitted Patient Assistance Application to [employer] for ENBREL along with provider portion, patient portion, med list, insurance card copy, PA and income documents. Will update patient when we receive a response.  Phone #: [phone] Fax #: [phone]  Chesley Mires, PharmD, MPH, BCPS, CPP Clinical Pharmacist (Rheumatology and Pulmonology)

## 2022-08-26 ENCOUNTER — Telehealth: Payer: Self-pay | Admitting: *Deleted

## 2022-08-26 NOTE — Telephone Encounter (Signed)
Medication Samples have been provided to the patient.  Drug name: Enbrel Mini       Strength: 50 mg        Qty: 2  LOT: [id number]  Exp.Date: 01/13/2024 and 05/12/2024  Dosing instructions: Inject 50 mg into skin every 7 days.

## 2022-09-07 NOTE — Telephone Encounter (Signed)
Spoke to patient, she said that she spoke to PAP about appeal. Patient submitted more information to them and per rep, they would get back to her with a decision latest some time today. Will follow up with PAP.  Darolyn Rua, PharmD Student [employer]

## 2022-09-09 NOTE — Telephone Encounter (Addendum)
Medication Samples have been provided to the patient.  Drug name: Enbrel mini       Strength: 50mg /mL        Qty: 2  LOT: [id number]  Exp.Date: 05/12/2024  Dosing instructions: Inject 50mg  into the skin once weekly.    Patient is due for labs and I advised patient's daughter that she is due for labs.

## 2022-09-15 ENCOUNTER — Other Ambulatory Visit: Payer: Self-pay

## 2022-09-23 ENCOUNTER — Other Ambulatory Visit: Payer: Self-pay

## 2022-09-23 MED ORDER — ENBREL MINI 50 MG/ML ~~LOC~~ SOCT: 50.0000 mg | SUBCUTANEOUS | Status: DC

## 2022-09-23 NOTE — Addendum Note (Signed)
Addended by: Murrell Redden on: 09/23/2022 08:07 AM   Modules accepted: Orders

## 2022-09-23 NOTE — Telephone Encounter (Signed)
Received a fax from  [employer] regarding an approval for ENBREL patient assistance from 09/22/22 to 03/15/23. Approval letter sent to scan center.  Phone #: [phone] Fax #: [phone]  Chesley Mires, PharmD, MPH, BCPS, CPP Clinical Pharmacist (Rheumatology and Pulmonology)

## 2022-11-25 NOTE — Progress Notes (Signed)
Office Visit Note  Patient: Emma Warner             Date of Birth: 1955-04-12           MRN: [id number]             PCP: Lizbeth Bark, NP Referring: Glenetta Borg Cathleen Corti, NP Visit Date: 12/07/2022 Occupation: @GUAROCC @  Subjective:  Medication management  History of Present Illness: Emma Warner is a 67 y.o. female with seropositive rheumatoid arthritis and osteoarthritis.  Patient was accompanied by her daughter [name].  Patient states that she injured her back in June from lifting an heavy object.  She had a lot of pain and discomfort in her lower back after that which is gradually getting better.  She denies any history of radiculopathy.  She has not had a flare of rheumatoid arthritis since the last visit.  She denies any joint swelling.  She continues to take Enbrel mini 50 mg subcu every week and methotrexate 6 tablets p.o. daily with folic acid 2 mg p.o. daily.  She denies any interruption in the therapy.  She states she is doing much better since she started taking Cymbalta 30 mg p.o. daily.  She believes it is helping her depression and anxiety.  She would like to increase the dose of Cymbalta to 60 mg p.o. daily for anxiety and depression.    Activities of Daily Living:  Patient reports morning stiffness for 15 minutes.   Patient Denies nocturnal pain.  Difficulty dressing/grooming: Denies Difficulty climbing stairs: Denies Difficulty getting out of chair: Denies Difficulty using hands for taps, buttons, cutlery, and/or writing: Denies  Review of Systems  Constitutional:  Negative for fatigue.  HENT:  Negative for mouth sores and mouth dryness.   Eyes:  Negative for dryness.  Respiratory:  Negative for shortness of breath.   Cardiovascular:  Negative for chest pain and palpitations.  Gastrointestinal:  Negative for blood in stool, constipation and diarrhea.  Endocrine: Negative for increased urination.  Genitourinary:  Negative for involuntary urination.   Musculoskeletal:  Positive for morning stiffness. Negative for joint pain, gait problem, joint pain, joint swelling, myalgias, muscle weakness, muscle tenderness and myalgias.  Skin:  Negative for color change, rash, hair loss and sensitivity to sunlight.  Allergic/Immunologic: Negative for susceptible to infections.  Neurological:  Negative for dizziness and headaches.  Hematological:  Negative for swollen glands.  Psychiatric/Behavioral:  Negative for depressed mood and sleep disturbance. The patient is nervous/anxious.     PMFS History:  Patient Active Problem List   Diagnosis Date Noted   Primary osteoarthritis of both hands 07/16/2016   Rheumatoid arthritis involving multiple sites with positive rheumatoid factor (HCC) 07/15/2016   ANA positive 07/15/2016   History of gastroesophageal reflux (GERD) 07/15/2016   Ds DNA antibody positive/ [facility] labs show patient does not have lupus 10/2016 07/15/2016   Other fatigue 07/15/2016   High risk medication use 07/15/2016   Primary insomnia 07/15/2016   Hypertension    Hyperlipidemia    GERD (gastroesophageal reflux disease)    Prediabetes    Vitamin D deficiency    ALLERGIC RHINITIS 12/25/2007   PLEURAL EFFUSION 12/25/2007    Past Medical History:  Diagnosis Date   Arthritis    GERD (gastroesophageal reflux disease)    Hyperlipidemia    Hypertension    Prediabetes    Vitamin D deficiency     Family History  Problem Relation Age of Onset   Diabetes Mother  Rheum arthritis Mother    Cancer Father        colon   Cirrhosis Brother        alcoholic   Alcoholism Brother    Past Surgical History:  Procedure Laterality Date   APPENDECTOMY     Social History   Social History Narrative   Not on file   Immunization History  Administered Date(s) Administered   Influenza,inj,Quad PF,6+ Mos 02/13/2016, 01/04/2017, 12/13/2017   Influenza-Unspecified 12/22/2018   PFIZER(Purple Top)SARS-COV-2 Vaccination 05/21/2019,  06/20/2019, 12/28/2019   Td 03/15/2005   Zoster, Live 05/04/2016     Objective: Vital Signs: BP (!) 141/82 (BP Location: Left Arm, Patient Position: Sitting, Cuff Size: Normal)   Pulse (!) 109   Resp 17   Ht 5\' 7"  (1.702 m)   Wt 197 lb 6.4 oz (89.5 kg)   BMI 30.92 kg/m    Physical Exam Vitals and nursing note reviewed.  Constitutional:      Appearance: She is well-developed.  HENT:     Head: Normocephalic and atraumatic.  Eyes:     Conjunctiva/sclera: Conjunctivae normal.  Cardiovascular:     Rate and Rhythm: Normal rate and regular rhythm.     Heart sounds: Normal heart sounds.  Pulmonary:     Effort: Pulmonary effort is normal.     Breath sounds: Normal breath sounds.  Abdominal:     General: Bowel sounds are normal.     Palpations: Abdomen is soft.  Musculoskeletal:     Cervical back: Normal range of motion.  Lymphadenopathy:     Cervical: No cervical adenopathy.  Skin:    General: Skin is warm and dry.     Capillary Refill: Capillary refill takes less than 2 seconds.  Neurological:     Mental Status: She is alert and oriented to person, place, and time.  Psychiatric:        Behavior: Behavior normal.      Musculoskeletal Exam: Cervical spine was in good range of motion.  Thoracic kyphosis was noted.  She had no point tenderness over thoracic or lumbar spine.  Shoulder joints, elbow joints, wrist joints, MCPs PIPs and DIPs were in good range of motion with no synovitis.  Bilateral PIP and DIP thickening was noted.  Hip joints could not be assessed in the seated position.  Patient did not want to lay down on the table due to her back issues.  Knee joints in good range of motion without any warmth swelling or effusion.  There was no tenderness over ankles or MTPs.  CDAI Exam: CDAI Score: -- Patient Global: 10 / 100; Provider Global: 10 / 100 Swollen: --; Tender: -- Joint Exam 12/07/2022   No joint exam has been documented for this visit   There is currently no  information documented on the homunculus. Go to the Rheumatology activity and complete the homunculus joint exam.  Investigation: No additional findings.  Imaging: No results found.  Recent Labs: Lab Results  Component Value Date   WBC 10.1 06/09/2022   HGB 12.1 06/09/2022   PLT 299 06/09/2022   NA 140 06/09/2022   K 4.6 06/09/2022   CL 104 06/09/2022   CO2 28 06/09/2022   GLUCOSE 93 06/09/2022   BUN 20 06/09/2022   CREATININE 0.73 06/09/2022   BILITOT 0.2 06/09/2022   ALKPHOS 74 10/14/2016   AST 16 06/09/2022   ALT 19 06/09/2022   PROT 7.1 06/09/2022   ALBUMIN 4.4 10/14/2016   CALCIUM 10.0 06/09/2022   GFRAA  88 05/16/2020   QFTBGOLDPLUS NEGATIVE 06/09/2022    Speciality Comments: No specialty comments available.  Procedures:  No procedures performed Allergies: Patient has no known allergies.   Assessment / Plan:     Visit Diagnoses: Rheumatoid arthritis involving multiple sites with positive rheumatoid factor (HCC) -patient denies any flares of rheumatoid arthritis.  She continues to take Enbrel and methotrexate on a regular basis.  No synovitis was noted on the examination.  Plan: etanercept (ENBREL MINI) 50 MG/ML injection, methotrexate (RHEUMATREX) 2.5 MG tablet  High risk medication use - Enbrel Mini 50 mg/ml every 7 days, methotrexate 6 tabs by mouth every 7 days, and folic acid 1 mg 2 tablets daily. -June 09, 2022 CBC and CMP were normal.  TB Gold was negative.  Patient was advised to get labs every 3 months to monitor for drug toxicity.  Includes risk of hepato and renal toxicity was discussed if patient does not get labs on a regular basis.  Patient voiced understanding.  Information on immunization was placed in the AVS.  She is advised to hold Enbrel and methotrexate if she develops an infection resume after the infection resolves.  Plan: etanercept (ENBREL MINI) 50 MG/ML injection, COMPLETE METABOLIC PANEL WITH GFR, CBC with Differential/Platelet  Ds DNA  antibody positive - Ds DNA antibody positive/ [facility] labs show patient does not have lupus 10/2016.  No clinical features of systemic lupus.  Primary osteoarthritis of both hands-she had bilateral PIP and DIP thickening.  No synovitis was noted.  Joint protection muscle strengthening was discussed.  Primary osteoarthritis of both feet-she developed bunions and PIP and DIP thickening with no synovitis.  Chronic midline low back pain without sciatica-patient states she injured her back in June and had lower back pain since then.  She states the pain is improving.  She did not want to lay down on the table today due to the fear of flaring her back again.  Primary insomnia-improved.  GAD (generalized anxiety disorder) -patient states her anxiety  and depression are much better Cymbalta 30 mg 1 capsule by mouth daily.  She wants to increase the dose of Cymbalta to 60 mg p.o. daily.  I advised her that we will give her Cymbalta 30-day supply with 2 refills.  She may want her PCP to go over them depression medication at the follow-up visit.  Side effects of Cymbalta including increased risk of depression, drug interaction and suicidal ideation were discussed.- Plan: DULoxetine (CYMBALTA) 60 MG capsule  Other fatigue-she continues to have some fatigue.  History of osteoporosis - DEXA 03/13/2009 AP lumbar spine T score -3.1 with BMD of 0.707.  D/c fosamax due to SE. patient does not want to take any treatment for osteoporosis.  History of vitamin D deficiency  History of hypertension-blood pressure was elevated at 151/88.  Repeat blood pressure was 141/82.  Patient was advised to monitor blood pressure closely and follow-up with her PCP.  Prediabetes  History of gastroesophageal reflux (GERD)  History of hyperlipidemia  History of pleural effusion /2009 - No recurrence.  Orders: Orders Placed This Encounter  Procedures   COMPLETE METABOLIC PANEL WITH GFR   CBC with Differential/Platelet   Meds  ordered this encounter  Medications   etanercept (ENBREL MINI) 50 MG/ML injection    Sig: Inject 1 mL (50 mg total) into the skin every 7 (seven) days.    Dispense:  12 mL    Refill:  0    Order Specific Question:   Change Pharmacy:  Answer:   [facility] - [place], [place] - [address] [[id number]]   methotrexate (RHEUMATREX) 2.5 MG tablet    Sig: TAKE 6 TABLETS BY MOUTH ONCE PER WEEK. Caution:Chemotherapy. Protect from light.    Dispense:  72 tablet    Refill:  0   DULoxetine (CYMBALTA) 60 MG capsule    Sig: Take 1 capsule (60 mg total) by mouth daily.    Dispense:  30 capsule    Refill:  2     Follow-Up Instructions: Return in about 5 months (around 05/09/2023) for Rheumatoid arthritis, Osteoarthritis, Osteoporosis.   Pollyann Savoy, MD  Note - This record has been created using Animal nutritionist.  Chart creation errors have been sought, but may not always  have been located. Such creation errors do not reflect on  the standard of medical care.

## 2022-12-07 ENCOUNTER — Encounter: Payer: Self-pay | Admitting: Rheumatology

## 2022-12-07 ENCOUNTER — Ambulatory Visit: Payer: Medicare Other | Attending: Rheumatology | Admitting: Rheumatology

## 2022-12-07 VITALS — BP 141/82 | HR 109 | Resp 17 | Ht 67.0 in | Wt 197.4 lb

## 2022-12-07 DIAGNOSIS — M0579 Rheumatoid arthritis with rheumatoid factor of multiple sites without organ or systems involvement: Secondary | ICD-10-CM

## 2022-12-07 DIAGNOSIS — M19041 Primary osteoarthritis, right hand: Secondary | ICD-10-CM | POA: Diagnosis not present

## 2022-12-07 DIAGNOSIS — Z8709 Personal history of other diseases of the respiratory system: Secondary | ICD-10-CM | POA: Diagnosis present

## 2022-12-07 DIAGNOSIS — M19071 Primary osteoarthritis, right ankle and foot: Secondary | ICD-10-CM

## 2022-12-07 DIAGNOSIS — M19072 Primary osteoarthritis, left ankle and foot: Secondary | ICD-10-CM | POA: Insufficient documentation

## 2022-12-07 DIAGNOSIS — M545 Low back pain, unspecified: Secondary | ICD-10-CM

## 2022-12-07 DIAGNOSIS — R7303 Prediabetes: Secondary | ICD-10-CM

## 2022-12-07 DIAGNOSIS — F411 Generalized anxiety disorder: Secondary | ICD-10-CM

## 2022-12-07 DIAGNOSIS — F5101 Primary insomnia: Secondary | ICD-10-CM | POA: Diagnosis present

## 2022-12-07 DIAGNOSIS — R5383 Other fatigue: Secondary | ICD-10-CM

## 2022-12-07 DIAGNOSIS — G8929 Other chronic pain: Secondary | ICD-10-CM | POA: Insufficient documentation

## 2022-12-07 DIAGNOSIS — R768 Other specified abnormal immunological findings in serum: Secondary | ICD-10-CM

## 2022-12-07 DIAGNOSIS — Z8739 Personal history of other diseases of the musculoskeletal system and connective tissue: Secondary | ICD-10-CM

## 2022-12-07 DIAGNOSIS — M19042 Primary osteoarthritis, left hand: Secondary | ICD-10-CM | POA: Diagnosis present

## 2022-12-07 DIAGNOSIS — Z8719 Personal history of other diseases of the digestive system: Secondary | ICD-10-CM

## 2022-12-07 DIAGNOSIS — Z8679 Personal history of other diseases of the circulatory system: Secondary | ICD-10-CM

## 2022-12-07 DIAGNOSIS — Z8639 Personal history of other endocrine, nutritional and metabolic disease: Secondary | ICD-10-CM | POA: Diagnosis present

## 2022-12-07 DIAGNOSIS — Z79899 Other long term (current) drug therapy: Secondary | ICD-10-CM | POA: Diagnosis not present

## 2022-12-07 MED ORDER — METHOTREXATE SODIUM 2.5 MG PO TABS: ORAL_TABLET | ORAL | 0 refills | Status: DC

## 2022-12-07 MED ORDER — DULOXETINE HCL 60 MG PO CPEP: 60.0000 mg | ORAL_CAPSULE | Freq: Every day | ORAL | 2 refills | Status: DC

## 2022-12-07 MED ORDER — ENBREL MINI 50 MG/ML ~~LOC~~ SOCT: 50.0000 mg | SUBCUTANEOUS | 0 refills | Status: DC

## 2022-12-07 NOTE — Patient Instructions (Addendum)
Standing Labs We placed an order today for your standing lab work.   Please have your standing labs drawn in December and every 3 months  Please have your labs drawn 2 weeks prior to your appointment so that the provider can discuss your lab results at your appointment, if possible.  Please note that you may see your imaging and lab results in MyChart before we have reviewed them. We will contact you once all results are reviewed. Please allow our office up to 72 hours to thoroughly review all of the results before contacting the office for clarification of your results.  WALK-IN LAB HOURS  Monday through Thursday from 8:00 am -12:30 pm and 1:00 pm-5:00 pm and Friday from 8:00 am-12:00 pm.  Patients with office visits requiring labs will be seen before walk-in labs.  You may encounter longer than normal wait times. Please allow additional time. Wait times may be shorter on  Monday and Thursday afternoons.  We do not book appointments for walk-in labs. We appreciate your patience and understanding with our staff.   Labs are drawn by [facility]. Please bring your co-pay at the time of your lab draw.  You may receive a bill from [facility] for your lab work.  Please note if you are on Hydroxychloroquine and and an order has been placed for a Hydroxychloroquine level,  you will need to have it drawn 4 hours or more after your last dose.  If you wish to have your labs drawn at another location, please call the office 24 hours in advance so we can fax the orders.  The office is located at [address], [place], [place] [address]   If you have any questions regarding directions or hours of operation,  please call [phone].   As a reminder, please drink plenty of water prior to coming for your lab work. Thanks!   Vaccines You are taking a medication(s) that can suppress your immune system.  The following immunizations are recommended: Flu annually Covid-19  RSV Td/Tdap (tetanus,  diphtheria, pertussis) every 10 years Pneumonia (Prevnar 15 then Pneumovax 23 at least 1 year apart.  Alternatively, can take Prevnar 20 without needing additional dose) Shingrix: 2 doses from 4 weeks to 6 months apart  Please check with your PCP to make sure you are up to date.   If you have signs or symptoms of an infection or start antibiotics: First, call your PCP for workup of your infection. Hold your medication through the infection, until you complete your antibiotics, and until symptoms resolve if you take the following: Injectable medication (Actemra, Benlysta, Cimzia, Cosentyx, Enbrel, Humira, Kevzara, Orencia, Remicade, Simponi, Stelara, Taltz, Tremfya) Methotrexate Leflunomide (Arava) Mycophenolate (Cellcept) Harriette Ohara, Olumiant, or Rinvoq     Please hold methotrexate for 1 week after the vaccination to get a good response from the vaccination.  You do not need to hold Enbrel for vaccination.

## 2022-12-08 LAB — CBC WITH DIFFERENTIAL/PLATELET
Absolute Monocytes: 877 cells/uL (ref 200–950)
Basophils Absolute: 43 cells/uL (ref 0–200)
Basophils Relative: 0.4 %
Eosinophils Absolute: 235 cells/uL (ref 15–500)
Eosinophils Relative: 2.2 %
HCT: 40.4 % (ref 35.0–45.0)
Hemoglobin: 13.2 g/dL (ref 11.7–15.5)
Lymphs Abs: 2996 cells/uL (ref 850–3900)
MCH: 29.4 pg (ref 27.0–33.0)
MCHC: 32.7 g/dL (ref 32.0–36.0)
MCV: 90 fL (ref 80.0–100.0)
MPV: 11 fL (ref 7.5–12.5)
Monocytes Relative: 8.2 %
Neutro Abs: 6548 cells/uL (ref 1500–7800)
Neutrophils Relative %: 61.2 %
Platelets: 302 10*3/uL (ref 140–400)
RBC: 4.49 10*6/uL (ref 3.80–5.10)
RDW: 13.3 % (ref 11.0–15.0)
Total Lymphocyte: 28 %
WBC: 10.7 10*3/uL (ref 3.8–10.8)

## 2022-12-08 LAB — COMPLETE METABOLIC PANEL WITH GFR
AG Ratio: 1.6 (calc) (ref 1.0–2.5)
ALT: 33 U/L — ABNORMAL HIGH (ref 6–29)
AST: 23 U/L (ref 10–35)
Albumin: 4.6 g/dL (ref 3.6–5.1)
Alkaline phosphatase (APISO): 105 U/L (ref 37–153)
BUN: 16 mg/dL (ref 7–25)
CO2: 26 mmol/L (ref 20–32)
Calcium: 10.1 mg/dL (ref 8.6–10.4)
Chloride: 102 mmol/L (ref 98–110)
Creat: 0.81 mg/dL (ref 0.50–1.05)
Globulin: 2.8 g/dL (calc) (ref 1.9–3.7)
Glucose, Bld: 99 mg/dL (ref 65–99)
Potassium: 4 mmol/L (ref 3.5–5.3)
Sodium: 138 mmol/L (ref 135–146)
Total Bilirubin: 0.3 mg/dL (ref 0.2–1.2)
Total Protein: 7.4 g/dL (ref 6.1–8.1)
eGFR: 80 mL/min/{1.73_m2} (ref >=60)

## 2022-12-08 NOTE — Progress Notes (Signed)
CBC and CMP are normal except liver function is mildly elevated.  Patient should avoid all NSAIDs and alcohol use.  We will recheck labs in 3 months.

## 2022-12-11 ENCOUNTER — Other Ambulatory Visit: Payer: Self-pay | Admitting: Physician Assistant

## 2022-12-13 NOTE — Telephone Encounter (Signed)
Last Fill: 03/04/2022  Next Visit: 05/09/2023  Last Visit: 12/07/2022  Dx: Rheumatoid arthritis involving multiple sites with positive rheumatoid factor   Current Dose per office note on 12/07/2022: folic acid 1 mg 2 tablets daily   Okay to refill Folic Acid?

## 2023-05-09 ENCOUNTER — Ambulatory Visit: Payer: Medicare Other | Admitting: Physician Assistant

## 2023-06-20 ENCOUNTER — Other Ambulatory Visit: Payer: Self-pay | Admitting: Family Medicine

## 2023-06-20 DIAGNOSIS — R109 Unspecified abdominal pain: Secondary | ICD-10-CM

## 2023-06-20 NOTE — Telephone Encounter (Signed)
 I called patient, patient needs appt and labs, appt scheduled 06/21/2023.

## 2023-06-21 ENCOUNTER — Encounter: Payer: Self-pay | Admitting: Rheumatology

## 2023-06-21 ENCOUNTER — Ambulatory Visit: Attending: Rheumatology | Admitting: Rheumatology

## 2023-06-21 VITALS — BP 144/80 | HR 118 | Resp 15 | Ht 68.0 in | Wt 193.8 lb

## 2023-06-21 DIAGNOSIS — M0579 Rheumatoid arthritis with rheumatoid factor of multiple sites without organ or systems involvement: Secondary | ICD-10-CM | POA: Insufficient documentation

## 2023-06-21 DIAGNOSIS — M19071 Primary osteoarthritis, right ankle and foot: Secondary | ICD-10-CM | POA: Diagnosis present

## 2023-06-21 DIAGNOSIS — Z8639 Personal history of other endocrine, nutritional and metabolic disease: Secondary | ICD-10-CM | POA: Insufficient documentation

## 2023-06-21 DIAGNOSIS — R5383 Other fatigue: Secondary | ICD-10-CM | POA: Insufficient documentation

## 2023-06-21 DIAGNOSIS — Z8719 Personal history of other diseases of the digestive system: Secondary | ICD-10-CM | POA: Diagnosis present

## 2023-06-21 DIAGNOSIS — G8929 Other chronic pain: Secondary | ICD-10-CM | POA: Diagnosis present

## 2023-06-21 DIAGNOSIS — Z8709 Personal history of other diseases of the respiratory system: Secondary | ICD-10-CM | POA: Insufficient documentation

## 2023-06-21 DIAGNOSIS — M19041 Primary osteoarthritis, right hand: Secondary | ICD-10-CM | POA: Insufficient documentation

## 2023-06-21 DIAGNOSIS — R768 Other specified abnormal immunological findings in serum: Secondary | ICD-10-CM | POA: Diagnosis present

## 2023-06-21 DIAGNOSIS — R7303 Prediabetes: Secondary | ICD-10-CM | POA: Insufficient documentation

## 2023-06-21 DIAGNOSIS — M19072 Primary osteoarthritis, left ankle and foot: Secondary | ICD-10-CM | POA: Insufficient documentation

## 2023-06-21 DIAGNOSIS — Z8679 Personal history of other diseases of the circulatory system: Secondary | ICD-10-CM | POA: Diagnosis present

## 2023-06-21 DIAGNOSIS — Z8739 Personal history of other diseases of the musculoskeletal system and connective tissue: Secondary | ICD-10-CM | POA: Diagnosis present

## 2023-06-21 DIAGNOSIS — F411 Generalized anxiety disorder: Secondary | ICD-10-CM | POA: Diagnosis present

## 2023-06-21 DIAGNOSIS — F5101 Primary insomnia: Secondary | ICD-10-CM | POA: Diagnosis present

## 2023-06-21 DIAGNOSIS — M545 Low back pain, unspecified: Secondary | ICD-10-CM | POA: Insufficient documentation

## 2023-06-21 DIAGNOSIS — M19042 Primary osteoarthritis, left hand: Secondary | ICD-10-CM | POA: Diagnosis present

## 2023-06-21 DIAGNOSIS — Z79899 Other long term (current) drug therapy: Secondary | ICD-10-CM | POA: Insufficient documentation

## 2023-06-21 DIAGNOSIS — R7989 Other specified abnormal findings of blood chemistry: Secondary | ICD-10-CM | POA: Insufficient documentation

## 2023-06-21 MED ORDER — DULOXETINE HCL 60 MG PO CPEP
60.0000 mg | ORAL_CAPSULE | Freq: Every day | ORAL | 2 refills | Status: DC
Start: 2023-06-21 — End: 2023-06-21

## 2023-06-21 NOTE — Patient Instructions (Signed)
 Standing Labs We placed an order today for your standing lab work.   Please have your standing labs drawn in 1 week before your next appointment  Please have your labs drawn 2 weeks prior to your appointment so that the provider can discuss your lab results at your appointment, if possible.  Please note that you may see your imaging and lab results in MyChart before we have reviewed them. We will contact you once all results are reviewed. Please allow our office up to 72 hours to thoroughly review all of the results before contacting the office for clarification of your results.  WALK-IN LAB HOURS  Monday through Thursday from 8:00 am -12:30 pm and 1:00 pm-5:00 pm and Friday from 8:00 am-12:00 pm.  Patients with office visits requiring labs will be seen before walk-in labs.  You may encounter longer than normal wait times. Please allow additional time. Wait times may be shorter on  Monday and Thursday afternoons.  We do not book appointments for walk-in labs. We appreciate your patience and understanding with our staff.   Labs are drawn by [facility]. Please bring your co-pay at the time of your lab draw.  You may receive a bill from [facility] for your lab work.  Please note if you are on Hydroxychloroquine and and an order has been placed for a Hydroxychloroquine level,  you will need to have it drawn 4 hours or more after your last dose.  If you wish to have your labs drawn at another location, please call the office 24 hours in advance so we can fax the orders.  The office is located at [address], [place], [place] [address]   If you have any questions regarding directions or hours of operation,  please call [phone].   As a reminder, please drink plenty of water prior to coming for your lab work. Thanks!

## 2023-06-21 NOTE — Progress Notes (Signed)
 Office Visit Note  Patient: Emma Warner             Date of Birth: Jun 18, 1955           MRN: [id number]             PCP: Lizbeth Bark, NP Referring: Glenetta Borg Cathleen Corti, NP Visit Date: 06/21/2023 Occupation: @GUAROCC @  Subjective:  Medication management  History of Present Illness: JATZIRI GOFFREDO is a 68 y.o. female with seropositive rheumatoid arthritis and osteoarthritis.  She returns today after her last visit in September 2024.  She states that she recently had labs by her PCP and her liver functions were in 200 range last Friday.  She states that she has had upper respiratory tract infection back-to-back since October 2024.  She states she did not get labs since September.  Patient states since her daughter has been living with her she has been drinking alcohol with her daughter.  She  has been drinking alcohol almost every night for the last 6 months.  She was having some epigastric discomfort and is scheduled to have an ultrasound of her gallbladder.  Patient states she stopped drinking alcohol after her liver function results came out.  According to the patient she was taking ibuprofen occasionally but she stopped taking ibuprofen.  She has been out of Enbrel and methotrexate since January 2025.  She states she also ran out of Cymbalta and wants a prescription refill on Cymbalta.  She has not had a flare of rheumatoid arthritis.    Activities of Daily Living:  Patient reports morning stiffness for 5  minute.   Patient Denies nocturnal pain.  Difficulty dressing/grooming: Denies Difficulty climbing stairs: Denies Difficulty getting out of chair: Denies Difficulty using hands for taps, buttons, cutlery, and/or writing: Denies  Review of Systems  Constitutional:  Positive for fatigue.  HENT:  Negative for mouth sores and mouth dryness.   Eyes:  Negative for dryness.  Respiratory:  Negative for shortness of breath.   Cardiovascular:  Negative for chest pain and  palpitations.  Gastrointestinal:  Negative for blood in stool, constipation and diarrhea.  Endocrine: Negative for increased urination.  Genitourinary:  Negative for difficulty urinating.  Musculoskeletal:  Negative for joint pain, gait problem, joint pain, joint swelling, myalgias, muscle tenderness and myalgias.  Skin:  Negative for color change, rash and sensitivity to sunlight.  Allergic/Immunologic: Negative for susceptible to infections.  Neurological:  Negative for dizziness and headaches.  Hematological:  Negative for swollen glands.  Psychiatric/Behavioral:  Negative for depressed mood and sleep disturbance. The patient is nervous/anxious.     PMFS History:  Patient Active Problem List   Diagnosis Date Noted   Primary osteoarthritis of both hands 07/16/2016   Rheumatoid arthritis involving multiple sites with positive rheumatoid factor (HCC) 07/15/2016   ANA positive 07/15/2016   History of gastroesophageal reflux (GERD) 07/15/2016   Ds DNA antibody positive/ [facility] labs show patient does not have lupus 10/2016 07/15/2016   Other fatigue 07/15/2016   High risk medication use 07/15/2016   Primary insomnia 07/15/2016   Hypertension    Hyperlipidemia    GERD (gastroesophageal reflux disease)    Prediabetes    Vitamin D deficiency    ALLERGIC RHINITIS 12/25/2007   PLEURAL EFFUSION 12/25/2007    Past Medical History:  Diagnosis Date   Arthritis    GERD (gastroesophageal reflux disease)    Hyperlipidemia    Hypertension    Prediabetes    Vitamin D deficiency  Family History  Problem Relation Age of Onset   Diabetes Mother    Rheum arthritis Mother    Cancer Father        colon   Cirrhosis Brother        alcoholic   Alcoholism Brother    Past Surgical History:  Procedure Laterality Date   APPENDECTOMY     Social History   Social History Narrative   Not on file   Immunization History  Administered Date(s) Administered   Influenza,inj,Quad PF,6+ Mos  02/13/2016, 01/04/2017, 12/13/2017   Influenza-Unspecified 12/22/2018   PFIZER(Purple Top)SARS-COV-2 Vaccination 05/21/2019, 06/20/2019, 12/28/2019   Td 03/15/2005   Zoster, Live 05/04/2016     Objective: Vital Signs: BP (!) 144/80 (BP Location: Left Arm, Patient Position: Sitting, Cuff Size: Normal)   Pulse (!) 118   Resp 15   Ht 5\' 8"  (1.727 m)   Wt 193 lb 12.8 oz (87.9 kg)   BMI 29.47 kg/m    Physical Exam Vitals and nursing note reviewed.  Constitutional:      Appearance: She is well-developed.  HENT:     Head: Normocephalic and atraumatic.  Eyes:     Conjunctiva/sclera: Conjunctivae normal.  Cardiovascular:     Rate and Rhythm: Normal rate and regular rhythm.     Heart sounds: Normal heart sounds.  Pulmonary:     Effort: Pulmonary effort is normal.     Breath sounds: Normal breath sounds.  Abdominal:     General: Bowel sounds are normal.     Palpations: Abdomen is soft.  Musculoskeletal:     Cervical back: Normal range of motion.  Lymphadenopathy:     Cervical: No cervical adenopathy.  Skin:    General: Skin is warm and dry.     Capillary Refill: Capillary refill takes less than 2 seconds.  Neurological:     Mental Status: She is alert and oriented to person, place, and time.  Psychiatric:        Behavior: Behavior normal.      Musculoskeletal Exam: Cervical spine was good range of motion.  Thoracic kyphosis was noted.  Shoulders, elbows, wrist, MCPs PIPs and DIPs Juengel range of motion with no synovitis.  Bilateral PIP and DIP thickening was noted.  Hip joints were in good range of motion.  Knee joints in good range of motion.  There was no tenderness or swelling over ankles or MTPs.  CDAI Exam: CDAI Score: -- Patient Global: --; Provider Global: -- Swollen: --; Tender: -- Joint Exam 06/21/2023   No joint exam has been documented for this visit   There is currently no information documented on the homunculus. Go to the Rheumatology activity and complete  the homunculus joint exam.  Investigation: No additional findings.  Imaging: No results found.  Recent Labs: Lab Results  Component Value Date   WBC 10.7 12/07/2022   HGB 13.2 12/07/2022   PLT 302 12/07/2022   NA 138 12/07/2022   K 4.0 12/07/2022   CL 102 12/07/2022   CO2 26 12/07/2022   GLUCOSE 99 12/07/2022   BUN 16 12/07/2022   CREATININE 0.81 12/07/2022   BILITOT 0.3 12/07/2022   ALKPHOS 74 10/14/2016   AST 23 12/07/2022   ALT 33 (H) 12/07/2022   PROT 7.4 12/07/2022   ALBUMIN 4.4 10/14/2016   CALCIUM 10.1 12/07/2022   GFRAA 88 05/16/2020   QFTBGOLDPLUS NEGATIVE 06/09/2022   June 17, 2023 AST 228, ALT 249  Speciality Comments: No specialty comments available.  Procedures:  No  procedures performed Allergies: Patient has no known allergies.   Assessment / Plan:     Visit Diagnoses: Rheumatoid arthritis involving multiple sites with positive rheumatoid factor (HCC)-patient has been out of Enbrel and methotrexate since December 2025.  She states she has not had any flares of rheumatoid arthritis.  She could not get prescription refills that she did not get labs since September 2024.  No synovitis was noted on the examination today.  Due to recent elevation of LFTs I advised her to stay off from Enbrel and methotrexate until her LFTs normalize.  High risk medication use - Enbrel Mini 50 mg/ml every 7 days, methotrexate 6 tabs by mouth every 7 days, and folic acid 1 mg 2 tablets daily.  Labs obtained on December 07, 2022 CBC was normal and CMP showed mildly elevated ALT of 33.  Elevated LFTs-patient brought labs from June 17, 2023 AST 228 ALT 249.  I found the lab results on her cell phone.  Patient states these labs were obtained by her PCP during the physical examination.  Patient has been drinking alcohol every night for the last 6 months.  Patient states she stopped drinking alcohol and stopped taking ibuprofen.  She ran out of methotrexate and Enbrel in December.  She  has been off both medications since January.  Patient states that her PCP has ordered ultrasound of her liver and will follow up with her liver functions.  Once her liver functions normalized then we can reinitiate enteral.  I would most likely stay away from methotrexate.  Ds DNA antibody positive - Ds DNA antibody positive/ [facility] labs show patient does not have lupus 10/2016.  No clinical features of systemic lupus.  Primary osteoarthritis of both hands-she has bilateral PIP and DIP thickening with no synovitis.  Primary osteoarthritis of both feet-she has off-and-on discomfort.  With no synovitis.  Chronic midline low back pain without sciatica-she denies any lower back pain today.  Primary insomnia-good sleep hygiene was discussed.  GAD (generalized anxiety disorder) -she was on Cymbalta to 60 mg p.o. daily.  Patient states she ran out of Cymbalta in December.  I will hold off Cymbalta for now.  Regular exercise was advised.  Other fatigue-she continue history of some fatigue.  History of osteoporosis - DEXA 03/13/2009 AP lumbar spine T score -3.1 with BMD of 0.707.  D/c fosamax due to SE. she takes calcium as needed.  Other medical problems are listed as follows:  History of vitamin D deficiency  Prediabetes  History of hypertension-blood pressure was elevated at 144/80.  She was advised to monitor blood pressure closely and follow-up with her PCP.  History of gastroesophageal reflux (GERD)  History of hyperlipidemia  History of pleural effusion /2009 - No recurrence.  GAD (generalized anxiety disorder) -she was on Cymbalta 30 mg 1 capsule by mouth daily. - Plan: DISCONTINUED: DULoxetine (CYMBALTA) 60 MG capsule  Orders: No orders of the defined types were placed in this encounter.  Meds ordered this encounter  Medications   DISCONTD: DULoxetine (CYMBALTA) 60 MG capsule    Sig: Take 1 capsule (60 mg total) by mouth daily.    Dispense:  30 capsule    Refill:  2      Follow-Up Instructions: Return in about 2 months (around 08/21/2023) for Rheumatoid arthritis, Osteoarthritis.   Pollyann Savoy, MD  Note - This record has been created using Animal nutritionist.  Chart creation errors have been sought, but may not always  have been located. Such creation  errors do not reflect on  the standard of medical care.

## 2023-06-24 ENCOUNTER — Ambulatory Visit
Admission: RE | Admit: 2023-06-24 | Discharge: 2023-06-24 | Disposition: A | Discharge status: Home / Self Care | Source: Ambulatory Visit | Attending: Family Medicine | Admitting: Family Medicine

## 2023-06-24 DIAGNOSIS — R109 Unspecified abdominal pain: Secondary | ICD-10-CM

## 2023-08-11 NOTE — Progress Notes (Deleted)
 Office Visit Note  Patient: Emma Warner             Date of Birth: 08-16-1955           MRN: [id number]             PCP: Henry Loge, NP Referring: Smothers, Jeanelle Milch, NP Visit Date: 08/25/2023 Occupation: @GUAROCC @  Subjective:  No chief complaint on file.   History of Present Illness: Emma Warner is a 68 y.o. female ***     Activities of Daily Living:  Patient reports morning stiffness for *** {minute/hour:19697}.   Patient {ACTIONS;DENIES/REPORTS:21021675::"Denies"} nocturnal pain.  Difficulty dressing/grooming: {ACTIONS;DENIES/REPORTS:21021675::"Denies"} Difficulty climbing stairs: {ACTIONS;DENIES/REPORTS:21021675::"Denies"} Difficulty getting out of chair: {ACTIONS;DENIES/REPORTS:21021675::"Denies"} Difficulty using hands for taps, buttons, cutlery, and/or writing: {ACTIONS;DENIES/REPORTS:21021675::"Denies"}  No Rheumatology ROS completed.   PMFS History:  Patient Active Problem List   Diagnosis Date Noted   Primary osteoarthritis of both hands 07/16/2016   Rheumatoid arthritis involving multiple sites with positive rheumatoid factor (HCC) 07/15/2016   ANA positive 07/15/2016   History of gastroesophageal reflux (GERD) 07/15/2016   Ds DNA antibody positive/ [facility] labs show patient does not have lupus 10/2016 07/15/2016   Other fatigue 07/15/2016   High risk medication use 07/15/2016   Primary insomnia 07/15/2016   Hypertension    Hyperlipidemia    GERD (gastroesophageal reflux disease)    Prediabetes    Vitamin D  deficiency    ALLERGIC RHINITIS 12/25/2007   PLEURAL EFFUSION 12/25/2007    Past Medical History:  Diagnosis Date   Arthritis    GERD (gastroesophageal reflux disease)    Hyperlipidemia    Hypertension    Prediabetes    Vitamin D  deficiency     Family History  Problem Relation Age of Onset   Diabetes Mother    Rheum arthritis Mother    Cancer Father        colon   Cirrhosis Brother        alcoholic   Alcoholism Brother     Past Surgical History:  Procedure Laterality Date   APPENDECTOMY     Social History   Social History Narrative   Not on file   Immunization History  Administered Date(s) Administered   Influenza,inj,Quad PF,6+ Mos 02/13/2016, 01/04/2017, 12/13/2017   Influenza-Unspecified 12/22/2018   PFIZER(Purple Top)SARS-COV-2 Vaccination 05/21/2019, 06/20/2019, 12/28/2019   Td 03/15/2005   Zoster, Live 05/04/2016     Objective: Vital Signs: There were no vitals taken for this visit.   Physical Exam   Musculoskeletal Exam: ***  CDAI Exam: CDAI Score: -- Patient Global: --; Provider Global: -- Swollen: --; Tender: -- Joint Exam 08/25/2023   No joint exam has been documented for this visit   There is currently no information documented on the homunculus. Go to the Rheumatology activity and complete the homunculus joint exam.  Investigation: No additional findings.  Imaging: No results found.  Recent Labs: Lab Results  Component Value Date   WBC 10.7 12/07/2022   HGB 13.2 12/07/2022   PLT 302 12/07/2022   NA 138 12/07/2022   K 4.0 12/07/2022   CL 102 12/07/2022   CO2 26 12/07/2022   GLUCOSE 99 12/07/2022   BUN 16 12/07/2022   CREATININE 0.81 12/07/2022   BILITOT 0.3 12/07/2022   ALKPHOS 74 10/14/2016   AST 23 12/07/2022   ALT 33 (H) 12/07/2022   PROT 7.4 12/07/2022   ALBUMIN 4.4 10/14/2016   CALCIUM 10.1 12/07/2022   GFRAA 88 05/16/2020   QFTBGOLDPLUS NEGATIVE 06/09/2022  Speciality Comments: No specialty comments available.  Procedures:  No procedures performed Allergies: Patient has no known allergies.   Assessment / Plan:     Visit Diagnoses: Rheumatoid arthritis involving multiple sites with positive rheumatoid factor (HCC)  High risk medication use  Elevated LFTs  Ds DNA antibody positive  Primary osteoarthritis of both hands  Primary osteoarthritis of both feet  Chronic midline low back pain without sciatica  Primary insomnia  GAD  (generalized anxiety disorder)  Other fatigue  History of osteoporosis  History of vitamin D  deficiency  Prediabetes  History of hypertension  History of gastroesophageal reflux (GERD)  History of hyperlipidemia  History of pleural effusion /2009  Orders: No orders of the defined types were placed in this encounter.  No orders of the defined types were placed in this encounter.   Face-to-face time spent with patient was *** minutes. Greater than 50% of time was spent in counseling and coordination of care.  Follow-Up Instructions: No follow-ups on file.   Romayne Clubs, PA-C  Note - This record has been created using Dragon software.  Chart creation errors have been sought, but may not always  have been located. Such creation errors do not reflect on  the standard of medical care.

## 2023-08-23 NOTE — Progress Notes (Unsigned)
 Office Visit Note  Patient: Emma Warner             Date of Birth: [birth year]           MRN: [id number]             PCP: Henry Loge, NP Referring: Shawn Delay Jeanelle Milch, NP Visit Date: 09/01/2023 Occupation: @GUAROCC @  Subjective:  Discuss medications  History of Present Illness: Emma Warner is a 67 y.o. female with history of seropositive rheumatoid arthritis and osteoarthritis.  She has been off of both Enbrel  and methotrexate  since the end of December 2024.  Patient states that last fall she was having recurrent infections and decided to not renew her refills for these prescriptions.  Patient states that she has been monitoring her symptoms closely since she has been off of these medications and has not noticed any joint swelling or inflammation.  She states that she has started to have increased morning stiffness involving multiple joints. Patient states that in April she had elevation of her liver enzymes.  Patient states that at that time she was having right upper quadrant pain which she attributes to gallstones.  Patient states that at that time she was also drinking alcohol intermittently. Patient states that she has noted some increased depression and anxiety since discontinuing Cymbalta .  She would like to resume Cymbalta  60 mg daily.    Activities of Daily Living:  Patient reports morning stiffness for 15 minutes.   Patient Reports nocturnal pain.  Difficulty dressing/grooming: Denies Difficulty climbing stairs: Denies Difficulty getting out of chair: Denies Difficulty using hands for taps, buttons, cutlery, and/or writing: Denies  Review of Systems  Constitutional:  Positive for fatigue.  HENT:  Negative for mouth sores and mouth dryness.   Eyes:  Negative for dryness.  Respiratory:  Negative for shortness of breath.   Cardiovascular:  Negative for chest pain and palpitations.  Gastrointestinal:  Negative for blood in stool, constipation and diarrhea.   Endocrine: Negative for increased urination.  Genitourinary:  Negative for involuntary urination.  Musculoskeletal:  Positive for myalgias, morning stiffness and myalgias. Negative for joint pain, gait problem, joint pain, joint swelling, muscle weakness and muscle tenderness.  Skin:  Negative for color change, rash, hair loss and sensitivity to sunlight.  Allergic/Immunologic: Negative for susceptible to infections.  Neurological:  Negative for dizziness and headaches.  Hematological:  Negative for swollen glands.  Psychiatric/Behavioral:  Positive for depressed mood and sleep disturbance. The patient is nervous/anxious.     PMFS History:  Patient Active Problem List   Diagnosis Date Noted   Primary osteoarthritis of both hands 07/16/2016   Rheumatoid arthritis involving multiple sites with positive rheumatoid factor (HCC) 07/15/2016   ANA positive 07/15/2016   History of gastroesophageal reflux (GERD) 07/15/2016   Ds DNA antibody positive/ [facility] labs show patient does not have lupus 10/2016 07/15/2016   Other fatigue 07/15/2016   High risk medication use 07/15/2016   Primary insomnia 07/15/2016   Hypertension    Hyperlipidemia    GERD (gastroesophageal reflux disease)    Prediabetes    Vitamin D  deficiency    ALLERGIC RHINITIS 12/25/2007   PLEURAL EFFUSION 12/25/2007    Past Medical History:  Diagnosis Date   Arthritis    GERD (gastroesophageal reflux disease)    Hyperlipidemia    Hypertension    Prediabetes    Vitamin D  deficiency     Family History  Problem Relation Age of Onset   Diabetes Mother  Rheum arthritis Mother    Cancer Father        colon   Cirrhosis Brother        alcoholic   Alcoholism Brother    Past Surgical History:  Procedure Laterality Date   APPENDECTOMY     Social History   Social History Narrative   Not on file   Immunization History  Administered Date(s) Administered   Influenza,inj,Quad PF,6+ Mos 02/13/2016, 01/04/2017,  12/13/2017   Influenza-Unspecified 12/22/2018   PFIZER(Purple Top)SARS-COV-2 Vaccination 05/21/2019, 06/20/2019, 12/28/2019   Td 03/15/2005   Zoster, Live 05/04/2016     Objective: Vital Signs: BP (!) 142/82 (BP Location: Left Arm, Patient Position: Sitting, Cuff Size: Normal)   Pulse (!) 108   Resp 16   Ht 5' 8 (1.727 m)   Wt 191 lb 1.6 oz (86.7 kg)   BMI 29.06 kg/m    Physical Exam Vitals and nursing note reviewed.  Constitutional:      Appearance: She is well-developed.  HENT:     Head: Normocephalic and atraumatic.   Eyes:     Conjunctiva/sclera: Conjunctivae normal.    Cardiovascular:     Rate and Rhythm: Normal rate and regular rhythm.     Heart sounds: Normal heart sounds.  Pulmonary:     Effort: Pulmonary effort is normal.     Breath sounds: Normal breath sounds.  Abdominal:     General: Bowel sounds are normal.     Palpations: Abdomen is soft.   Musculoskeletal:     Cervical back: Normal range of motion.  Lymphadenopathy:     Cervical: No cervical adenopathy.   Skin:    General: Skin is warm and dry.     Capillary Refill: Capillary refill takes less than 2 seconds.   Neurological:     Mental Status: She is alert and oriented to person, place, and time.   Psychiatric:        Behavior: Behavior normal.      Musculoskeletal Exam: C-spine has good range of motion.  Thoracic kyphosis noted.  Shoulder joints, elbow joints, wrist joints, MCPs, PIPs, DIPs a good range of motion with no synovitis.  Synovial thickening of the left wrist.  PIP and DIP thickening.  Hip joints have good range of motion with no groin pain.  Knee joints have good range of motion no warmth or effusion.  Ankle joints have good range of motion without tenderness or joint swelling.  No tenderness or synovitis over MTP joints.  CDAI Exam: CDAI Score: -- Patient Global: --; Provider Global: -- Swollen: --; Tender: -- Joint Exam 09/01/2023   No joint exam has been documented for this  visit   There is currently no information documented on the homunculus. Go to the Rheumatology activity and complete the homunculus joint exam.  Investigation: No additional findings.  Imaging: No results found.  Recent Labs: Lab Results  Component Value Date   WBC 10.6 08/30/2023   HGB 13.0 08/30/2023   PLT 349 08/30/2023   NA 139 08/30/2023   K 4.3 08/30/2023   CL 102 08/30/2023   CO2 27 08/30/2023   GLUCOSE 96 08/30/2023   BUN 12 08/30/2023   CREATININE 0.74 08/30/2023   BILITOT 0.4 08/30/2023   ALKPHOS 74 10/14/2016   AST 15 08/30/2023   ALT 22 08/30/2023   PROT 7.5 08/30/2023   ALBUMIN 4.4 10/14/2016   CALCIUM 9.7 08/30/2023   GFRAA 88 05/16/2020   QFTBGOLDPLUS NEGATIVE 06/09/2022    Speciality Comments: No specialty  comments available.  Procedures:  No procedures performed Allergies: Patient has no known allergies.     Assessment / Plan:     Visit Diagnoses: Rheumatoid arthritis involving multiple sites with positive rheumatoid factor [facility]): Patient presents today experiencing increased morning stiffness and mild discomfort involving multiple joints.  She has been off of both methotrexate  and Enbrel  since the end of December 2024 due to recurrent infections.  She has no synovitis on examination today.  Discussed the option of resuming Enbrel  as monotherapy given history of recurrent infections on combination therapy.  Reviewed indications, contraindications, potential side effects of Enbrel  again today in detail.  Do not plan on resuming methotrexate  at this time due to history of recurrent infections as well as recent elevation of liver enzymes. Plan to reapply for Enbrel  50 mg subcutaneous injections once weekly through her insurance.  Discussed the option of spacing Enbrel  to every 10 days to every 14 days given history of recurrent infections. She was in agreement.  She will notify us  if her symptoms persist or worsen.  She will follow-up in the office in 3  months or sooner if needed.  High risk medication use - Enbrel  Mini 50 mg/ml sq injections every 7 days.  Discontinued methotrexate  in December 2024-do not plan to resume methotrexate  at this time.  CBC and CMP WNL on 08/31/23.  Her next lab work will be due in September and every 3 months. TB gold negative on 06/09/22. Order for TB gold released today.  No recent or recurrent infections.  Discussed the importance of holding enbrel  if she develops signs or symptoms of an infection and to resume once the infection has completely cleared.  - Plan: QuantiFERON-TB Gold Plus  Screening for tuberculosis -Order for TB gold released today.  Plan: QuantiFERON-TB Gold Plus  Elevated LFTs: LFTs were within normal limits on 08/31/23.  Do not plan on resuming methotrexate  at this time.  Ds DNA antibody positive - Ds DNA antibody positive/ [facility] labs show patient does not have lupus 10/2016.  No clinical features of systemic lupus.  Primary osteoarthritis of both hands: CMC joint thickening noted bilaterally.  Synovial thickening of the left wrist noted.  No synovitis was noted on examination today.  She has had a recurrence of morning stiffness involving both hands.  Primary osteoarthritis of both feet: No tenderness or synovitis over MTP joints.  She has good range of motion of both ankle joints with no tenderness or joint swelling.  GAD (generalized anxiety disorder) - She has noticed increased depression and anxiety since discontinuing Cymbalta .  She had previously been tolerating Cymbalta  60 mg daily.  She would like to resume Cymbalta  as prescribed.  Offered to send in 30 mg of Cymbalta  to then titrate to 60 mg daily but she would like to resume 60 mg as prescribed previously.  Other medical conditions are listed as follows:  Other fatigue  Primary insomnia  History of osteoporosis - DEXA 03/13/2009 AP lumbar spine T score -3.1 with BMD of 0.707.  D/c fosamax due to SE. she takes calcium as  needed.  History of vitamin D  deficiency  Prediabetes  History of hypertension: Blood pressure was elevated today in the office and was rechecked prior to leaving.  Patient was advised to monitor blood pressure closely and reach out to PCP if her blood pressure remains elevated.  History of gastroesophageal reflux (GERD)  History of hyperlipidemia  History of pleural effusion /2009    Orders: Orders Placed This Encounter  Procedures   QuantiFERON-TB Gold Plus   Meds ordered this encounter  Medications   DULoxetine  (CYMBALTA ) 60 MG capsule    Sig: Take 1 capsule (60 mg total) by mouth daily.    Dispense:  90 capsule    Refill:  0    Follow-Up Instructions: Return in about 3 months (around 12/02/2023) for Rheumatoid arthritis, Osteoarthritis.   Romayne Clubs, PA-C  Note - This record has been created using Dragon software.  Chart creation errors have been sought, but may not always  have been located. Such creation errors do not reflect on  the standard of medical care.

## 2023-08-25 ENCOUNTER — Ambulatory Visit: Admitting: Physician Assistant

## 2023-08-25 DIAGNOSIS — Z8739 Personal history of other diseases of the musculoskeletal system and connective tissue: Secondary | ICD-10-CM

## 2023-08-25 DIAGNOSIS — Z8709 Personal history of other diseases of the respiratory system: Secondary | ICD-10-CM

## 2023-08-25 DIAGNOSIS — R5383 Other fatigue: Secondary | ICD-10-CM

## 2023-08-25 DIAGNOSIS — R768 Other specified abnormal immunological findings in serum: Secondary | ICD-10-CM

## 2023-08-25 DIAGNOSIS — M19041 Primary osteoarthritis, right hand: Secondary | ICD-10-CM

## 2023-08-25 DIAGNOSIS — R7303 Prediabetes: Secondary | ICD-10-CM

## 2023-08-25 DIAGNOSIS — Z8679 Personal history of other diseases of the circulatory system: Secondary | ICD-10-CM

## 2023-08-25 DIAGNOSIS — R7989 Other specified abnormal findings of blood chemistry: Secondary | ICD-10-CM

## 2023-08-25 DIAGNOSIS — Z8719 Personal history of other diseases of the digestive system: Secondary | ICD-10-CM

## 2023-08-25 DIAGNOSIS — M19071 Primary osteoarthritis, right ankle and foot: Secondary | ICD-10-CM

## 2023-08-25 DIAGNOSIS — F411 Generalized anxiety disorder: Secondary | ICD-10-CM

## 2023-08-25 DIAGNOSIS — G8929 Other chronic pain: Secondary | ICD-10-CM

## 2023-08-25 DIAGNOSIS — Z79899 Other long term (current) drug therapy: Secondary | ICD-10-CM

## 2023-08-25 DIAGNOSIS — M0579 Rheumatoid arthritis with rheumatoid factor of multiple sites without organ or systems involvement: Secondary | ICD-10-CM

## 2023-08-25 DIAGNOSIS — F5101 Primary insomnia: Secondary | ICD-10-CM

## 2023-08-25 DIAGNOSIS — Z8639 Personal history of other endocrine, nutritional and metabolic disease: Secondary | ICD-10-CM

## 2023-08-30 ENCOUNTER — Other Ambulatory Visit: Payer: Self-pay | Admitting: *Deleted

## 2023-08-30 DIAGNOSIS — Z79899 Other long term (current) drug therapy: Secondary | ICD-10-CM

## 2023-08-30 LAB — COMPREHENSIVE METABOLIC PANEL WITH GFR
AG Ratio: 1.3 (calc) (ref 1.0–2.5)
ALT: 22 U/L (ref 6–29)
AST: 15 U/L (ref 10–35)
Albumin: 4.2 g/dL (ref 3.6–5.1)
Alkaline phosphatase (APISO): 110 U/L (ref 37–153)
BUN: 12 mg/dL (ref 7–25)
CO2: 27 mmol/L (ref 20–32)
Calcium: 9.7 mg/dL (ref 8.6–10.4)
Chloride: 102 mmol/L (ref 98–110)
Creat: 0.74 mg/dL (ref 0.50–1.05)
Globulin: 3.3 g/dL (ref 1.9–3.7)
Glucose, Bld: 96 mg/dL (ref 65–99)
Potassium: 4.3 mmol/L (ref 3.5–5.3)
Sodium: 139 mmol/L (ref 135–146)
Total Bilirubin: 0.4 mg/dL (ref 0.2–1.2)
Total Protein: 7.5 g/dL (ref 6.1–8.1)
eGFR: 89 mL/min/{1.73_m2} (ref >=60)

## 2023-08-30 LAB — CBC WITH DIFFERENTIAL/PLATELET
Absolute Lymphocytes: 2449 {cells}/uL (ref 850–3900)
Absolute Monocytes: 689 {cells}/uL (ref 200–950)
Basophils Absolute: 64 {cells}/uL (ref 0–200)
Basophils Relative: 0.6 %
Eosinophils Absolute: 286 {cells}/uL (ref 15–500)
Eosinophils Relative: 2.7 %
HCT: 40.5 % (ref 35.0–45.0)
Hemoglobin: 13 g/dL (ref 11.7–15.5)
MCH: 28.3 pg (ref 27.0–33.0)
MCHC: 32.1 g/dL (ref 32.0–36.0)
MCV: 88.2 fL (ref 80.0–100.0)
MPV: 10.7 fL (ref 7.5–12.5)
Monocytes Relative: 6.5 %
Neutro Abs: 7113 {cells}/uL (ref 1500–7800)
Neutrophils Relative %: 67.1 %
Platelets: 349 10*3/uL (ref 140–400)
RBC: 4.59 10*6/uL (ref 3.80–5.10)
RDW: 12.7 % (ref 11.0–15.0)
Total Lymphocyte: 23.1 %
WBC: 10.6 10*3/uL (ref 3.8–10.8)

## 2023-08-31 ENCOUNTER — Ambulatory Visit: Payer: Self-pay | Admitting: Rheumatology

## 2023-08-31 NOTE — Progress Notes (Signed)
 CBC and CMP are normal.

## 2023-09-01 ENCOUNTER — Ambulatory Visit: Attending: Physician Assistant | Admitting: Physician Assistant

## 2023-09-01 ENCOUNTER — Encounter: Payer: Self-pay | Admitting: Physician Assistant

## 2023-09-01 VITALS — BP 142/82 | HR 108 | Resp 16 | Ht 68.0 in | Wt 191.1 lb

## 2023-09-01 DIAGNOSIS — Z79899 Other long term (current) drug therapy: Secondary | ICD-10-CM | POA: Insufficient documentation

## 2023-09-01 DIAGNOSIS — Z8639 Personal history of other endocrine, nutritional and metabolic disease: Secondary | ICD-10-CM | POA: Insufficient documentation

## 2023-09-01 DIAGNOSIS — Z8709 Personal history of other diseases of the respiratory system: Secondary | ICD-10-CM | POA: Diagnosis present

## 2023-09-01 DIAGNOSIS — G8929 Other chronic pain: Secondary | ICD-10-CM | POA: Insufficient documentation

## 2023-09-01 DIAGNOSIS — M0579 Rheumatoid arthritis with rheumatoid factor of multiple sites without organ or systems involvement: Secondary | ICD-10-CM

## 2023-09-01 DIAGNOSIS — F411 Generalized anxiety disorder: Secondary | ICD-10-CM | POA: Insufficient documentation

## 2023-09-01 DIAGNOSIS — R7989 Other specified abnormal findings of blood chemistry: Secondary | ICD-10-CM | POA: Diagnosis not present

## 2023-09-01 DIAGNOSIS — M545 Low back pain, unspecified: Secondary | ICD-10-CM | POA: Insufficient documentation

## 2023-09-01 DIAGNOSIS — R7303 Prediabetes: Secondary | ICD-10-CM | POA: Diagnosis present

## 2023-09-01 DIAGNOSIS — Z8719 Personal history of other diseases of the digestive system: Secondary | ICD-10-CM | POA: Insufficient documentation

## 2023-09-01 DIAGNOSIS — F5101 Primary insomnia: Secondary | ICD-10-CM | POA: Insufficient documentation

## 2023-09-01 DIAGNOSIS — M19071 Primary osteoarthritis, right ankle and foot: Secondary | ICD-10-CM

## 2023-09-01 DIAGNOSIS — M19042 Primary osteoarthritis, left hand: Secondary | ICD-10-CM | POA: Insufficient documentation

## 2023-09-01 DIAGNOSIS — R768 Other specified abnormal immunological findings in serum: Secondary | ICD-10-CM | POA: Insufficient documentation

## 2023-09-01 DIAGNOSIS — M19072 Primary osteoarthritis, left ankle and foot: Secondary | ICD-10-CM | POA: Insufficient documentation

## 2023-09-01 DIAGNOSIS — Z8739 Personal history of other diseases of the musculoskeletal system and connective tissue: Secondary | ICD-10-CM

## 2023-09-01 DIAGNOSIS — Z8679 Personal history of other diseases of the circulatory system: Secondary | ICD-10-CM

## 2023-09-01 DIAGNOSIS — R5383 Other fatigue: Secondary | ICD-10-CM | POA: Diagnosis present

## 2023-09-01 DIAGNOSIS — Z111 Encounter for screening for respiratory tuberculosis: Secondary | ICD-10-CM | POA: Diagnosis present

## 2023-09-01 DIAGNOSIS — M19041 Primary osteoarthritis, right hand: Secondary | ICD-10-CM | POA: Insufficient documentation

## 2023-09-01 MED ORDER — DULOXETINE HCL 60 MG PO CPEP: 60.0000 mg | ORAL_CAPSULE | Freq: Every day | ORAL | 0 refills | Status: DC

## 2023-09-01 NOTE — Patient Instructions (Signed)
Etanercept Injection What is this medication? ETANERCEPT (et a [employer]) treats autoimmune conditions, such as psoriasis and certain types of arthritis. It works by slowing down an overactive immune system. It belongs to a group of medications called TNF inhibitors. This medicine may be used for other purposes; ask your health care provider or pharmacist if you have questions. COMMON BRAND NAME(S): Enbrel What should I tell my care team before I take this medication? They need to know if you have any of these conditions: Bleeding disorder Cancer Diabetes Granulomatosis with polyangiitis Heart failure HIV or AIDS Immune system problems Infection, such as tuberculosis (TB) or other bacterial, fungal or viral infections Liver disease Nervous system problems, such as Guillain-Barre syndrome, multiple sclerosis or seizures Recent or upcoming vaccine An unusual or allergic reaction to etanercept, other medications, food, dyes, or preservatives Pregnant or trying to get pregnant Breastfeeding How should I use this medication? The medication is injected under the skin. You will be taught how to prepare and give it. Take it as directed on the prescription label. Keep taking it unless your care team tells you stop. This medication comes with INSTRUCTIONS FOR USE. Ask your pharmacist for directions on how to use this medication. Read the information carefully. Talk to your pharmacist or care team if you have questions. If you use a pen, be sure to take off the outer needle cover before using the dose. It is important that you put your used needles and syringes in a special sharps container. Do not put them in a trash can. If you do not have a sharps container, call your pharmacist or care team to get one. A special MedGuide will be given to you by the pharmacist with each prescription and refill. Be sure to read this information carefully each time. Talk to your care team about the use of this  medication in children. While it may be prescribed for children as young as 68 years of age for selected conditions, precautions do apply. Overdosage: If you think you have taken too much of this medicine contact a poison control center or emergency room at once. NOTE: This medicine is only for you. Do not share this medicine with others. What if I miss a dose? If you miss a dose, take it as soon as you can. If it is almost time for your next dose, take only that dose. Do not take double or extra doses. What may interact with this medication? Do not take this medication with any of the following: Biologic medications, such as adalimumab, certolizumab, golimumab, infliximab Live vaccines Rilonacept This medication may also interact with the following: Abatacept [place]akinra Biologic medications, such as anifrolumab, baricitinib, belimumab, canakinumab, natalizumab, rituximab, sarilumab, tocilizumab, tofacitinib, upadacitinib, vedolizumab Cyclophosphamide Sulfasalazine This list may not describe all possible interactions. Give your health care provider a list of all the medicines, herbs, non-prescription drugs, or dietary supplements you use. Also tell them if you smoke, drink alcohol, or use illegal drugs. Some items may interact with your medicine. What should I watch for while using this medication? Visit your care team for regular checks on your progress. Tell your care team if your symptoms do not start to get better or if they get worse. This medication may increase your risk of getting an infection. Call your care team for advice if you get a fever, chills, sore throat, or other symptoms of a cold or flu. Do not treat yourself. Try to avoid being around people who are sick. If  you have not had the measles or chickenpox vaccines, tell your care team right away if you are around someone with these viruses. You will be tested for tuberculosis (TB) before you start this medication. If your care team  prescribes any medication for TB, you should start taking the TB medication before starting this medication. Make sure to finish the full course of TB medication. Avoid taking medications that contain aspirin, acetaminophen, ibuprofen, naproxen, or ketoprofen unless instructed by your care team. These medications may hide fever. Talk to your care team about your risk of cancer. You may be more at risk for certain types of cancer if you take this medication. This medication can decrease the response to a vaccine. If you need to get vaccinated, tell your care team if you have received this medication. Extra booster doses may be needed. Talk to your care team to see if a different vaccination schedule is needed. What side effects may I notice from receiving this medication? Side effects that you should report to your care team as soon as possible: Allergic reactions--skin rash, itching, hives, swelling of the face, lips, tongue, or throat Body pain, tingling, or numbness Eye pain, change in vision, vision loss Heart failure--shortness of breath, swelling of the ankles, feet, or hands, sudden weight gain, unusual weakness or fatigue Infection--fever, chills, cough, sore throat, wounds that don't heal, pain or trouble when passing urine, general feeling of discomfort or being unwell Liver injury--right upper belly pain, loss of appetite, nausea, light-colored stool, dark yellow or brown urine, yellowing skin or eyes, unusual weakness or fatigue Low red blood cell level--unusual weakness or fatigue, dizziness, headache, trouble breathing Lupus-like syndrome--joint pain, swelling, or stiffness, butterfly-shaped rash on the face, rashes that get worse in the sun, fever, unusual weakness or fatigue New or worsening psoriasis--rash with itchy, scaly patches Seizures Unusual bruising or bleeding Weakness in arms and legs Side effects that usually do not require medical attention (report to your care team if  they continue or are bothersome): Headache Pain, redness, or irritation at injection site Sinus pain or pressure around the face or forehead This list may not describe all possible side effects. Call your doctor for medical advice about side effects. You may report side effects to FDA at 1-800-FDA-1088. Where should I keep my medication? Keep out of the reach of children and pets. See product for storage information. Each product may have different instructions. Get rid of any unused medication after the expiration date. To get rid of medications that are no longer needed or have expired: Take the medication to a medication take-back program. Check with your pharmacy or law enforcement to find a location. If you cannot return the medication, ask your pharmacist or care team how to get rid of this medication safely. NOTE: This sheet is a summary. It may not cover all possible information. If you have questions about this medicine, talk to your doctor, pharmacist, or health care provider.  2024 Elsevier/Gold Standard (2021-08-26 00:00:00)

## 2023-09-02 ENCOUNTER — Telehealth: Payer: Self-pay

## 2023-09-02 ENCOUNTER — Other Ambulatory Visit (HOSPITAL_COMMUNITY): Payer: Self-pay

## 2023-09-02 NOTE — Telephone Encounter (Signed)
 Received notification from [employer] regarding a prior authorization for ENBREL . Authorization has been APPROVED from 09/02/2023 until further notice. Approval letter sent to scan center.  Per test claim, copay for 28 days supply is $2000  Patient can fill through [facility]: [phone]   Authorization # [id number]  ATC patient regarding Enbrel  patient assistance program through [employer]. Unable  to reach. Left VM requesting return call  Geraldene Kleine, PharmD, MPH, BCPS, CPP Clinical Pharmacist (Rheumatology and Pulmonology)

## 2023-09-02 NOTE — Telephone Encounter (Addendum)
 Submitted a Prior Authorization request to [facility] for ENBREL  via CoverMyMeds. Pending clinical questions to populate  Key: [id number]   ----- Message from [facility] Jerrold Morgan V sent at 09/01/2023  4:25 PM EDT ----- Can we please reapply for Enbrel  per Jacinta Martinis.

## 2023-09-02 NOTE — Telephone Encounter (Signed)
 Clinicals questions for Enbrel  prior auth completed on [facility]

## 2023-09-05 NOTE — Telephone Encounter (Signed)
 Spoke with patient - she would like Enbrel  [employer] application emailed to her. Confirmed email and have sent to her this morning  Provider form placed in [facility] folder for signature

## 2023-09-06 ENCOUNTER — Ambulatory Visit: Payer: Self-pay | Admitting: Physician Assistant

## 2023-09-06 LAB — QUANTIFERON-TB GOLD PLUS
Mitogen-NIL: 6.31 [IU]/mL
NIL: 0.03 [IU]/mL
QuantiFERON-TB Gold Plus: NEGATIVE
TB1-NIL: 0 [IU]/mL
TB2-NIL: 0 [IU]/mL

## 2023-09-06 NOTE — Progress Notes (Signed)
 TB gold negative

## 2023-09-06 NOTE — Telephone Encounter (Signed)
 Prescriber form received from Taylor Dale, PA-C. Scanned to Onbase. Submission pending return of patient forms

## 2023-09-07 NOTE — Telephone Encounter (Signed)
 Received email from patient stating that her household income is over criteria for [employer]'s patient assistance application  I talked to patient over phone to discuss changes to Medicare this year. She is aware of the $2000 OOP max for rx drugs. She would like to move forward with paying the $2000 up front. She will use [facility].  Scheduled for Enbrel  new start on 09/08/23. Will use sample. She denies any active infection nor upcoming surgeries/procedures  Sherry Pennant, PharmD, MPH, BCPS, CPP Clinical Pharmacist (Rheumatology and Pulmonology)

## 2023-09-07 NOTE — Patient Instructions (Signed)
 Your next ENBREL  dose is due on 09/15/23, 09/22/2023, and every 7 days thereafter  HOLD ENBREL  if you have signs or symptoms of an infection. You can resume once you feel better or back to your baseline. HOLD ENBREL  if you start antibiotics to treat an infection. HOLD ENBREL  around the time of surgery/procedures. Your surgeon will be able to provide recommendations on when to hold BEFORE and when you are cleared to RESUME.  Pharmacy information: Your prescription will be shipped from [facility]. Their phone number is [phone] Emma Warner will call to schedule the first shipment and confirm address. They will mail your medication to your home.  Cost information: There is an out-of-pocket maximum for all prescription drugs for Medicare patients totaling $2000 (this does not include over-the-counter medications, GoodRx coupons, or anything cashed out by the pharmacy)  Labs are due in 1 month then every 3 months. Lab hours are from Monday to Thursday 8am-12:30pm and 1pm-4pm and Friday 8am-12pm. You do not need an appointment if you come for labs during these times. If you'd like to go to a [facility] or [facility] closer to home, please call our clinic 48 hours prior to lab date so we can release orders in a timely manner.  Stay up to date on all routine vaccines: influenza, pneumonia, COVID19, Shingles  How to manage an injection site reaction: Remember the 5 C's: COUNTER - leave on the counter at least 30 minutes but up to overnight to bring medication to room temperature. This may help prevent stinging COLD - place something cold (like an ice gel pack or cold water bottle) on the injection site just before cleansing with alcohol. This may help reduce pain CLARITIN - use Claritin (generic name is loratadine) for the first two weeks of treatment or the day of, the day before, and the day after injecting. This will help to minimize injection site reactions CORTISONE CREAM - apply if  injection site is irritated and itching CALL ME - if injection site reaction is bigger than the size of your fist, looks infected, blisters, or if you develop hives

## 2023-09-07 NOTE — Progress Notes (Unsigned)
 Pharmacy Note  Subjective:   Patient presents to clinic today to receive first dose of Enbrel  for rheumatoid arthritis. Last dose was Dec 2024. She had recurrent infections with Enbrel  and methotrexate   Patient running a fever or have signs/symptoms of infection? No  Patient currently on antibiotics for the treatment of infection? No  Patient have any upcoming invasive procedures/surgeries? No  Objective: CMP     Component Value Date/Time   NA 139 08/30/2023 1553   K 4.3 08/30/2023 1553   CL 102 08/30/2023 1553   CO2 27 08/30/2023 1553   GLUCOSE 96 08/30/2023 1553   BUN 12 08/30/2023 1553   CREATININE 0.74 08/30/2023 1553   CALCIUM 9.7 08/30/2023 1553   PROT 7.5 08/30/2023 1553   ALBUMIN 4.4 10/14/2016 1121   AST 15 08/30/2023 1553   ALT 22 08/30/2023 1553   ALKPHOS 74 10/14/2016 1121   BILITOT 0.4 08/30/2023 1553   GFRNONAA 76 05/16/2020 1441   GFRAA 88 05/16/2020 1441    CBC    Component Value Date/Time   WBC 10.6 08/30/2023 1553   RBC 4.59 08/30/2023 1553   HGB 13.0 08/30/2023 1553   HCT 40.5 08/30/2023 1553   PLT 349 08/30/2023 1553   MCV 88.2 08/30/2023 1553   MCH 28.3 08/30/2023 1553   MCHC 32.1 08/30/2023 1553   RDW 12.7 08/30/2023 1553   LYMPHSABS 2,996 12/07/2022 1557   MONOABS 648 10/14/2016 1210   EOSABS 286 08/30/2023 1553   BASOSABS 64 08/30/2023 1553    Baseline Immunosuppressant Therapy Labs TB GOLD    Latest Ref Rng & Units 09/01/2023    2:55 PM  Quantiferon TB Gold  Quantiferon TB Gold Plus NEGATIVE NEGATIVE    Hepatitis Panel    Latest Ref Rng & Units 07/16/2016    9:39 AM  Hepatitis  Hep B Surface Ag NEGATIVE NEGATIVE   Hep B IgM NON REACTIVE NON REACTIVE    HIV Lab Results  Component Value Date   HIV NONREACTIVE 07/16/2016   Immunoglobulins    Latest Ref Rng & Units 07/16/2016    9:39 AM  Immunoglobulin Electrophoresis  IgG 694 - 1,618 mg/dL 231   IgM 48 - 728 mg/dL 83    SPEP    Latest Ref Rng & Units 08/30/2023    3:53  PM  Serum Protein Electrophoresis  Total Protein 6.1 - 8.1 g/dL 7.5    Chest x-ray: 89/87/7990 - Slight hyperaeration.  No active lung disease.  Assessment/Plan:  Reviewed importance of holding Enbrel  with signs/symptoms of an infections, if antibiotics are prescribed to treat an active infection, and with invasive procedures  Demonstrated proper injection technique with Enbrel  Mini and Autotouch devices. Patient able to demonstrate proper injection technique. She did not need re-trianing with device.  Patient self injected in the left lower abdomen with:  Sample Medication: Enbrel  Mini 50mg /mL cartridge NDC: [address]-03 Lot: [id number] Expiration: 01/13/2024  Sample Medication: Enbrel  Autotouch Reusable Injector Device (patient to keep) NDC: [address]-98 Lot: [id number] Expiration: 11/12/2025  Patient tolerated well.  Observed for 30 mins in office for adverse reaction. Patient denies itchiness and irritation at injection., No swelling or redness noted., and Reviewed injection site reaction management with patient verbally and printed information for review in AVS  Patient is to return in 1 month for labs and 6-8 weeks for follow-up appointment.  Standing orders for CBC/CMP remain in place.  TB gold will be monitored yearly. Lipid panel will be monitored every 6-12 months.  Enbrel  approved through insurance .  Rx sent to: [facility]: [phone] .  Patient provided with pharmacy phone number and advised that [facility] will call to schedule the first shipment to home.  Patient will continue Enbrel  50mg  subcut every 7 days as monotherapy. If recurrent infections, we can consider spacing dosing to every 10-14 days. For now she will take weekly  She will discard Enbrel  Autotouch injector device that she has at home as it is several years old  All questions encouraged and answered.  Instructed patient to call with any further questions or concerns.  Sherry Pennant,  PharmD, MPH, BCPS, CPP Clinical Pharmacist (Rheumatology and Pulmonology)  09/07/2023 10:45 AM

## 2023-09-08 ENCOUNTER — Ambulatory Visit: Attending: Rheumatology | Admitting: Pharmacist

## 2023-09-08 DIAGNOSIS — Z7189 Other specified counseling: Secondary | ICD-10-CM | POA: Diagnosis not present

## 2023-09-08 DIAGNOSIS — M0579 Rheumatoid arthritis with rheumatoid factor of multiple sites without organ or systems involvement: Secondary | ICD-10-CM | POA: Diagnosis not present

## 2023-09-08 DIAGNOSIS — Z79899 Other long term (current) drug therapy: Secondary | ICD-10-CM | POA: Diagnosis not present

## 2023-09-08 MED ORDER — ENBREL MINI 50 MG/ML ~~LOC~~ SOCT
50.0000 mg | SUBCUTANEOUS | 0 refills | Status: DC
  Filled 2023-09-09: qty 4, 28d supply, fill #0
  Filled 2023-10-03: qty 4, 28d supply, fill #1
  Filled 2023-10-26 (×2): qty 4, 28d supply, fill #2

## 2023-09-09 ENCOUNTER — Other Ambulatory Visit: Payer: Self-pay

## 2023-09-09 ENCOUNTER — Other Ambulatory Visit (HOSPITAL_COMMUNITY): Payer: Self-pay

## 2023-09-09 NOTE — Progress Notes (Signed)
 Patient restarting treatment with Enbrel . Last dose was Dec 2024. She had recurrent infections with Enbrel  and methotrexate    Patient will continue Enbrel  50mg  subcut every 7 days as monotherapy. If recurrent infections, we can consider spacing dosing to every 10-14 days. For now she will take weekly  Emma Warner, PharmD, MPH, BCPS, CPP Clinical Pharmacist (Rheumatology and Pulmonology)

## 2023-09-09 NOTE — Progress Notes (Signed)
 Specialty Pharmacy Initial Fill Coordination Note  JENCY SCHNIEDERS is a 68 y.o. female contacted today regarding initial fill of specialty medication(s) Etanercept  (Enbrel  Mini)   Patient requested Delivery   Delivery date: 09/13/23   Verified address: [address]   [place] [address]   Medication will be filled on 6/30.   Patient is aware of $2,000 copayment. Payment info collected and forwarded to [facility] at [facility].

## 2023-09-12 ENCOUNTER — Other Ambulatory Visit: Payer: Self-pay

## 2023-09-13 ENCOUNTER — Other Ambulatory Visit: Payer: Self-pay

## 2023-09-27 ENCOUNTER — Other Ambulatory Visit (HOSPITAL_COMMUNITY): Payer: Self-pay

## 2023-10-03 ENCOUNTER — Other Ambulatory Visit: Payer: Self-pay

## 2023-10-03 NOTE — Progress Notes (Signed)
 Specialty Pharmacy Refill Coordination Note  Emma Warner is a 68 y.o. female contacted today regarding refills of specialty medication(s) Etanercept  (Enbrel  Mini)   Patient requested Delivery   Delivery date: 10/06/23   Verified address: [address]   [place] [address]   Medication will be filled on 10/05/23.

## 2023-10-04 ENCOUNTER — Other Ambulatory Visit: Payer: Self-pay

## 2023-10-05 NOTE — Addendum Note (Signed)
 Addended by: DAYNE SHERRY RAMAN on: 10/05/2023 10:10 AM   Modules accepted: Level of Service

## 2023-10-06 ENCOUNTER — Other Ambulatory Visit: Payer: Self-pay

## 2023-10-06 ENCOUNTER — Encounter (INDEPENDENT_AMBULATORY_CARE_PROVIDER_SITE_OTHER): Payer: Self-pay

## 2023-10-26 ENCOUNTER — Other Ambulatory Visit: Payer: Self-pay

## 2023-10-26 NOTE — Progress Notes (Signed)
 Specialty Pharmacy Refill Coordination Note  Emma Warner is a 68 y.o. female contacted today regarding refills of specialty medication(s) Etanercept  (Enbrel  Mini)   Patient requested Delivery   Delivery date: 11/03/23   Verified address: [address] [address]   Medication will be filled on 11/02/23.

## 2023-11-02 ENCOUNTER — Other Ambulatory Visit: Payer: Self-pay

## 2023-11-22 NOTE — Progress Notes (Deleted)
 Office Visit Note  Patient: Emma Warner             Date of Birth: [birth year]           MRN: [id number]             PCP: Dickey Barnie SAILOR, NP Referring: Smothers, Barnie SAILOR, NP Visit Date: 12/06/2023 Occupation: @GUAROCC @  Subjective:  No chief complaint on file.   History of Present Illness: Emma Warner is a 68 y.o. female ***     Activities of Daily Living:  Patient reports morning stiffness for *** {minute/hour:19697}.   Patient {ACTIONS;DENIES/REPORTS:21021675::Denies} nocturnal pain.  Difficulty dressing/grooming: {ACTIONS;DENIES/REPORTS:21021675::Denies} Difficulty climbing stairs: {ACTIONS;DENIES/REPORTS:21021675::Denies} Difficulty getting out of chair: {ACTIONS;DENIES/REPORTS:21021675::Denies} Difficulty using hands for taps, buttons, cutlery, and/or writing: {ACTIONS;DENIES/REPORTS:21021675::Denies}  No Rheumatology ROS completed.   PMFS History:  Patient Active Problem List   Diagnosis Date Noted   Primary osteoarthritis of both hands 07/16/2016   Rheumatoid arthritis involving multiple sites with positive rheumatoid factor (HCC) 07/15/2016   ANA positive 07/15/2016   History of gastroesophageal reflux (GERD) 07/15/2016   Ds DNA antibody positive/ [facility] labs show patient does not have lupus 10/2016 07/15/2016   Other fatigue 07/15/2016   High risk medication use 07/15/2016   Primary insomnia 07/15/2016   Hypertension    Hyperlipidemia    GERD (gastroesophageal reflux disease)    Prediabetes    Vitamin D  deficiency    ALLERGIC RHINITIS 12/25/2007   PLEURAL EFFUSION 12/25/2007    Past Medical History:  Diagnosis Date   Arthritis    GERD (gastroesophageal reflux disease)    Hyperlipidemia    Hypertension    Prediabetes    Vitamin D  deficiency     Family History  Problem Relation Age of Onset   Diabetes Mother    Rheum arthritis Mother    Cancer Father        colon   Cirrhosis Brother        alcoholic   Alcoholism Brother     Past Surgical History:  Procedure Laterality Date   APPENDECTOMY     Social History   Social History Narrative   Not on file   Immunization History  Administered Date(s) Administered   Influenza,inj,Quad PF,6+ Mos 02/13/2016, 01/04/2017, 12/13/2017   Influenza-Unspecified 12/22/2018   PFIZER(Purple Top)SARS-COV-2 Vaccination 05/21/2019, 06/20/2019, 12/28/2019   Td 03/15/2005   Zoster, Live 05/04/2016     Objective: Vital Signs: There were no vitals taken for this visit.   Physical Exam   Musculoskeletal Exam: ***  CDAI Exam: CDAI Score: -- Patient Global: --; Provider Global: -- Swollen: --; Tender: -- Joint Exam 12/06/2023   No joint exam has been documented for this visit   There is currently no information documented on the homunculus. Go to the Rheumatology activity and complete the homunculus joint exam.  Investigation: No additional findings.  Imaging: No results found.  Recent Labs: Lab Results  Component Value Date   WBC 10.6 08/30/2023   HGB 13.0 08/30/2023   PLT 349 08/30/2023   NA 139 08/30/2023   K 4.3 08/30/2023   CL 102 08/30/2023   CO2 27 08/30/2023   GLUCOSE 96 08/30/2023   BUN 12 08/30/2023   CREATININE 0.74 08/30/2023   BILITOT 0.4 08/30/2023   ALKPHOS 74 10/14/2016   AST 15 08/30/2023   ALT 22 08/30/2023   PROT 7.5 08/30/2023   ALBUMIN 4.4 10/14/2016   CALCIUM 9.7 08/30/2023   GFRAA 88 05/16/2020   QFTBGOLDPLUS NEGATIVE 09/01/2023  Speciality Comments: No specialty comments available.  Procedures:  No procedures performed Allergies: Patient has no known allergies.   Assessment / Plan:     Visit Diagnoses: Rheumatoid arthritis involving multiple sites with positive rheumatoid factor (HCC)  High risk medication use  Elevated LFTs  Ds DNA antibody positive  Primary osteoarthritis of both hands  Primary osteoarthritis of both feet  Chronic midline low back pain without sciatica  Primary insomnia  GAD  (generalized anxiety disorder)  Other fatigue  History of osteoporosis  History of vitamin D  deficiency  Prediabetes  History of hypertension  History of gastroesophageal reflux (GERD)  History of hyperlipidemia  History of pleural effusion /2009  Orders: No orders of the defined types were placed in this encounter.  No orders of the defined types were placed in this encounter.   Face-to-face time spent with patient was *** minutes. Greater than 50% of time was spent in counseling and coordination of care.  Follow-Up Instructions: No follow-ups on file.   Waddell CHRISTELLA Craze, PA-C  Note - This record has been created using Dragon software.  Chart creation errors have been sought, but may not always  have been located. Such creation errors do not reflect on  the standard of medical care.

## 2023-11-25 ENCOUNTER — Other Ambulatory Visit: Payer: Self-pay

## 2023-11-25 ENCOUNTER — Other Ambulatory Visit: Payer: Self-pay | Admitting: Physician Assistant

## 2023-11-25 DIAGNOSIS — Z79899 Other long term (current) drug therapy: Secondary | ICD-10-CM

## 2023-11-25 DIAGNOSIS — M0579 Rheumatoid arthritis with rheumatoid factor of multiple sites without organ or systems involvement: Secondary | ICD-10-CM

## 2023-11-25 MED ORDER — ENBREL MINI 50 MG/ML ~~LOC~~ SOCT
50.0000 mg | SUBCUTANEOUS | 0 refills | Status: DC
  Filled 2023-11-25: qty 4, 28d supply, fill #0

## 2023-11-25 NOTE — Telephone Encounter (Signed)
 Last Fill: 09/08/2023  Labs: 08/30/2023 CBC and CMP are normal.  TB Gold: 09/01/2023 TB gold negative   Next Visit: 12/06/2023  Last Visit: 09/01/2023  IK:Myzlfjunpi arthritis involving multiple sites with positive rheumatoid factor (HCC)   Current Dose per office note 09/01/2023: Enbrel  Mini 50 mg/ml sq injections every 7 days.   Okay to refill Enbrel ?

## 2023-11-25 NOTE — Progress Notes (Signed)
 Specialty Pharmacy Refill Coordination Note  Emma Warner is a 68 y.o. female contacted today regarding refills of specialty medication(s) Etanercept  (Enbrel  Mini)   Patient requested Delivery   Delivery date: 12/01/23   Verified address: [address] [address]   Medication will be filled on 11/30/23.

## 2023-11-30 ENCOUNTER — Other Ambulatory Visit: Payer: Self-pay

## 2023-12-06 ENCOUNTER — Ambulatory Visit: Admitting: Physician Assistant

## 2023-12-06 DIAGNOSIS — R7989 Other specified abnormal findings of blood chemistry: Secondary | ICD-10-CM

## 2023-12-06 DIAGNOSIS — Z8719 Personal history of other diseases of the digestive system: Secondary | ICD-10-CM

## 2023-12-06 DIAGNOSIS — M0579 Rheumatoid arthritis with rheumatoid factor of multiple sites without organ or systems involvement: Secondary | ICD-10-CM

## 2023-12-06 DIAGNOSIS — F411 Generalized anxiety disorder: Secondary | ICD-10-CM

## 2023-12-06 DIAGNOSIS — Z79899 Other long term (current) drug therapy: Secondary | ICD-10-CM

## 2023-12-06 DIAGNOSIS — R5383 Other fatigue: Secondary | ICD-10-CM

## 2023-12-06 DIAGNOSIS — Z8639 Personal history of other endocrine, nutritional and metabolic disease: Secondary | ICD-10-CM

## 2023-12-06 DIAGNOSIS — R7303 Prediabetes: Secondary | ICD-10-CM

## 2023-12-06 DIAGNOSIS — Z8739 Personal history of other diseases of the musculoskeletal system and connective tissue: Secondary | ICD-10-CM

## 2023-12-06 DIAGNOSIS — M19072 Primary osteoarthritis, left ankle and foot: Secondary | ICD-10-CM

## 2023-12-06 DIAGNOSIS — Z8709 Personal history of other diseases of the respiratory system: Secondary | ICD-10-CM

## 2023-12-06 DIAGNOSIS — F5101 Primary insomnia: Secondary | ICD-10-CM

## 2023-12-06 DIAGNOSIS — M545 Low back pain, unspecified: Secondary | ICD-10-CM

## 2023-12-06 DIAGNOSIS — M19041 Primary osteoarthritis, right hand: Secondary | ICD-10-CM

## 2023-12-06 DIAGNOSIS — R768 Other specified abnormal immunological findings in serum: Secondary | ICD-10-CM

## 2023-12-06 DIAGNOSIS — Z8679 Personal history of other diseases of the circulatory system: Secondary | ICD-10-CM

## 2023-12-07 ENCOUNTER — Other Ambulatory Visit: Payer: Self-pay | Admitting: Physician Assistant

## 2023-12-08 NOTE — Telephone Encounter (Signed)
 Last Fill: 09/01/2023  Next Visit: 12/22/2023  Last Visit: 09/01/2023  DX: GAD (generalized anxiety disorder)   Current Dose per office note on 09/01/2023: Cymbalta  60 mg daily.   Okay to refill cymbalta ?

## 2023-12-08 NOTE — Progress Notes (Deleted)
 Office Visit Note  Patient: Emma Warner             Date of Birth: [birth year]           MRN: [id number]             PCP: Dickey Barnie SAILOR, NP Referring: Smothers, Barnie SAILOR, NP Visit Date: 12/22/2023 Occupation: Data Unavailable  Subjective:  No chief complaint on file.   History of Present Illness: Emma Warner is a 68 y.o. female ***     Activities of Daily Living:  Patient reports morning stiffness for *** {minute/hour:19697}.   Patient {ACTIONS;DENIES/REPORTS:21021675::Denies} nocturnal pain.  Difficulty dressing/grooming: {ACTIONS;DENIES/REPORTS:21021675::Denies} Difficulty climbing stairs: {ACTIONS;DENIES/REPORTS:21021675::Denies} Difficulty getting out of chair: {ACTIONS;DENIES/REPORTS:21021675::Denies} Difficulty using hands for taps, buttons, cutlery, and/or writing: {ACTIONS;DENIES/REPORTS:21021675::Denies}  No Rheumatology ROS completed.   PMFS History:  Patient Active Problem List   Diagnosis Date Noted   Primary osteoarthritis of both hands 07/16/2016   Rheumatoid arthritis involving multiple sites with positive rheumatoid factor (HCC) 07/15/2016   ANA positive 07/15/2016   History of gastroesophageal reflux (GERD) 07/15/2016   Ds DNA antibody positive/ [facility] labs show patient does not have lupus 10/2016 07/15/2016   Other fatigue 07/15/2016   High risk medication use 07/15/2016   Primary insomnia 07/15/2016   Hypertension    Hyperlipidemia    GERD (gastroesophageal reflux disease)    Prediabetes    Vitamin D  deficiency    ALLERGIC RHINITIS 12/25/2007   PLEURAL EFFUSION 12/25/2007    Past Medical History:  Diagnosis Date   Arthritis    GERD (gastroesophageal reflux disease)    Hyperlipidemia    Hypertension    Prediabetes    Vitamin D  deficiency     Family History  Problem Relation Age of Onset   Diabetes Mother    Rheum arthritis Mother    Cancer Father        colon   Cirrhosis Brother        alcoholic   Alcoholism  Brother    Past Surgical History:  Procedure Laterality Date   APPENDECTOMY     Social History   Tobacco Use   Smoking status: Former    Current packs/day: 0.00    Average packs/day: 1 pack/day for 40.0 years (40.0 ttl pk-yrs)    Types: Cigarettes    Start date: 03/15/1974    Quit date: 03/15/2014    Years since quitting: 9.7    Passive exposure: Never   Smokeless tobacco: Never  Vaping Use   Vaping status: Former  Substance Use Topics   Alcohol use: Not Currently   Drug use: No   Social History   Social History Narrative   Not on file     Immunization History  Administered Date(s) Administered   Influenza,inj,Quad PF,6+ Mos 02/13/2016, 01/04/2017, 12/13/2017   Influenza-Unspecified 12/22/2018   PFIZER(Purple Top)SARS-COV-2 Vaccination 05/21/2019, 06/20/2019, 12/28/2019   Td 03/15/2005   Zoster, Live 05/04/2016     Objective: Vital Signs: There were no vitals taken for this visit.   Physical Exam   Musculoskeletal Exam: ***  CDAI Exam: CDAI Score: -- Patient Global: --; Provider Global: -- Swollen: --; Tender: -- Joint Exam 12/22/2023   No joint exam has been documented for this visit   There is currently no information documented on the homunculus. Go to the Rheumatology activity and complete the homunculus joint exam.  Investigation: No additional findings.  Imaging: No results found.  Recent Labs: Lab Results  Component Value Date   WBC  10.6 08/30/2023   HGB 13.0 08/30/2023   PLT 349 08/30/2023   NA 139 08/30/2023   K 4.3 08/30/2023   CL 102 08/30/2023   CO2 27 08/30/2023   GLUCOSE 96 08/30/2023   BUN 12 08/30/2023   CREATININE 0.74 08/30/2023   BILITOT 0.4 08/30/2023   ALKPHOS 74 10/14/2016   AST 15 08/30/2023   ALT 22 08/30/2023   PROT 7.5 08/30/2023   ALBUMIN 4.4 10/14/2016   CALCIUM 9.7 08/30/2023   GFRAA 88 05/16/2020   QFTBGOLDPLUS NEGATIVE 09/01/2023    Speciality Comments: No specialty comments available.  Procedures:   No procedures performed Allergies: Patient has no known allergies.   Assessment / Plan:     Visit Diagnoses: No diagnosis found.  Orders: No orders of the defined types were placed in this encounter.  No orders of the defined types were placed in this encounter.   Face-to-face time spent with patient was *** minutes. Greater than 50% of time was spent in counseling and coordination of care.  Follow-Up Instructions: No follow-ups on file.   Emma Warner, RT  Note - This record has been created using [employer].  Chart creation errors have been sought, but may not always  have been located. Such creation errors do not reflect on  the standard of medical care.

## 2023-12-12 ENCOUNTER — Other Ambulatory Visit: Payer: Self-pay | Admitting: *Deleted

## 2023-12-12 DIAGNOSIS — Z79899 Other long term (current) drug therapy: Secondary | ICD-10-CM

## 2023-12-12 LAB — COMPREHENSIVE METABOLIC PANEL WITH GFR
AG Ratio: 1.3 (calc) (ref 1.0–2.5)
ALT: 39 U/L — ABNORMAL HIGH (ref 6–29)
AST: 33 U/L (ref 10–35)
Albumin: 4.3 g/dL (ref 3.6–5.1)
Alkaline phosphatase (APISO): 116 U/L (ref 37–153)
BUN: 14 mg/dL (ref 7–25)
CO2: 26 mmol/L (ref 20–32)
Calcium: 9.4 mg/dL (ref 8.6–10.4)
Chloride: 102 mmol/L (ref 98–110)
Creat: 0.95 mg/dL (ref 0.50–1.05)
Globulin: 3.2 g/dL (ref 1.9–3.7)
Glucose, Bld: 107 mg/dL — ABNORMAL HIGH (ref 65–99)
Potassium: 4.1 mmol/L (ref 3.5–5.3)
Sodium: 139 mmol/L (ref 135–146)
Total Bilirubin: 0.5 mg/dL (ref 0.2–1.2)
Total Protein: 7.5 g/dL (ref 6.1–8.1)
eGFR: 65 mL/min/1.73m2 (ref >=60)

## 2023-12-12 LAB — CBC WITH DIFFERENTIAL/PLATELET
Absolute Lymphocytes: 2657 {cells}/uL (ref 850–3900)
Absolute Monocytes: 672 {cells}/uL (ref 200–950)
Basophils Absolute: 49 {cells}/uL (ref 0–200)
Basophils Relative: 0.6 %
Eosinophils Absolute: 170 {cells}/uL (ref 15–500)
Eosinophils Relative: 2.1 %
HCT: 42.8 % (ref 35.0–45.0)
Hemoglobin: 14.1 g/dL (ref 11.7–15.5)
MCH: 30 pg (ref 27.0–33.0)
MCHC: 32.9 g/dL (ref 32.0–36.0)
MCV: 91.1 fL (ref 80.0–100.0)
MPV: 10.7 fL (ref 7.5–12.5)
Monocytes Relative: 8.3 %
Neutro Abs: 4552 {cells}/uL (ref 1500–7800)
Neutrophils Relative %: 56.2 %
Platelets: 318 Thousand/uL (ref 140–400)
RBC: 4.7 Million/uL (ref 3.80–5.10)
RDW: 13.2 % (ref 11.0–15.0)
Total Lymphocyte: 32.8 %
WBC: 8.1 Thousand/uL (ref 3.8–10.8)

## 2023-12-12 NOTE — Progress Notes (Deleted)
 Office Visit Note  Patient: Emma Warner             Date of Birth: [birth year]           MRN: [id number]             PCP: Dickey Barnie SAILOR, NP Referring: Dickey Barnie SAILOR, NP Visit Date: 12/13/2023 Occupation: @GUAROCC @  Subjective:    History of Present Illness: Emma Warner is a 68 y.o. female with history of rheumatoid arthritis.  Patient remains on  Enbrel  50 mg sq injections once weekly.    CBC and CMP updated on 08/30/23. Orders for CBC and CMP updated today.  TB gold negative on 09/01/23. Lipid panel updated today Discussed the importance of holding enbrel  if she develops signs or symptoms of an infection and to resume once the infection has completely cleared.   Activities of Daily Living:  Patient reports morning stiffness for *** {minute/hour:19697}.   Patient {ACTIONS;DENIES/REPORTS:21021675::Denies} nocturnal pain.  Difficulty dressing/grooming: {ACTIONS;DENIES/REPORTS:21021675::Denies} Difficulty climbing stairs: {ACTIONS;DENIES/REPORTS:21021675::Denies} Difficulty getting out of chair: {ACTIONS;DENIES/REPORTS:21021675::Denies} Difficulty using hands for taps, buttons, cutlery, and/or writing: {ACTIONS;DENIES/REPORTS:21021675::Denies}  Review of Systems  Constitutional:  Negative for fatigue.  HENT:  Negative for mouth sores, mouth dryness and nose dryness.   Eyes:  Negative for pain, visual disturbance and dryness.  Respiratory:  Negative for cough, hemoptysis, shortness of breath and difficulty breathing.   Cardiovascular:  Negative for chest pain, palpitations, hypertension and swelling in legs/feet.  Gastrointestinal:  Negative for blood in stool, constipation and diarrhea.  Endocrine: Negative for increased urination.  Genitourinary:  Negative for painful urination.  Musculoskeletal:  Negative for joint pain, joint pain, joint swelling, myalgias, muscle weakness, morning stiffness, muscle tenderness and myalgias.  Skin:  Negative for color  change, pallor, rash, hair loss, nodules/bumps, skin tightness, ulcers and sensitivity to sunlight.  Allergic/Immunologic: Negative for susceptible to infections.  Neurological:  Negative for dizziness, numbness, headaches and weakness.  Hematological:  Negative for swollen glands.  Psychiatric/Behavioral:  Negative for depressed mood and sleep disturbance. The patient is not nervous/anxious.     PMFS History:  Patient Active Problem List   Diagnosis Date Noted   Primary osteoarthritis of both hands 07/16/2016   Rheumatoid arthritis involving multiple sites with positive rheumatoid factor (HCC) 07/15/2016   ANA positive 07/15/2016   History of gastroesophageal reflux (GERD) 07/15/2016   Ds DNA antibody positive/ [facility] labs show patient does not have lupus 10/2016 07/15/2016   Other fatigue 07/15/2016   High risk medication use 07/15/2016   Primary insomnia 07/15/2016   Hypertension    Hyperlipidemia    GERD (gastroesophageal reflux disease)    Prediabetes    Vitamin D  deficiency    ALLERGIC RHINITIS 12/25/2007   PLEURAL EFFUSION 12/25/2007    Past Medical History:  Diagnosis Date   Arthritis    GERD (gastroesophageal reflux disease)    Hyperlipidemia    Hypertension    Prediabetes    Vitamin D  deficiency     Family History  Problem Relation Age of Onset   Diabetes Mother    Rheum arthritis Mother    Cancer Father        colon   Cirrhosis Brother        alcoholic   Alcoholism Brother    Past Surgical History:  Procedure Laterality Date   APPENDECTOMY     Social History   Social History Narrative   Not on file   Immunization History  Administered Date(s) Administered  Influenza,inj,Quad PF,6+ Mos 02/13/2016, 01/04/2017, 12/13/2017   Influenza-Unspecified 12/22/2018   PFIZER(Purple Top)SARS-COV-2 Vaccination 05/21/2019, 06/20/2019, 12/28/2019   Td 03/15/2005   Zoster, Live 05/04/2016     Objective: Vital Signs: There were no vitals taken for this visit.    Physical Exam Vitals and nursing note reviewed.  Constitutional:      Appearance: She is well-developed.  HENT:     Head: Normocephalic and atraumatic.  Eyes:     Conjunctiva/sclera: Conjunctivae normal.  Cardiovascular:     Rate and Rhythm: Normal rate and regular rhythm.     Heart sounds: Normal heart sounds.  Pulmonary:     Effort: Pulmonary effort is normal.     Breath sounds: Normal breath sounds.  Abdominal:     General: Bowel sounds are normal.     Palpations: Abdomen is soft.  Musculoskeletal:     Cervical back: Normal range of motion.  Lymphadenopathy:     Cervical: No cervical adenopathy.  Skin:    General: Skin is warm and dry.     Capillary Refill: Capillary refill takes less than 2 seconds.  Neurological:     Mental Status: She is alert and oriented to person, place, and time.  Psychiatric:        Behavior: Behavior normal.      Musculoskeletal Exam: ***  CDAI Exam: CDAI Score: -- Patient Global: --; Provider Global: -- Swollen: --; Tender: -- Joint Exam 12/13/2023   No joint exam has been documented for this visit   There is currently no information documented on the homunculus. Go to the Rheumatology activity and complete the homunculus joint exam.  Investigation: No additional findings.  Imaging: No results found.  Recent Labs: Lab Results  Component Value Date   WBC 10.6 08/30/2023   HGB 13.0 08/30/2023   PLT 349 08/30/2023   NA 139 08/30/2023   K 4.3 08/30/2023   CL 102 08/30/2023   CO2 27 08/30/2023   GLUCOSE 96 08/30/2023   BUN 12 08/30/2023   CREATININE 0.74 08/30/2023   BILITOT 0.4 08/30/2023   ALKPHOS 74 10/14/2016   AST 15 08/30/2023   ALT 22 08/30/2023   PROT 7.5 08/30/2023   ALBUMIN 4.4 10/14/2016   CALCIUM 9.7 08/30/2023   GFRAA 88 05/16/2020   QFTBGOLDPLUS NEGATIVE 09/01/2023    Speciality Comments: No specialty comments available.  Procedures:  No procedures performed Allergies: Patient has no known  allergies.   Assessment / Plan:     Visit Diagnoses: Rheumatoid arthritis involving multiple sites with positive rheumatoid factor (HCC)  High risk medication use  Elevated LFTs  Ds DNA antibody positive  Primary osteoarthritis of both hands  Primary osteoarthritis of both feet  Chronic midline low back pain without sciatica  Primary insomnia  GAD (generalized anxiety disorder)  Other fatigue  History of osteoporosis  History of vitamin D  deficiency  Prediabetes  History of hypertension  History of gastroesophageal reflux (GERD)  History of hyperlipidemia  History of pleural effusion /2009  Orders: No orders of the defined types were placed in this encounter.  No orders of the defined types were placed in this encounter.   Face-to-face time spent with patient was *** minutes. Greater than 50% of time was spent in counseling and coordination of care.  Follow-Up Instructions: No follow-ups on file.   Waddell CHRISTELLA Craze, PA-C  Note - This record has been created using Dragon software.  Chart creation errors have been sought, but may not always  have been located.  Such creation errors do not reflect on  the standard of medical care.

## 2023-12-13 ENCOUNTER — Ambulatory Visit: Payer: Self-pay | Admitting: Rheumatology

## 2023-12-13 ENCOUNTER — Ambulatory Visit: Admitting: Physician Assistant

## 2023-12-13 DIAGNOSIS — M19072 Primary osteoarthritis, left ankle and foot: Secondary | ICD-10-CM

## 2023-12-13 DIAGNOSIS — G8929 Other chronic pain: Secondary | ICD-10-CM

## 2023-12-13 DIAGNOSIS — Z8739 Personal history of other diseases of the musculoskeletal system and connective tissue: Secondary | ICD-10-CM

## 2023-12-13 DIAGNOSIS — R7989 Other specified abnormal findings of blood chemistry: Secondary | ICD-10-CM

## 2023-12-13 DIAGNOSIS — R7303 Prediabetes: Secondary | ICD-10-CM

## 2023-12-13 DIAGNOSIS — R768 Other specified abnormal immunological findings in serum: Secondary | ICD-10-CM

## 2023-12-13 DIAGNOSIS — Z8719 Personal history of other diseases of the digestive system: Secondary | ICD-10-CM

## 2023-12-13 DIAGNOSIS — F5101 Primary insomnia: Secondary | ICD-10-CM

## 2023-12-13 DIAGNOSIS — F411 Generalized anxiety disorder: Secondary | ICD-10-CM

## 2023-12-13 DIAGNOSIS — R5383 Other fatigue: Secondary | ICD-10-CM

## 2023-12-13 DIAGNOSIS — Z79899 Other long term (current) drug therapy: Secondary | ICD-10-CM

## 2023-12-13 DIAGNOSIS — M0579 Rheumatoid arthritis with rheumatoid factor of multiple sites without organ or systems involvement: Secondary | ICD-10-CM

## 2023-12-13 DIAGNOSIS — Z8679 Personal history of other diseases of the circulatory system: Secondary | ICD-10-CM

## 2023-12-13 DIAGNOSIS — Z8709 Personal history of other diseases of the respiratory system: Secondary | ICD-10-CM

## 2023-12-13 DIAGNOSIS — Z8639 Personal history of other endocrine, nutritional and metabolic disease: Secondary | ICD-10-CM

## 2023-12-13 DIAGNOSIS — M19041 Primary osteoarthritis, right hand: Secondary | ICD-10-CM

## 2023-12-13 NOTE — Progress Notes (Signed)
 CBC and CMP normal.  Liver function is mildly elevated.  Patient should avoid all NSAIDs and alcohol use.

## 2023-12-14 NOTE — Progress Notes (Signed)
 Office Visit Note  Patient: Emma Warner             Date of Birth: 09/15/1955           MRN: [id number]             PCP: Dickey Barnie SAILOR, NP Referring: Dickey Barnie SAILOR, NP Visit Date: 12/20/2023 Occupation: Data Unavailable  Subjective:  Medication management  History of Present Illness: Emma Warner is a 68 y.o. female with seropositive rheumatoid arthritis and osteoarthritis.  She returns today after her last visit in June 2025.  She denies having any joint swelling.  She has morning stiffness lasting for about 5 minutes.  She has been taking Enbrel  50 mg subcu weekly without any interruption since the last visit.  She has been off methotrexate  since December 2024.  She denies any eye inflammation or shortness of breath.  She quit drinking alcohol about 2-1/2 months ago.  She recently had some alcohol while she was at the beach.  She states Cymbalta  has been very helpful in reducing discomfort.  It helps with her anxiety and insomnia. She had menopause in her mid 9s.  She recalls having postmenopausal symptoms and taking hormones for about 1 year.  She takes calcium with vitamin D .  She walks about 1 mile daily.  She also enjoys swimming.  She does not do any resistive exercises.  She denies use of thyroid medications, antiepileptics and antacids.  There is no family history of osteoporosis or femur fracture in her mother.  She reports only half an inch of height loss.    Activities of Daily Living:  Patient reports morning stiffness for 5  minutes.   Patient Denies nocturnal pain.  Difficulty dressing/grooming: Denies Difficulty climbing stairs: Denies Difficulty getting out of chair: Denies Difficulty using hands for taps, buttons, cutlery, and/or writing: Denies  Review of Systems  Constitutional:  Positive for fatigue.  HENT:  Negative for mouth sores and mouth dryness.   Eyes:  Negative for dryness.  Respiratory:  Negative for difficulty breathing.    Cardiovascular:  Negative for chest pain and palpitations.  Gastrointestinal:  Negative for blood in stool, constipation and diarrhea.  Endocrine: Negative for increased urination.  Genitourinary:  Negative for decreased urine output.  Musculoskeletal:  Positive for joint pain, joint pain and morning stiffness. Negative for joint swelling, myalgias and myalgias.  Skin:  Negative for color change, rash and sensitivity to sunlight.  Allergic/Immunologic: Negative for susceptible to infections.  Neurological:  Negative for headaches.  Hematological:  Negative for swollen glands.  Psychiatric/Behavioral:  Negative for depressed mood and sleep disturbance. The patient is nervous/anxious.     PMFS History:  Patient Active Problem List   Diagnosis Date Noted   Primary osteoarthritis of both hands 07/16/2016   Rheumatoid arthritis involving multiple sites with positive rheumatoid factor (HCC) 07/15/2016   ANA positive 07/15/2016   History of gastroesophageal reflux (GERD) 07/15/2016   Ds DNA antibody positive/ [facility] labs show patient does not have lupus 10/2016 07/15/2016   Other fatigue 07/15/2016   High risk medication use 07/15/2016   Primary insomnia 07/15/2016   Hypertension    Hyperlipidemia    GERD (gastroesophageal reflux disease)    Prediabetes    Vitamin D  deficiency    ALLERGIC RHINITIS 12/25/2007   PLEURAL EFFUSION 12/25/2007    Past Medical History:  Diagnosis Date   Arthritis    GERD (gastroesophageal reflux disease)    Hyperlipidemia    Hypertension  Prediabetes    Vitamin D  deficiency     Family History  Problem Relation Age of Onset   Diabetes Mother    Rheum arthritis Mother    Cancer Father        colon   Cirrhosis Brother        alcoholic   Alcoholism Brother    Past Surgical History:  Procedure Laterality Date   APPENDECTOMY     Social History   Tobacco Use   Smoking status: Former    Current packs/day: 0.00    Average packs/day: 1 pack/day  for 40.0 years (40.0 ttl pk-yrs)    Types: Cigarettes    Start date: 03/15/1974    Quit date: 03/15/2014    Years since quitting: 9.7    Passive exposure: Never   Smokeless tobacco: Never  Vaping Use   Vaping status: Former  Substance Use Topics   Alcohol use: Not Currently   Drug use: No   Social History   Social History Narrative   Not on file     Immunization History  Administered Date(s) Administered   Influenza,inj,Quad PF,6+ Mos 02/13/2016, 01/04/2017, 12/13/2017   Influenza-Unspecified 12/22/2018   PFIZER(Purple Top)SARS-COV-2 Vaccination 05/21/2019, 06/20/2019, 12/28/2019   Td 03/15/2005   Zoster, Live 05/04/2016     Objective: Vital Signs: BP (!) 148/90   Pulse (!) 106   Temp 98.1 F (36.7 C)   Resp 14   Ht 5' 7 (1.702 m)   Wt 199 lb 9.6 oz (90.5 kg)   BMI 31.26 kg/m    Physical Exam Vitals and nursing note reviewed.  Constitutional:      Appearance: She is well-developed.  HENT:     Head: Normocephalic and atraumatic.  Eyes:     Conjunctiva/sclera: Conjunctivae normal.  Cardiovascular:     Rate and Rhythm: Normal rate and regular rhythm.     Heart sounds: Normal heart sounds.  Pulmonary:     Effort: Pulmonary effort is normal.     Breath sounds: Normal breath sounds.  Abdominal:     General: Bowel sounds are normal.     Palpations: Abdomen is soft.  Musculoskeletal:     Cervical back: Normal range of motion.  Lymphadenopathy:     Cervical: No cervical adenopathy.  Skin:    General: Skin is warm and dry.     Capillary Refill: Capillary refill takes less than 2 seconds.  Neurological:     Mental Status: She is alert and oriented to person, place, and time.  Psychiatric:        Behavior: Behavior normal.      Musculoskeletal Exam: Cervical, thoracic and lumbar spine were in good range of motion.  Thoracic kyphosis was noted.  There was no SI joint tenderness.  Shoulder joints, elbow joints, wrist joints, MCPs, PIPs and DIPs were in good range  of motion with no synovitis.  PIP and DIP prominence was noted.  Hip joints and knee joints were in good range of motion without any warmth swelling or effusion.  There was no tenderness over ankles or MTPs.   CDAI Exam: CDAI Score: -- Patient Global: 0 / 100; Provider Global: 0 / 100 Swollen: --; Tender: -- Joint Exam 12/20/2023   No joint exam has been documented for this visit   There is currently no information documented on the homunculus. Go to the Rheumatology activity and complete the homunculus joint exam.  Investigation: No additional findings.  Imaging: No results found.  Recent Labs: Lab Results  Component Value Date   WBC 8.1 12/12/2023   HGB 14.1 12/12/2023   PLT 318 12/12/2023   NA 139 12/12/2023   K 4.1 12/12/2023   CL 102 12/12/2023   CO2 26 12/12/2023   GLUCOSE 107 (H) 12/12/2023   BUN 14 12/12/2023   CREATININE 0.95 12/12/2023   BILITOT 0.5 12/12/2023   ALKPHOS 74 10/14/2016   AST 33 12/12/2023   ALT 39 (H) 12/12/2023   PROT 7.5 12/12/2023   ALBUMIN 4.4 10/14/2016   CALCIUM 9.4 12/12/2023   GFRAA 88 05/16/2020   QFTBGOLDPLUS NEGATIVE 09/01/2023    Speciality Comments: No specialty comments available.  Procedures:  No procedures performed Allergies: Patient has no known allergies.   Assessment / Plan:     Visit Diagnoses: Rheumatoid arthritis involving multiple sites with positive rheumatoid factor (HCC)-patient denies having a flare of rheumatoid arthritis since the last visit.  She has been taking Enbrel  on a regular basis.  No synovitis was noted on the examination today.  She continues to have some morning stiffness.  She has been off methotrexate  since December 2024.  We discussed possibly spacing Enbrel  to every 10 days in the future.  Patient would like to wait until the winter is over.  High risk medication use - Enbrel  Mini 50 mg/ml sq injections every 7 days. Discontinued methotrexate  in December 2024-do not plan to resume methotrexate  at  this time.  December 12, 2023 CBC and CMP were normal except ALT was mildly elevated at 39.  Will recheck labs in December and every 3 months to monitor for drug toxicity.  Information reimmunization was placed in the AVS.  She was advised to hold Enbrel  if she develops an infection resume until infection resolves.  Annual skin examination was advised to monitor for screening cancer.  Use of sunscreen and sun protection was discussed.  Elevated LFTs-mild elevation of LFTs.  Patient is cutting back on alcohol intake.  Ds DNA antibody positive-she has no clinical features of lupus.  Primary osteoarthritis of both hands-she has rheumatoid arthritis and osteoarthritis overlap with some stiffness in her hands.  No synovitis was noted on the examination.  Primary osteoarthritis of both feet-she denies discomfort.  Chronic midline low back pain without sciatica-she has intermittent discomfort in her lower back.  Currently asymptomatic.  Age-related osteoporosis without current pathological fracture - DEXA 03/13/2009 AP lumbar spine T score -3.1 with BMD of 0.707.  D/c fosamax due to SE. she takes calcium as needed.  Patient had menopause in her mid 19s.  She took hormones for about 1 year.  She has been taking calcium and vitamin D .  She walks almost daily but does not do any resistive exercises.  She had thoracic kyphosis.  I will schedule DEXA scan.  Other fatigue-she continues to have some fatigue.  Other kyphosis of thoracic region-she had no tenderness with the thoracic region.  Postmenopausal  History of vitamin D  deficiency-no recent vitamin D  values available.  History of hypertension  History of hyperlipidemia  History of pleural effusion /2009  History of gastroesophageal reflux (GERD)  Prediabetes  GAD (generalized anxiety disorder)  Primary insomnia  Orders: Orders Placed This Encounter  Procedures   DG Bone Density   Meds ordered this encounter  Medications    etanercept  (ENBREL  MINI) 50 MG/ML injection    Sig: Inject 1 mL (50 mg total) into the skin once a week.    Dispense:  12 mL    Refill:  0  Follow-Up Instructions: Return in about 5 months (around 05/19/2024) for Rheumatoid arthritis, Osteoarthritis.   Maya Nash, MD  Note - This record has been created using Animal nutritionist.  Chart creation errors have been sought, but may not always  have been located. Such creation errors do not reflect on  the standard of medical care.

## 2023-12-20 ENCOUNTER — Encounter: Payer: Self-pay | Admitting: Rheumatology

## 2023-12-20 ENCOUNTER — Other Ambulatory Visit: Payer: Self-pay

## 2023-12-20 ENCOUNTER — Ambulatory Visit: Attending: Rheumatology | Admitting: Rheumatology

## 2023-12-20 VITALS — BP 148/90 | HR 106 | Temp 98.1°F | Resp 14 | Ht 67.0 in | Wt 199.6 lb

## 2023-12-20 DIAGNOSIS — Z8739 Personal history of other diseases of the musculoskeletal system and connective tissue: Secondary | ICD-10-CM

## 2023-12-20 DIAGNOSIS — Z780 Asymptomatic menopausal state: Secondary | ICD-10-CM | POA: Insufficient documentation

## 2023-12-20 DIAGNOSIS — G8929 Other chronic pain: Secondary | ICD-10-CM | POA: Insufficient documentation

## 2023-12-20 DIAGNOSIS — M19042 Primary osteoarthritis, left hand: Secondary | ICD-10-CM | POA: Diagnosis present

## 2023-12-20 DIAGNOSIS — M19072 Primary osteoarthritis, left ankle and foot: Secondary | ICD-10-CM | POA: Insufficient documentation

## 2023-12-20 DIAGNOSIS — M0579 Rheumatoid arthritis with rheumatoid factor of multiple sites without organ or systems involvement: Secondary | ICD-10-CM | POA: Insufficient documentation

## 2023-12-20 DIAGNOSIS — M19041 Primary osteoarthritis, right hand: Secondary | ICD-10-CM | POA: Insufficient documentation

## 2023-12-20 DIAGNOSIS — R7689 Other specified abnormal immunological findings in serum: Secondary | ICD-10-CM | POA: Diagnosis not present

## 2023-12-20 DIAGNOSIS — M19071 Primary osteoarthritis, right ankle and foot: Secondary | ICD-10-CM | POA: Insufficient documentation

## 2023-12-20 DIAGNOSIS — F5101 Primary insomnia: Secondary | ICD-10-CM | POA: Insufficient documentation

## 2023-12-20 DIAGNOSIS — M810 Age-related osteoporosis without current pathological fracture: Secondary | ICD-10-CM | POA: Insufficient documentation

## 2023-12-20 DIAGNOSIS — R5383 Other fatigue: Secondary | ICD-10-CM | POA: Diagnosis present

## 2023-12-20 DIAGNOSIS — Z8679 Personal history of other diseases of the circulatory system: Secondary | ICD-10-CM | POA: Diagnosis present

## 2023-12-20 DIAGNOSIS — Z8639 Personal history of other endocrine, nutritional and metabolic disease: Secondary | ICD-10-CM | POA: Diagnosis present

## 2023-12-20 DIAGNOSIS — F411 Generalized anxiety disorder: Secondary | ICD-10-CM | POA: Diagnosis present

## 2023-12-20 DIAGNOSIS — R7989 Other specified abnormal findings of blood chemistry: Secondary | ICD-10-CM | POA: Insufficient documentation

## 2023-12-20 DIAGNOSIS — M545 Low back pain, unspecified: Secondary | ICD-10-CM | POA: Insufficient documentation

## 2023-12-20 DIAGNOSIS — Z8719 Personal history of other diseases of the digestive system: Secondary | ICD-10-CM | POA: Insufficient documentation

## 2023-12-20 DIAGNOSIS — R7303 Prediabetes: Secondary | ICD-10-CM | POA: Diagnosis present

## 2023-12-20 DIAGNOSIS — Z8709 Personal history of other diseases of the respiratory system: Secondary | ICD-10-CM | POA: Diagnosis present

## 2023-12-20 DIAGNOSIS — Z79899 Other long term (current) drug therapy: Secondary | ICD-10-CM | POA: Insufficient documentation

## 2023-12-20 DIAGNOSIS — M40294 Other kyphosis, thoracic region: Secondary | ICD-10-CM | POA: Insufficient documentation

## 2023-12-20 MED ORDER — ENBREL MINI 50 MG/ML ~~LOC~~ SOCT
50.0000 mg | SUBCUTANEOUS | 0 refills | Status: DC
  Filled 2023-12-20: qty 12, 84d supply, fill #0
  Filled 2023-12-29: qty 4, 28d supply, fill #0
  Filled 2024-01-30: qty 4, 28d supply, fill #1
  Filled 2024-02-21 – 2024-03-01 (×3): qty 4, 28d supply, fill #2

## 2023-12-20 NOTE — Patient Instructions (Signed)
 Standing Labs We placed an order today for your standing lab work.   Please have your standing labs drawn in December and every 3 months  Please have your labs drawn 2 weeks prior to your appointment so that the provider can discuss your lab results at your appointment, if possible.  Please note that you may see your imaging and lab results in MyChart before we have reviewed them. We will contact you once all results are reviewed. Please allow our office up to 72 hours to thoroughly review all of the results before contacting the office for clarification of your results.  WALK-IN LAB HOURS  Monday through Thursday from 8:00 am -12:30 pm and 1:00 pm-4:30 pm and Friday from 8:00 am-12:00 pm.  Patients with office visits requiring labs will be seen before walk-in labs.  You may encounter longer than normal wait times. Please allow additional time. Wait times may be shorter on  Monday and Thursday afternoons.  We do not book appointments for walk-in labs. We appreciate your patience and understanding with our staff.   Labs are drawn by [facility]. Please bring your co-pay at the time of your lab draw.  You may receive a bill from [facility] for your lab work.  Please note if you are on Hydroxychloroquine  and and an order has been placed for a Hydroxychloroquine  level,  you will need to have it drawn 4 hours or more after your last dose.  If you wish to have your labs drawn at another location, please call the office 24 hours in advance so we can fax the orders.  The office is located at [address], [place], [place] [address]   If you have any questions regarding directions or hours of operation,  please call [phone].   As a reminder, please drink plenty of water prior to coming for your lab work. Thanks!   Vaccines You are taking a medication(s) that can suppress your immune system.  The following immunizations are recommended: Flu annually Covid-19  RSV Td/Tdap (tetanus,  diphtheria, pertussis) every 10 years Pneumonia (Prevnar 15 then Pneumovax 23 at least 1 year apart.  Alternatively, can take Prevnar 20 without needing additional dose) Shingrix: 2 doses from 4 weeks to 6 months apart  Please check with your PCP to make sure you are up to date.   If you have signs or symptoms of an infection or start antibiotics: First, call your PCP for workup of your infection. Hold your medication through the infection, until you complete your antibiotics, and until symptoms resolve if you take the following: Injectable medication (Actemra, Benlysta, Cimzia, Cosentyx, Enbrel , Humira, Kevzara, Orencia, Remicade, Simponi, Stelara, Taltz, Tremfya) Methotrexate  Leflunomide (Arava) Mycophenolate (Cellcept) Earma, Olumiant, or Rinvoq  Please get an annual skin examination to screen for skin cancer.  Please use sunscreen and sun protection.

## 2023-12-21 ENCOUNTER — Other Ambulatory Visit (HOSPITAL_COMMUNITY): Payer: Self-pay

## 2023-12-22 ENCOUNTER — Ambulatory Visit: Admitting: Physician Assistant

## 2023-12-22 DIAGNOSIS — R5383 Other fatigue: Secondary | ICD-10-CM

## 2023-12-22 DIAGNOSIS — R7303 Prediabetes: Secondary | ICD-10-CM

## 2023-12-22 DIAGNOSIS — R7989 Other specified abnormal findings of blood chemistry: Secondary | ICD-10-CM

## 2023-12-22 DIAGNOSIS — Z8719 Personal history of other diseases of the digestive system: Secondary | ICD-10-CM

## 2023-12-22 DIAGNOSIS — Z8639 Personal history of other endocrine, nutritional and metabolic disease: Secondary | ICD-10-CM

## 2023-12-22 DIAGNOSIS — M19041 Primary osteoarthritis, right hand: Secondary | ICD-10-CM

## 2023-12-22 DIAGNOSIS — Z8739 Personal history of other diseases of the musculoskeletal system and connective tissue: Secondary | ICD-10-CM

## 2023-12-22 DIAGNOSIS — M0579 Rheumatoid arthritis with rheumatoid factor of multiple sites without organ or systems involvement: Secondary | ICD-10-CM

## 2023-12-22 DIAGNOSIS — Z111 Encounter for screening for respiratory tuberculosis: Secondary | ICD-10-CM

## 2023-12-22 DIAGNOSIS — Z79899 Other long term (current) drug therapy: Secondary | ICD-10-CM

## 2023-12-22 DIAGNOSIS — Z8709 Personal history of other diseases of the respiratory system: Secondary | ICD-10-CM

## 2023-12-22 DIAGNOSIS — M19071 Primary osteoarthritis, right ankle and foot: Secondary | ICD-10-CM

## 2023-12-22 DIAGNOSIS — F5101 Primary insomnia: Secondary | ICD-10-CM

## 2023-12-22 DIAGNOSIS — R768 Other specified abnormal immunological findings in serum: Secondary | ICD-10-CM

## 2023-12-22 DIAGNOSIS — Z8679 Personal history of other diseases of the circulatory system: Secondary | ICD-10-CM

## 2023-12-22 DIAGNOSIS — F411 Generalized anxiety disorder: Secondary | ICD-10-CM

## 2023-12-29 ENCOUNTER — Other Ambulatory Visit: Payer: Self-pay

## 2023-12-29 ENCOUNTER — Other Ambulatory Visit: Payer: Self-pay | Admitting: Pharmacy Technician

## 2023-12-29 NOTE — Progress Notes (Signed)
 Specialty Pharmacy Refill Coordination Note  Emma Warner is a 68 y.o. female contacted today regarding refills of specialty medication(s) Etanercept  (Enbrel  Mini)   Patient requested Delivery   Delivery date: 01/10/24   Verified address: [address]  [place]   Medication will be filled on 01/09/24.

## 2024-01-08 ENCOUNTER — Other Ambulatory Visit (HOSPITAL_COMMUNITY): Payer: Self-pay

## 2024-01-09 ENCOUNTER — Other Ambulatory Visit: Payer: Self-pay

## 2024-01-30 ENCOUNTER — Other Ambulatory Visit: Payer: Self-pay

## 2024-01-31 ENCOUNTER — Other Ambulatory Visit: Payer: Self-pay

## 2024-01-31 ENCOUNTER — Encounter (INDEPENDENT_AMBULATORY_CARE_PROVIDER_SITE_OTHER): Payer: Self-pay

## 2024-01-31 NOTE — Progress Notes (Signed)
 Specialty Pharmacy Refill Coordination Note  Emma Warner is a 68 y.o. female contacted today regarding refills of specialty medication(s) Etanercept  (Enbrel  Mini)   Patient requested (Patient-Rptd) Delivery   Delivery date: 02/02/24   Verified address: (Patient-Rptd) [address] [phone]   Medication will be filled on: 02/01/24

## 2024-02-21 ENCOUNTER — Other Ambulatory Visit (HOSPITAL_COMMUNITY): Payer: Self-pay

## 2024-02-24 ENCOUNTER — Other Ambulatory Visit: Payer: Self-pay

## 2024-02-28 ENCOUNTER — Other Ambulatory Visit (HOSPITAL_COMMUNITY): Payer: Self-pay

## 2024-03-01 ENCOUNTER — Other Ambulatory Visit: Payer: Self-pay

## 2024-03-01 ENCOUNTER — Other Ambulatory Visit (HOSPITAL_COMMUNITY): Payer: Self-pay

## 2024-03-01 NOTE — Progress Notes (Signed)
 Specialty Pharmacy Refill Coordination Note  Emma Warner is a 68 y.o. female contacted today regarding refills of specialty medication(s) Etanercept  (Enbrel  Mini)   Patient requested (Patient-Rptd) Delivery   Delivery date: 03/09/24   Verified address: (Patient-Rptd) [address] [phone]   Medication will be filled on: 03/07/24

## 2024-03-07 ENCOUNTER — Other Ambulatory Visit: Payer: Self-pay

## 2024-03-09 ENCOUNTER — Other Ambulatory Visit: Payer: Self-pay

## 2024-03-30 ENCOUNTER — Other Ambulatory Visit: Payer: Self-pay

## 2024-03-30 ENCOUNTER — Other Ambulatory Visit: Payer: Self-pay | Admitting: Rheumatology

## 2024-03-30 ENCOUNTER — Other Ambulatory Visit (HOSPITAL_COMMUNITY): Payer: Self-pay

## 2024-03-30 DIAGNOSIS — Z79899 Other long term (current) drug therapy: Secondary | ICD-10-CM

## 2024-03-30 DIAGNOSIS — M0579 Rheumatoid arthritis with rheumatoid factor of multiple sites without organ or systems involvement: Secondary | ICD-10-CM

## 2024-03-30 MED ORDER — ENBREL MINI 50 MG/ML ~~LOC~~ SOCT
50.0000 mg | SUBCUTANEOUS | 0 refills | Status: AC
  Filled 2024-03-30 – 2024-04-18 (×5): qty 4, 28d supply, fill #0

## 2024-03-30 NOTE — Telephone Encounter (Signed)
 Last Fill: 12/20/2023  Labs: 12/12/2023 CBC and CMP normal. Liver function is mildly elevated. Patient should avoid all NSAIDs and alcohol use.   TB Gold: 09/01/2023 negative    Next Visit: 05/23/2024  Last Visit: 12/20/2023  IK:Myzlfjunpi arthritis involving multiple sites with positive rheumatoid factor   Current Dose per office note on 12/20/2023: Enbrel  Mini 50 mg/ml sq injections every 7 days.   Attempted to contact patient and left message on machine to advise patient that she is due for labs. Standing orders are in place.   Okay to refill Enbrel ?

## 2024-04-03 ENCOUNTER — Other Ambulatory Visit: Payer: Self-pay

## 2024-04-03 ENCOUNTER — Other Ambulatory Visit: Payer: Self-pay | Admitting: Pharmacy Technician

## 2024-04-05 ENCOUNTER — Other Ambulatory Visit: Payer: Self-pay | Admitting: Rheumatology

## 2024-04-06 ENCOUNTER — Telehealth: Payer: Self-pay | Admitting: Pharmacist

## 2024-04-06 ENCOUNTER — Other Ambulatory Visit: Payer: Self-pay

## 2024-04-06 NOTE — Telephone Encounter (Signed)
 Last Fill: 12/08/2023  Next Visit: 05/23/2024  Last Visit: 12/20/2023  DX: GAD (generalized anxiety disorder)   Current Dose per office note on 12/20/2023: dose not mentioned   Okay to refill cymbalta ?

## 2024-04-06 NOTE — Telephone Encounter (Signed)
 Received message from [facility] pharmacy that patient's Enbrel  has increased with new year. IN 2025, she paid $2000 OOP max up front  Left VM requesting return call next week  Sherry Pennant, PharmD, MPH, BCPS, CPP Clinical Pharmacist

## 2024-04-09 ENCOUNTER — Other Ambulatory Visit: Payer: Self-pay

## 2024-04-11 ENCOUNTER — Other Ambulatory Visit (HOSPITAL_COMMUNITY): Payer: Self-pay

## 2024-04-11 NOTE — Telephone Encounter (Signed)
 Medication is currently queued up with plans to discuss copay with pt and will be routed back to us  if she is unwilling to pay copay.

## 2024-04-11 NOTE — Progress Notes (Signed)
 Pt paid max OOP of $2k last year, [facility] currently has fill queued up to dispense pending communication with pt.

## 2024-04-12 ENCOUNTER — Other Ambulatory Visit (HOSPITAL_COMMUNITY): Payer: Self-pay

## 2024-04-16 ENCOUNTER — Other Ambulatory Visit (HOSPITAL_COMMUNITY): Payer: Self-pay

## 2024-04-18 ENCOUNTER — Other Ambulatory Visit: Payer: Self-pay

## 2024-04-18 NOTE — Progress Notes (Signed)
 I called patient - she will call [facility] to enroll into Medicare Prescription Payment Plan. She will call today and call pharmacy back tomorrow to ask them to re-process claim.  Sherry Pennant, PharmD, MPH, BCPS, CPP Clinical Pharmacist

## 2024-04-18 NOTE — Telephone Encounter (Signed)
 I called patient - she will call [facility] today to enroll into Medicare Prescription Payment Plan. She will call today and call pharmacy back tomorrow to ask them to re-process claim.  Sherry Pennant, PharmD, MPH, BCPS, CPP Clinical Pharmacist

## 2024-04-18 NOTE — Progress Notes (Signed)
 Specialty Pharmacy Refill Coordination Note  Emma Warner is a 69 y.o. female contacted today regarding refills of specialty medication(s) Etanercept  (Enbrel  Mini)   Patient requested Delivery   Delivery date: 04/20/24   Verified address: [address] [address]   Medication will be filled on: 04/19/24

## 2024-04-19 ENCOUNTER — Other Ambulatory Visit: Payer: Self-pay

## 2024-05-23 ENCOUNTER — Ambulatory Visit: Admitting: Physician Assistant
